# Patient Record
Sex: Female | Born: 1935
Health system: Southern US, Community
[De-identification: ages and names within clinical notes are randomized; demographics above are authoritative.]

## PROBLEM LIST (undated history)

## (undated) DIAGNOSIS — R269 Unspecified abnormalities of gait and mobility: Principal | ICD-10-CM

## (undated) DIAGNOSIS — G9389 Other specified disorders of brain: Secondary | ICD-10-CM

## (undated) DIAGNOSIS — K219 Gastro-esophageal reflux disease without esophagitis: Secondary | ICD-10-CM

## (undated) DIAGNOSIS — E079 Disorder of thyroid, unspecified: Secondary | ICD-10-CM

## (undated) DIAGNOSIS — R32 Unspecified urinary incontinence: Secondary | ICD-10-CM

## (undated) DIAGNOSIS — I639 Cerebral infarction, unspecified: Secondary | ICD-10-CM

## (undated) DIAGNOSIS — R350 Frequency of micturition: Secondary | ICD-10-CM

## (undated) DIAGNOSIS — Z8673 Personal history of transient ischemic attack (TIA), and cerebral infarction without residual deficits: Secondary | ICD-10-CM

## (undated) DIAGNOSIS — M8580 Other specified disorders of bone density and structure, unspecified site: Secondary | ICD-10-CM

## (undated) DIAGNOSIS — I10 Essential (primary) hypertension: Secondary | ICD-10-CM

## (undated) HISTORY — DX: Gastro-esophageal reflux disease without esophagitis: K21.9

## (undated) HISTORY — DX: Other specified disorders of bone density and structure, unspecified site: M85.80

## (undated) HISTORY — DX: Unspecified urinary incontinence: R32

## (undated) HISTORY — PX: CHOLECYSTECTOMY: SHX55

## (undated) HISTORY — DX: Personal history of transient ischemic attack (TIA), and cerebral infarction without residual deficits: Z86.73

## (undated) HISTORY — DX: Frequency of micturition: R35.0

## (undated) HISTORY — DX: Unspecified abnormalities of gait and mobility: R26.9

## (undated) HISTORY — PX: TONSILLECTOMY: SUR1361

## (undated) HISTORY — PX: ABDOMINAL HYSTERECTOMY: SHX81

## (undated) HISTORY — DX: Disorder of thyroid, unspecified: E07.9

## (undated) HISTORY — PX: URETHRAL SLING: SHX2621

## (undated) HISTORY — DX: Other specified disorders of brain: G93.89

## (undated) HISTORY — PX: ESOPHAGUS SURGERY: SHX626

## (undated) HISTORY — PX: BLADDER SURGERY: SHX569

## (undated) HISTORY — DX: Cerebral infarction, unspecified: I63.9

## (undated) HISTORY — PX: BACK SURGERY: SHX140

## (undated) HISTORY — DX: Essential (primary) hypertension: I10

## (undated) HISTORY — PX: APPENDECTOMY: SHX54

---

## 1999-07-01 ENCOUNTER — Encounter: Admission: RE | Admit: 1999-07-01 | Discharge: 1999-07-01 | Payer: Self-pay | Admitting: Orthopedic Surgery

## 1999-07-01 ENCOUNTER — Encounter: Payer: Self-pay | Admitting: Orthopedic Surgery

## 1999-10-26 ENCOUNTER — Encounter: Admission: RE | Admit: 1999-10-26 | Discharge: 1999-10-26 | Payer: Self-pay | Admitting: Internal Medicine

## 1999-10-26 ENCOUNTER — Encounter: Payer: Self-pay | Admitting: Internal Medicine

## 2000-05-11 ENCOUNTER — Encounter: Payer: Self-pay | Admitting: Internal Medicine

## 2000-05-11 ENCOUNTER — Encounter: Admission: RE | Admit: 2000-05-11 | Discharge: 2000-05-11 | Payer: Self-pay | Admitting: Internal Medicine

## 2000-10-29 ENCOUNTER — Encounter: Payer: Self-pay | Admitting: Internal Medicine

## 2000-10-29 ENCOUNTER — Encounter: Admission: RE | Admit: 2000-10-29 | Discharge: 2000-10-29 | Payer: Self-pay | Admitting: Internal Medicine

## 2001-07-02 ENCOUNTER — Encounter: Admission: RE | Admit: 2001-07-02 | Discharge: 2001-07-02 | Payer: Self-pay | Admitting: Internal Medicine

## 2001-07-02 ENCOUNTER — Encounter: Payer: Self-pay | Admitting: Internal Medicine

## 2001-07-04 ENCOUNTER — Encounter: Admission: RE | Admit: 2001-07-04 | Discharge: 2001-07-04 | Payer: Self-pay | Admitting: Internal Medicine

## 2001-07-04 ENCOUNTER — Encounter: Payer: Self-pay | Admitting: Internal Medicine

## 2001-08-09 ENCOUNTER — Encounter (INDEPENDENT_AMBULATORY_CARE_PROVIDER_SITE_OTHER): Payer: Self-pay

## 2001-08-09 ENCOUNTER — Ambulatory Visit (HOSPITAL_COMMUNITY): Admission: RE | Admit: 2001-08-09 | Discharge: 2001-08-09 | Payer: Self-pay | Admitting: Gastroenterology

## 2001-09-10 ENCOUNTER — Ambulatory Visit (HOSPITAL_COMMUNITY): Admission: RE | Admit: 2001-09-10 | Discharge: 2001-09-10 | Payer: Self-pay | Admitting: Interventional Cardiology

## 2001-09-11 ENCOUNTER — Encounter: Payer: Self-pay | Admitting: Thoracic Surgery

## 2001-09-13 ENCOUNTER — Encounter (INDEPENDENT_AMBULATORY_CARE_PROVIDER_SITE_OTHER): Payer: Self-pay | Admitting: Specialist

## 2001-09-13 ENCOUNTER — Encounter: Payer: Self-pay | Admitting: Thoracic Surgery

## 2001-09-13 ENCOUNTER — Inpatient Hospital Stay (HOSPITAL_COMMUNITY): Admission: RE | Admit: 2001-09-13 | Discharge: 2001-09-18 | Payer: Self-pay | Admitting: Thoracic Surgery

## 2001-09-14 ENCOUNTER — Encounter: Payer: Self-pay | Admitting: Thoracic Surgery

## 2001-09-15 ENCOUNTER — Encounter: Payer: Self-pay | Admitting: Thoracic Surgery

## 2001-09-16 ENCOUNTER — Encounter: Payer: Self-pay | Admitting: Thoracic Surgery

## 2001-09-17 ENCOUNTER — Encounter: Payer: Self-pay | Admitting: Thoracic Surgery

## 2001-09-18 ENCOUNTER — Encounter: Payer: Self-pay | Admitting: Thoracic Surgery

## 2001-09-25 ENCOUNTER — Encounter: Admission: RE | Admit: 2001-09-25 | Discharge: 2001-09-25 | Payer: Self-pay | Admitting: Thoracic Surgery

## 2001-09-25 ENCOUNTER — Encounter: Payer: Self-pay | Admitting: Thoracic Surgery

## 2001-10-23 ENCOUNTER — Encounter: Admission: RE | Admit: 2001-10-23 | Discharge: 2001-10-23 | Payer: Self-pay | Admitting: Thoracic Surgery

## 2001-10-23 ENCOUNTER — Encounter: Payer: Self-pay | Admitting: Thoracic Surgery

## 2001-11-13 ENCOUNTER — Encounter: Admission: RE | Admit: 2001-11-13 | Discharge: 2001-11-13 | Payer: Self-pay | Admitting: Thoracic Surgery

## 2001-11-13 ENCOUNTER — Encounter: Payer: Self-pay | Admitting: Thoracic Surgery

## 2001-11-27 ENCOUNTER — Encounter: Payer: Self-pay | Admitting: Internal Medicine

## 2001-11-27 ENCOUNTER — Encounter: Admission: RE | Admit: 2001-11-27 | Discharge: 2001-11-27 | Payer: Self-pay | Admitting: Internal Medicine

## 2002-04-17 ENCOUNTER — Encounter: Payer: Self-pay | Admitting: Internal Medicine

## 2002-04-17 ENCOUNTER — Encounter: Admission: RE | Admit: 2002-04-17 | Discharge: 2002-04-17 | Payer: Self-pay | Admitting: Internal Medicine

## 2002-12-04 ENCOUNTER — Encounter: Payer: Self-pay | Admitting: Internal Medicine

## 2002-12-04 ENCOUNTER — Encounter: Admission: RE | Admit: 2002-12-04 | Discharge: 2002-12-04 | Payer: Self-pay | Admitting: Internal Medicine

## 2003-10-20 ENCOUNTER — Encounter: Admission: RE | Admit: 2003-10-20 | Discharge: 2003-10-20 | Payer: Self-pay | Admitting: Internal Medicine

## 2003-12-14 ENCOUNTER — Encounter: Admission: RE | Admit: 2003-12-14 | Discharge: 2003-12-14 | Payer: Self-pay | Admitting: Internal Medicine

## 2004-11-09 ENCOUNTER — Encounter: Admission: RE | Admit: 2004-11-09 | Discharge: 2004-11-09 | Payer: Self-pay | Admitting: Internal Medicine

## 2004-11-22 ENCOUNTER — Ambulatory Visit: Payer: Self-pay | Admitting: Critical Care Medicine

## 2004-11-24 ENCOUNTER — Ambulatory Visit: Payer: Self-pay | Admitting: Critical Care Medicine

## 2004-11-24 ENCOUNTER — Ambulatory Visit: Admission: RE | Admit: 2004-11-24 | Discharge: 2004-11-24 | Payer: Self-pay | Admitting: Critical Care Medicine

## 2004-11-24 ENCOUNTER — Encounter (INDEPENDENT_AMBULATORY_CARE_PROVIDER_SITE_OTHER): Payer: Self-pay | Admitting: *Deleted

## 2004-12-07 ENCOUNTER — Ambulatory Visit: Payer: Self-pay | Admitting: Critical Care Medicine

## 2005-01-06 ENCOUNTER — Ambulatory Visit: Payer: Self-pay | Admitting: Critical Care Medicine

## 2005-02-07 ENCOUNTER — Ambulatory Visit (HOSPITAL_COMMUNITY): Admission: RE | Admit: 2005-02-07 | Discharge: 2005-02-07 | Payer: Self-pay | Admitting: Critical Care Medicine

## 2005-02-14 ENCOUNTER — Encounter: Admission: RE | Admit: 2005-02-14 | Discharge: 2005-02-14 | Payer: Self-pay | Admitting: Internal Medicine

## 2005-03-07 ENCOUNTER — Ambulatory Visit: Payer: Self-pay | Admitting: Critical Care Medicine

## 2006-02-15 ENCOUNTER — Encounter: Admission: RE | Admit: 2006-02-15 | Discharge: 2006-02-15 | Payer: Self-pay | Admitting: Internal Medicine

## 2007-03-26 ENCOUNTER — Encounter: Admission: RE | Admit: 2007-03-26 | Discharge: 2007-03-26 | Payer: Self-pay | Admitting: Internal Medicine

## 2007-04-08 ENCOUNTER — Encounter: Admission: RE | Admit: 2007-04-08 | Discharge: 2007-04-08 | Payer: Self-pay | Admitting: Internal Medicine

## 2007-10-28 ENCOUNTER — Ambulatory Visit (HOSPITAL_COMMUNITY): Admission: RE | Admit: 2007-10-28 | Discharge: 2007-10-28 | Payer: Self-pay | Admitting: Gastroenterology

## 2007-10-28 ENCOUNTER — Encounter (INDEPENDENT_AMBULATORY_CARE_PROVIDER_SITE_OTHER): Payer: Self-pay | Admitting: Diagnostic Radiology

## 2008-02-13 ENCOUNTER — Inpatient Hospital Stay (HOSPITAL_COMMUNITY): Admission: RE | Admit: 2008-02-13 | Discharge: 2008-02-14 | Payer: Self-pay | Admitting: Obstetrics and Gynecology

## 2008-04-02 ENCOUNTER — Encounter: Admission: RE | Admit: 2008-04-02 | Discharge: 2008-04-02 | Payer: Self-pay | Admitting: Internal Medicine

## 2008-08-21 HISTORY — PX: BLADDER SURGERY: SHX569

## 2008-08-21 HISTORY — PX: URETHRAL SLING: SHX2621

## 2009-04-05 ENCOUNTER — Encounter: Admission: RE | Admit: 2009-04-05 | Discharge: 2009-04-05 | Payer: Self-pay | Admitting: Internal Medicine

## 2009-07-21 ENCOUNTER — Ambulatory Visit (HOSPITAL_BASED_OUTPATIENT_CLINIC_OR_DEPARTMENT_OTHER): Admission: RE | Admit: 2009-07-21 | Discharge: 2009-07-21 | Payer: Self-pay | Admitting: Urology

## 2010-03-10 ENCOUNTER — Encounter: Admission: RE | Admit: 2010-03-10 | Discharge: 2010-03-10 | Payer: Self-pay | Admitting: Internal Medicine

## 2010-04-27 ENCOUNTER — Encounter: Admission: RE | Admit: 2010-04-27 | Discharge: 2010-04-27 | Payer: Self-pay | Admitting: Internal Medicine

## 2010-09-11 ENCOUNTER — Encounter: Payer: Self-pay | Admitting: Family Medicine

## 2010-11-22 LAB — POCT I-STAT 4, (NA,K, GLUC, HGB,HCT): Potassium: 4 mEq/L (ref 3.5–5.1)

## 2011-01-03 NOTE — Op Note (Signed)
Margaret Francis, Margaret Francis                ACCOUNT NO.:  [id number]   MEDICAL RECORD NO.:  [id number]          PATIENT TYPE:  INP   LOCATION:  9305                          FACILITY:  [facility]   PHYSICIAN:  Charles A. Delcambre, MDDATE OF BIRTH:  01/12/1930   DATE OF PROCEDURE:  02/13/2008  DATE OF DISCHARGE:                               OPERATIVE REPORT   PREOPERATIVE DIAGNOSES:  1. Cystocele.  2. Rectocele.   POSTOPERATIVE DIAGNOSES:  1. Cystocele.  2. Rectocele.   PROCEDURE:  Anterior and posterior repair.   SURGEON:  Charles A. Sydnee Cabal, MD   ASSISTANT:  Gerald Leitz, MD   COMPLICATIONS:  None.   ESTIMATED BLOOD LOSS:  150 mL.   ANESTHESIA:  General.   FINDINGS:  Large cystocele and large rectocele noted.   Instrument, sponge, and needle count correct x2.   DESCRIPTION OF PROCEDURE:  The patient was taken to the operating room  and anesthesia was given and then was adequate.  She was then placed in  dorsal lithotomy position in universal stirrups.  Sterile prep and drape  was undertaken.  Cuff was isolated with an Allis clamps and pulled down  retraction was used.  The cuff was scored with a knife.  Vaginal mucosa  was then dissected free of the cystocele with some sharp dissection with  a knife and Metzenbaum scissors and some blunt dissection with Ray-Tec  on the finger.  Adequate exposure was noted, 2-0 Vicryl sutures and a  pubourethral ligament were placed.  Successive sutures x5 were placed  inferior to the urethral meatus down towards the apex of the vagina.  These were plicated and yielded very good support of the cystocele as  the cystocele was reduced tying the sutures down, excess vaginal mucosa  was excised, running locking 2-0 Vicryl was then used to close the  vaginal mucosa.  Attention was then turned to the posterior repair.  A  wedge diamond-shaped tissue was excised from the perineal body.  This  allowed exposure of the vaginal mucosa overlying the rectum.   Large  rectocele was encountered with the septum being very thin.  Careful  dissection with a knife and Metzenbaum scissors and some blunt  dissection with the Ray-Tec over the finger yielded good exposure.  Four  2-0 Vicryl sutures were used to plicate the perirectal fascia to the  midline.  Hemostasis was good and structural support was adequately  obtained.  Excess vaginal mucosa was excised as the patient is not  sexually active.  The vaginal  mucosa was closed with running locking 2-0 Vicryl.  Hemostasis was  adequate.  After a figure-of-eight suture was placed at the introitus, a  pack of 1-inch gauze was done with Estrace cream.  The patient was taken  to recovery with physician in attendance having tolerated the procedure  well.      Charles A. Sydnee Cabal, MD  Electronically Signed     CAD/MEDQ  D:  02/13/2008  T:  02/14/2008  Job:  [id number]

## 2011-01-06 NOTE — H&P (Signed)
[place]. [facility]  Patient:    Margaret Francis, Margaret Francis Visit Number: [id number] MRN: [id number]          Service Type: SUR Location: 3300 3310 01 Attending Physician:  Cameron Proud Dictated by:   Sherrie George, P.A. Admit Date:  09/13/2001                           History and Physical  REFERRING PHYSICIAN:  Dr. Barbette Hair. Vaughan Basta.  CHIEF COMPLAINT:  Leiomyoma of the esophagus.  BRIEF HISTORY:  Patient is a 77 year old white female with a history of gastroesophageal reflux disease and chronic chest discomfort.  She underwent chest CT which showed a 2.4-cm density in the left posterior mediastinum which was thought to be an esophageal or bronchogenic cyst.  Endoscopy showed a 3-cm tumor in the midesophagus consistent with a leiomyoma or a lipoma.  She was referred to [facility] and Dr. Algis Downs. Karle Plumber and it was his opinion she should undergo resection of this leiomyoma.  The risks and benefits were discussed and she is admitted for the same at this time.  ALLERGIES:  She has taken MORPHINE and DEMEROL in relation to in-hospital procedures; both of them made her violently sick with vomiting.  PRESENT MEDICATIONS: 1. Verapamil 120 mg one q.d. h.s. 2. Synthroid 50 mcg q.d. 3. Nexium 40 mg q.d. 4. Premarin 1.25 mg q.d. 5. Aspirin 81 mg q.d.  PAST HISTORY:  She had a hysterectomy 20 years ago, cholecystectomy in 1990s, appendectomy in 1950s, tonsils in 1960s, hypertension for 10 to 15 years.  She has a history of being hypothyroid.  She had a cardiac catheterization, September 10, 2001, by Dr. Darci Needle; those results are not currently available.  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 Diet:  She is on a regular diet.  Cigarettes:  She smoked less than a pack a day for four to five years during college, none since.  ETOH:  None.  FAMILY HISTORY:  Mother died at 47 with diabetes and heart failure.   Father died at age 33 with rheumatoid arthritis and heart failure.  She has one sister with breast cancer.  There is also a history of diabetes and heart disease within her family.  REVIEW OF SYSTEMS:  Weight is stable.  Left hand gets numb secondary to a bulging disk/spurs in her neck.  She sleeps on three pillows secondary to her reflux.  She denies any cardiac symptoms, GI symptoms, GU symptoms.  Left hip is "bad" and is being followed by Dr. Ollen Gross.  PHYSICAL EXAMINATION:  GENERAL:  This is a well-nourished, well-developed white female in no acute distress.  VITAL SIGNS:  Pulse is 84 and regular.  Blood pressure is 170/90 in the right and 170/92 in the left.  HEENT:  Head:  Normocephalic.  Eyes:  PERLA, EOMs intact.  Fundi not visualized.  Ears, nose, throat and mouth:  Grossly within normal limits.  The left TM could not be seen secondary to cerumen.  NECK:  No bruits, no lymphadenopathy and no thyromegaly.  CHEST:  Clear to auscultation and percussion bilaterally.  HEART:  No murmurs, rubs, or gallops.  Pulses are +2 and equal bilaterally throughout their distribution.  ABDOMEN:  Soft, nontender.  Positive bowel sounds.  No palpable organomegaly.  GU/RECTAL:  Not performed, not medically indicated.  EXTREMITIES:  No clubbing, cyanosis, or edema.  NEUROLOGIC:  No focal deficits.  IMPRESSION: 1. Leiomyoma of the esophagus. 2. Hypertension. 3. Hypothyroidism. 4. Chronic gastroesophageal reflux disease.  PLAN:  Right thoracotomy, VATS resection of leiomyoma. Dictated by:   Sherrie George, P.A. Attending Physician:  Cameron Proud DD:  09/11/01 TD:  09/12/01 Job: [id number]

## 2011-01-06 NOTE — Cardiovascular Report (Signed)
[place]. [facility]  Patient:    Margaret Francis, Margaret Francis Visit Number: [id number] MRN: [id number]          Service Type: CAT Location: [facility] 2899 12 Attending Physician:  Lyn Records. Iii Dictated by:   Darci Needle, M.D. Proc. Date: 09/10/01 Admit Date:  09/10/2001   CC:         Barbette Hair. Vaughan Basta., M.D.  D. Karle Plumber, M.D.   Cardiac Catheterization  INDICATIONS:  The patient is 76 and has been having various types of chest discomfort.  She recently underwent a pre-esophageal surgery Cardiolite stress test that demonstrated inferolateral/apical ischemia.  Because of this abnormality, coronary angiography was felt to be indicated prior to esophageal surgery for an esophageal lipoma.  PROCEDURE PERFORMED: 1. Left heart catheterization. 2. Selective coronary angiography. 3. Left ventriculography.  DESCRIPTION OF PROCEDURE:  After informed consent, a #6 French sheath was inserted into the right femoral artery using the modified Seldinger technique. A #6 French A2 Multipurpose catheter was used for hemodynamic recordings, left ventriculography, and selective left and right coronary angiography.  No complications occurred during the procedure.  A sheathogram was performed in the right femoral artery revealing a sheath.  Perclose was applied successfully.  RESULTS:    I. Left ventriculography:  The left ventricle was normal in size and      demonstrates normal overall contractility.  The ejection fraction is      estimated to be 65%.  The patient had no mitral regurgitation.  II. Hemodynamic data      A. Aortic pressure 169/79.      B. Left ventricular pressure 170/12. III. Selective coronary angiography      A. Left main coronary:  Normal.      B. Left anterior descending coronary:  Normal.      C. Circumflex artery:  Normal.      D. Right coronary:  Normal.  The coronary arterial blood supply distribution is normal with the LAD  being dominant wrapping around the left ventricular apex.  It gives origin to several small diagonal branches.  There is a very large first obtuse marginal branch that arises from the circumflex that also supplies much of the anterolateral wall.  The distal circumflex bifurcates and is also normal.  CONCLUSIONS: 1. Essentially normal coronaries with only minimal luminal irregularities    noted in the LAD. 2. Normal left ventricular function. 3. Successful Perclose.  PLAN:  No specific cardiovascular therapy is indicated.  It is doubtful the patients chest discomfort is cardiac in origin.  She is cleared for esophageal surgery. Dictated by:   Darci Needle, M.D. Attending Physician:  Lyn Records. Iii DD:  09/10/01 TD:  09/10/01 Job: [id number]

## 2011-01-06 NOTE — Op Note (Signed)
Margaret Francis, Margaret Francis                ACCOUNT NO.:  [id number]   MEDICAL RECORD NO.:  [id number]          PATIENT TYPE:  AMB   LOCATION:  CARD                         FACILITY:  [facility]   PHYSICIAN:  Shan Levans, M.D. LHCDATE OF BIRTH:  03-27-1930   DATE OF PROCEDURE:  11/24/2004  DATE OF DISCHARGE:                                 OPERATIVE REPORT   PROCEDURE:  Bronchoscopy.   SURGEON:  Shan Levans, M.D.   INDICATIONS FOR PROCEDURE:  Chronic aspiration with right lower lobe  cavitary infiltrate.   ANESTHESIA:  1% Xylocaine local.   PREOP MEDICATION:  Fentanyl 50 mcg IV push, Versed 3 mg IV push.   DESCRIPTION OF PROCEDURE:  The Pentax video bronchoscope was introduced in  the right nares, the upper airways were visualized and were unremarkable.  The entire tracheobronchial tree was visualized and revealed diffuse  tracheobronchitis with significant airway inflammation and purulence but no  evidence of significant endobronchial lesion. Attention was then paid to the  right lower lobe superior segment, __________ protective brushings were  obtained, bronchioalveolar lavage was obtained, 150 mL infused, 75 mL  returned. Transbronchial biopsies x5 were obtained.   COMPLICATIONS:  None.   IMPRESSION:  Probable aspiration pneumonia with cavitary pneumonitis. Rule  out fungal infection. Rule out malignancy.   RECOMMENDATIONS:  Followup microbiologic data, pathology as well.      PW/MEDQ  D:  11/24/2004  T:  11/24/2004  Job:  [id number]   cc:   Ike Bene, M.D.  [address]  [place]  [place] [address]  Fax: [phone]

## 2011-01-06 NOTE — H&P (Signed)
[place]. [facility]  Patient:    Margaret Francis, Margaret Francis Visit Number: [id number] MRN: [id number]          Service Type: SUR Location: 3300 3310 01 Attending Physician:  Cameron Proud Dictated by:   Anselm Lis, N.P. Admit Date:  09/13/2001                           History and Physical  DATE OF BIRTH:  06-May-1930  PRIMARY CARE [place]:  Dr. Barbette Hair. Vaughan Basta.  DATE OF PROCEDURE:  September 10, 2001  IMPRESSION: 1. Atypical chest pain; followup Cardiolite was abnormal and suggestive of    ischemia.  She was counseled for coronary angiography and possible    percutaneous coronary intervention.  Cardiac risk factors include    hypertension, age and positive family history for coronary artery disease    (late in life). 2. Hypothyroidism; supplemented. 3. Hypertension for many years. 4. Benign esophageal tumor (lipoma), scheduled to be resected by    Dr. Norton Blizzard, Friday, January 24th, toward preventing eventual    esophageal obstruction from continued growth. 5. History of gastroesophageal reflux disease; esophagogastroduodenoscopy,    December 2002, revealed gastric atrophy and evidence of esophageal lipoma.    Helicobacter pylori negative.  She is on Nexium. 6. History of back surgery x2 (1970s, repair of ruptured lumbar disks).  She    currently has bulging cervical disk/cervical bone spurs, ? cause of left    arm radiculopathy (intermittent left arm/hand numbness).  PLAN:  Patient was counseled to undergo and accepted plans for coronary angiography, left ventriculogram and possible percutaneous intervention if indicated and able.  Risks, potential complications such as risk of MI, coronary artery perforation, emergent CABG, stroke, bleeding/bruising in groin site; benefits and alternatives reviewed.  Patient and her husband indicate their questions and concerns have been addressed and are agreeable to proceed.  HISTORY OF  PRESENT ILLNESS:  Margaret Francis is a pleasant 77 year old with history of hypertension, positive family history of late-in-life CAD, who has had a six-month history of almost constant (90% of the time) lower substernal/epigastric dull pain, hard achy discomfort radiating to her back. No exacerbating or relieving factors.  EGD, December 2002, was unrevealing, though did identify a lipoma that is due to be resected this Friday, September 13, 2001, by Dr. Edwyna Shell.  Patients discomfort waxes/wanes.  No relation to exertion; is non-pleuritic nor reproduced with applied pressure. Workup has included adenosine Cardiolite suspicious for ischemia.  She was counseled for coronary angiography and possible percutaneous intervention.  PREVIOUS SURGICAL HISTORY:  Repair or deviated nasal septum, lumbar back surgery in 1970s x2 (repair of ruptured disks), appendectomy, cholecystectomy, tonsillectomy, hysterectomy with BSO, left knee arthroscopic repair of ruptured ligaments.  ALLERGIES:  To MORPHINE and DEMEROL causing emesis.  MEDICATIONS: 1. Verapamil 120 mg q.h.s. 2. Baby aspirin (on hold pending surgery this Friday). 3. Nexium 40 mg p.o. q.a.m. 4. Drixoral (over-the-counter allergy medication) q.d.  SOCIAL HISTORY AND HABITS:  ETOH/tobacco:  Negative.  Caffeine:  Negative. Patient is retired Psychologist, forensic of business for 30 years.  She is married, for 48 years, without children.  FAMILY HISTORY:  Mother died at age 63, ? heart failure.  Dad died at age 67, had rheumatoid arthritis.  Patient has two sisters alive and well, older sister is status post mastectomy for breast cancer.  Maternal grandfather died at age 77 of MI, maternal aunt had  heart disease/defibrillator.  REVIEW OF SYSTEMS:  As in HPI/previous medical history, otherwise, denies problems with hard of hearing, wears trifocals.  Negative dysphagia to food or fluid.  Has a postnasal drip.  Negative constipation, diarrhea, bright  red blood PR, dysuria nor hematuria.  Does have a "bad left hip" which prevents her from running.  Negative pedal edema, DOE, orthopnea nor PND.  PHYSICAL EXAMINATION:  VITAL SIGNS:  Blood pressure 157/85, heart rate 72 and regular, respiratory rate 16, temperature 98.1.  Height 5 feet 7 inches.  Weight 165 pounds.  GENERAL:  She is a well-nourished, older female in no apparent distress.  Her husband is in attendance.  HEENT/NECK:  Brisk bilateral carotid upstrokes without bruit.  No significant JVD nor thyromegaly.  CHEST:  Lung sounds clear with equal bilateral excursion.  Negative CVA tenderness.  CARDIAC:  Regular rate and rhythm without murmur, rub nor gallop.  ABDOMEN:  Thin, soft, nondistended, normoactive bowel sounds.  Negative abdominal aortic, renal nor femoral bruit.  No masses nor organomegaly identified.  EXTREMITIES:  Distal pulses intact.  Negative pedal edema.  NEUROLOGIC:  Cranial nerves II-XII grossly intact.  Alert and oriented x3.  GENITAL:  Deferred.  RECTAL:  Deferred.  LABORATORY AND ACCESSORY DATA:  Lab work from September 09, 2001 revealed sodium of 137, potassium 5.2, chloride 101, CO2 27, BUN of 10, creatinine 0.8, glucose 96; LFTs within normal range; hemoglobin 12.6 (was 14.7 on August 29, 2001), hematocrit of 37.2, WBC 5.9, platelets are 229,000; pro time of 10.9, INR of 0.85, PTT of 35.  Chest x-ray from April 16, 2001 revealed no active disease.  EKG revealed 70 beats per minute, T-wave abnormalities in aVF, V4 through V6. Dictated by:   Anselm Lis, N.P. Attending Physician:  Cameron Proud DD:  09/10/01 TD:  09/10/01 Job: [id number]

## 2011-01-06 NOTE — Discharge Summary (Signed)
[place]. [facility]  Patient:    Margaret Francis, Margaret Francis Visit Number: [id number] MRN: [id number]          Service Type: SUR Location: 3300 3310 01 Attending Physician:  Cameron Proud Dictated by:   Areta Haber, P.A.-C Admit Date:  09/13/2001 Disc. Date: 09/18/01   CC:         Norton Blizzard, M.D.  Barbette Hair. Vaughan Basta., M.D.   Discharge Summary  HISTORY OF PRESENT ILLNESS:  This is a 77 year old white female with a history of gastroesophageal reflux disease and chronic chest discomfort.  She underwent a chest CT scan which showed a 2.4 cm density in the left posterior mediastinum which was thought to be possibly an esophageal or bronchiogenic cyst.  Endoscopic examination showed a 3 cm tumor in the mid esophagus consistent with a leiomyoma or a lipoma.  She was referred to Dr. Algis Downs. Karle Plumber for a surgical opinion, and he admitted her this hospitalization for elective resection of the lesion.  ALLERGIES:  She has taken MORPHINE and DEMEROL in relation to in-hospital procedures.  Both of them reportedly made her violently sick with vomiting.  CURRENT MEDICATIONS: 1. Verapamil 120 mg q.d. h.s. 2. Synthroid 50 mcg q.d. 3. Nexium 40 mg q.d. 4. Premarin 1.25 mg q.d. 5. Aspirin 81 mg q.d.  PAST MEDICAL HISTORY: 1. Hysterectomy. 2. Cholecystectomy. 3. Appendectomy. 4. Tonsillectomy. 5. History of hypertension. 6. History of hypothyroidism. 7. History of recent cardiac catheterization on September 10, 2001.  Results    currently unavailable to the chart.  SOCIAL HISTORY:  Please see the history and physical done at the time of admission.  FAMILY HISTORY:  Please see the history and physical done at the time of admission.  REVIEW OF SYSTEMS:  Please see the history and physical done at the time of admission.  PHYSICAL EXAMINATION:  Please see the history and physical done at the time of admission.  HOSPITAL COURSE:  The  patient was admitted electively on 09/13/01.  Taken to the operating room where she underwent a right video assisted thoracoscopy with resection of an esophageal duplication cyst.  The procedure was performed by Dr. Algis Downs. Karle Plumber.  The patient tolerated the procedure well, was taken to the postanesthesia care unit in a stable condition.  Additionally it was noted the patient had a removal of a skin nevus at that time, and a lymph node dissection.  POSTOPERATIVE HOSPITAL COURSE:  The patient has done well.  She has been restarted on her routine medications.  She has tolerated routine advancement and activity.  Commenced her for the level of postoperative convalescence using routine advancement protocols.  Laboratory values have been stable postoperatively.  Hemoglobin and hematocrit dated 09/16/01, were 12 and 35, respectively.  She had her chest tubes removed in a routine manner without problem.  Her chest x-rays were evaluated daily, and are felt to be stable and improving.  She has been weaned from oxygen and maintains good saturations on room air.  She has had a post-procedure Gastrografin swallow which has shown no extravasation of the dye.  She is showing good signs of healing in regards to her incisions without signs of infection.  She is overall felt to be tentatively stable for discharge in the morning of 09/18/01.  FINAL DIAGNOSES: 1. Esophageal cyst, benign cyst line by respiratory type epithelium and    associated histiocytic reaction. 2. Lymph node level 8 biopsy benign with sinus histiocytosis and anthracotic  pigment. 3. Lymph node level #8 number 2, biopsy benign lymph node with sinus    histiocytosis. 4. Skin biopsy of skin nevus showed melanocytic nevus.  DISCHARGE MEDICATIONS:  As preoperatively for pain, Ultram one or two q.6h. p.r.n.  DISCHARGE INSTRUCTIONS:  The patient received written instructions in regards to medications, activity, diet, wound care, and  followup.  FOLLOWUP:  Dr. Edwyna Shell in one week with a chest x-ray at that time.  FINAL DIAGNOSES:  Benign esophageal cyst.  OTHER DIAGNOSES:  As previously listed. 1. Previous hysterectomy. 2. Cholecystectomy. 3. Appendectomy. 4. Tonsillectomy. 5. History of hypertension. 6. History of hypothyroidism. 7. History of gastroesophageal reflux. Dictated by:   Areta Haber, P.A.-C Attending Physician:  Cameron Proud DD:  09/17/01 TD:  09/17/01 Job: [id number]

## 2011-01-06 NOTE — Op Note (Signed)
[place]. [facility]  Patient:    Margaret Francis, Margaret Francis Visit Number: [id number] MRN: [id number]          Service Type: SUR Location: 3300 3310 01 Attending Physician:  Cameron Proud Dictated by:   D. Karle Plumber, M.D. Proc. Date: 09/13/01 Admit Date:  09/13/2001   CC:         Genene Churn. Sherin Quarry, M.D.             Barbette Hair. Vaughan Basta., M.D.                           Operative Report  PREOPERATIVE DIAGNOSIS:  Mid esophageal mass.  POSTOPERATIVE DIAGNOSIS:  Esophageal duplication cyst mid esophagus.  PROCEDURE:  Right VATS ______ of esophageal duplication cyst.  SURGEON:  D. Karle Plumber, M.D.  ASSISTANT:  Tollie Pizza. Collins, P.A.-C.  ANESTHESIA:  General.  DESCRIPTION OF PROCEDURE:  After percutaneous insertion of all monitor lines the patient under general anesthesia was prepped and draped in the usual sterile manner.  She was turned to the right lateral thoracotomy position.  Two trocar sites were made in the anterior and posterior axial line at the 7th intercostal space.  Two trocars were insert.  Through the anterior trocar site a 2-S ______ ring forcep was inserted to reflect the lung medially and a 30 degree scope was inserted through the posterior trocar site which saw a cystic structure just right below the carina.  A 5 cm incision was made over the triangle of auscultation.  Dissection was carried down through the subcutaneous tissue and the latissimus was partially divided.  The 6th intercostal space was entered and small ______ was placed in the 6th intercostal space.  Dissection was started on the esophagus and subcarinal areas dissecting the pleura off the esophagus and then dissecting the esophagus up inferiorly to get around the esophagus inferiorly and then looping it with a Penrose drain.  Then the muscle was split and the cyst was identified.  There was a large, at least 5 x 3, 3-1/2 cm cyst and the muscle was reflected  laterally and medially to identify the cyst, some of the cyst contents were removed and sent for culture.  Then the cyst was resected off of the esophagus which was intimately involved with the mucosa with sharp dissection.  After the cyst had been removed the area was irrigated copiously.  No leak was seen in the mucosa.  The muscle was reapproximated with interrupted 3-0 Vicryl sutures and pleural was closed over the esophagus with interrupted 3-0 Vicryl sutures.  Two chest tubes were placed.  The chest was closed with 2 pericostals of 1 Vicryl in the muscle layer with 2-0 Vicryl in the subcutaneous tissue and a subcuticular stitch of 3-0 Vicryl.  The patient tolerated the procedure well and was transported to recovery room in stable condition. Dictated by:   D. Karle Plumber, M.D. Attending Physician:  Cameron Proud DD:  09/13/01 TD:  09/13/01 Job: [id number]

## 2011-04-19 ENCOUNTER — Encounter: Payer: Self-pay | Admitting: Obstetrics & Gynecology

## 2011-04-19 ENCOUNTER — Ambulatory Visit (INDEPENDENT_AMBULATORY_CARE_PROVIDER_SITE_OTHER): Payer: Medicare Other | Admitting: Obstetrics & Gynecology

## 2011-04-19 VITALS — BP 152/87 | HR 77 | Ht 65.0 in | Wt 201.0 lb

## 2011-04-19 DIAGNOSIS — R350 Frequency of micturition: Secondary | ICD-10-CM

## 2011-04-19 HISTORY — DX: Frequency of micturition: R35.0

## 2011-04-19 LAB — POCT URINALYSIS DIPSTICK
Bilirubin, UA: NEGATIVE
Blood, UA: NEGATIVE
Glucose, UA: NEGATIVE
Ketones, UA: NEGATIVE
Nitrite, UA: NEGATIVE
Protein, UA: NEGATIVE
Spec Grav, UA: 1.01
Urobilinogen, UA: NEGATIVE
pH, UA: 6.5

## 2011-04-19 MED ORDER — DARIFENACIN HYDROBROMIDE ER 7.5 MG PO TB24: 7.5000 mg | ORAL_TABLET | Freq: Every day | ORAL | Status: AC

## 2011-04-19 NOTE — Patient Instructions (Signed)
Urinary Frequency The number of times a normal person urinates depends upon how much liquid they take in and how much liquid they are losing. If the temperature is hot and there is high humidity then the person will sweat more and usually breathe a little more frequently. These factors decrease the amount of frequency of urination that would be considered normal. The amount you drink is easily determined, but the amount of fluid lost is sometimes more difficult to calculate.  Fluid is lost in two ways:  Sensible fluid loss is usually measured by the amount of urine that you get rid of. Losses of fluid can also occur with diarrhea.   Insensible fluid loss is more difficult to measure. It is caused by evaporation. Insensible loss of fluid occurs through breathing and sweating. It usually ranges from a little less than a quart to a little more than a quart of fluid a day.  In normal temperatures and activity levels the average person may urinate 4 to 7 times in a 24-hour period. Needing to urinate more often than that could indicate a problem. If one urinates 4 to 7 times in 24 hours and has large volumes each time, that could indicate a different problem from one who urinates 4 to 7 times a day and has small volumes. The time of urinating is also an important. Most urinating should be done during the waking hours. Getting up at night to urinate frequently can indicate some problems. CAUSES The bladder is the organ in your lower abdomen that holds urine. Like a balloon, it swells some as it fills up. Your nerves sense this and tell you it is time to head for the bathroom. There are a number of reasons that you might feel the need to urinate more often than usual. They include:  Urinary tract infection. This is usually associated with other signs such as burning when you urinate.   In men, problems with the prostate (a walnut-size gland that is located near the tube that carries urine out of your body).  There are two reasons why the prostate can cause an increased frequency of urination:   An enlarged prostate that does not let the bladder empty well. If the bladder only half empties when you urinate then it only has half the capacity to fill before you have to urinate again.   The nerves in the bladder become more hypersensitive with an increased size of the prostate even if the bladder empties completely.   Pregnancy.   Obesity. Excess weight is more likely to cause a problem for women more than for men.   Bladder stones or other bladder problems.   Caffeine.   Alcohol.   Medications. For example, drugs that help the body get rid of extra fluid (diuretics) increase urine production. Some other medicines must be taken with lots of fluids.   Muscle or nerve weakness. This might be the result of a spinal cord injury, a stroke, multiple sclerosis or Parkinson's disease.   Long-standing diabetes can decrease the sensation of the bladder. This loss of sensation makes it harder to sense the bladder needs to be emptied. Over a period of years the bladder is stretched out by constant overfilling. This weakens the bladder muscles so that the bladder does not empty well and has less capacity to fill with new urine.   Interstitial cystitis (also called painful bladder syndrome). This condition develops because the tissues that line the insider of the bladder are inflamed (inflammation  is the body's way of reacting to injury or infection). It causes pain and frequent urination. It occurs in women more often than in men.  DIAGNOSIS  To decide what might be causing your urinary frequency, your healthcare provider will probably:   Ask about symptoms you have noticed.   Ask about your overall health. This will include questions about any medications you are taking.   Do a physical examination.   Order some tests. These might include:   A blood test to check for diabetes or other health issues  that could be contributing to the problem.   Urine testing. This could measure the flow of urine and the pressure on the bladder.   A test of your neurological system (the brain, spinal cord and nerves). This is the system that senses the need to urinate.   A bladder test to check whether it is emptying completely when you urinate.   Cytoscopy. This test uses a thin tube with a tiny camera on it. It offers a look inside your urethra and bladder to see if there are problems.   Imaging tests. You might be given a contrast dye and then asked to urinate. X-rays are taken to see how your bladder is working.  TREATMENT It is important for you to be evaluated to determine if the amount or frequency that you have is unusual or abnormal. If it is found to be abnormal the cause should be determined and this can usually be found out easily. Depending upon the cause treatment could include medication, stimulation of the nerves, or surgery. There are not too many things that you can do as an individual to change your urinary frequency. It is important that you balance the amount of fluid intake needed to compensate for your activity and the temperature. Medical problems will be diagnosed and taken care of by your physician. There is no particular bladder training such as Kegel's exercises that you can do to help urinary frequency. This is an exercise this is usually done for people who have leaking of urine when they laugh cough or sneeze. HOME CARE INSTRUCTIONS  Take any medications your healthcare provider prescribed or suggested. Follow the directions carefully.   Practice any lifestyle changes that are recommended. These might include:   Drinking less fluid or drinking at different times of the day. If you need to urinate often during the night, for example, you may need to stop drinking fluids early in the evening.   Cutting down on caffeine or alcohol. They both can make you need to urinate more often  than normal. Caffeine is found in coffee, tea and sodas.   Losing weight, if that is recommended.   Keep a journal or a log. You might be asked to record how much you drink and when and when you feel the need to urinate. This will also help evaluate how well the treatment provided by your physician is working.  SEEK MEDICAL CARE IF:  Your need to urinate often gets worse.   You feel increased pain or irritation when you urinate.   You notice blood in your urine.   You have questions about any medications that your healthcare provider recommended.   You notice blood, pus or swelling at the site of any test or treatment procedure.   You develop a fever of more than 100.62F (38.1C).  SEEK IMMEDIATE MEDICAL CARE IF:  You develop a fever of more than 102.0 F (38.9C).  Document Released: 06/03/2009 Document  Re-Released: 08/29/2009 Exit[employer] Patient Information 2011 [place], [place].

## 2011-04-19 NOTE — Progress Notes (Signed)
  Subjective:    Patient ID: Margaret Francis, female    DOB: [birth year], 77 y.o.   MRN: [id number]  HPI Increase urinary frequency for 3 weeks. Recently treated for UTI. She is a patient of Dr. Retta Diones in [place]. He performed a suburethral sling in 2010 with dgood result   She tried ditropan for frequency but she had blurred vision so she discontinued Past Medical History  Diagnosis Date  . Hypertension   . Thyroid disease   . Urinary frequency 04/19/2011   Past Surgical History  Procedure Date  . Abdominal hysterectomy   . Bladder surgery 2010  . Urethral sling 2010  . Back surgery     x2  . Appendectomy   . Cholecystectomy   . Tonsillectomy     at age32  . Esophagus surgery     tumor removal.   Current outpatient prescriptions:aspirin 81 MG tablet, Take 81 mg by mouth daily.  , Disp: , Rfl: ;  levothyroxine (SYNTHROID, LEVOTHROID) 50 MCG tablet, Take 50 mcg by mouth daily.  , Disp: , Rfl: ;  Omeprazole (PRILOSEC PO), Take by mouth.  , Disp: , Rfl: ;  valsartan (DIOVAN) 320 MG tablet, Take 320 mg by mouth daily.  , Disp: , Rfl:   Review of Systems   Genito-Urinary ROS: positive for - urinary frequency/urgency, nocturia  Objective:   Physical Exam  NAD, pleasant, PE deferred  sm leuk on UA    Assessment & Plan:  Urinary frequency H/O SUI Urine culture Enablex 7.5 mg po daily F/U with Dr. Retta Diones Sept

## 2011-04-19 NOTE — Progress Notes (Signed)
Addended by: Barbara Cower on: 04/19/2011 12:26 PM   Modules accepted: Orders

## 2011-05-15 LAB — CBC
HCT: 40.6
MCV: 91.6
RBC: 4.43
WBC: 7.3

## 2011-05-15 LAB — PROTIME-INR: Prothrombin Time: 11.9

## 2011-05-18 LAB — COMPREHENSIVE METABOLIC PANEL
ALT: 65 — ABNORMAL HIGH
Albumin: 2.7 — ABNORMAL LOW
Albumin: 3.8
BUN: 16
BUN: 9
CO2: 30
Calcium: 9
Calcium: 9.8
Creatinine, Ser: 0.69
GFR calc non Af Amer: >60
Glucose, Bld: 100 — ABNORMAL HIGH
Glucose, Bld: 108 — ABNORMAL HIGH
Potassium: 3.6
Sodium: 134 — ABNORMAL LOW
Total Bilirubin: 0.8
Total Protein: 5.1 — ABNORMAL LOW
Total Protein: 6.8

## 2011-05-18 LAB — CBC
HCT: 39.9
Hemoglobin: 11.3 — ABNORMAL LOW
MCV: 93.8
Platelets: 165
RBC: 4.31

## 2011-05-23 ENCOUNTER — Other Ambulatory Visit: Payer: Self-pay | Admitting: Internal Medicine

## 2011-05-23 DIAGNOSIS — Z1231 Encounter for screening mammogram for malignant neoplasm of breast: Secondary | ICD-10-CM

## 2011-05-25 ENCOUNTER — Other Ambulatory Visit: Payer: Self-pay | Admitting: Internal Medicine

## 2011-05-25 DIAGNOSIS — R42 Dizziness and giddiness: Secondary | ICD-10-CM

## 2011-06-01 ENCOUNTER — Ambulatory Visit
Admission: RE | Admit: 2011-06-01 | Discharge: 2011-06-01 | Disposition: A | Discharge status: Home / Self Care | Payer: Medicare Other | Source: Ambulatory Visit | Attending: Internal Medicine | Admitting: Internal Medicine

## 2011-06-01 DIAGNOSIS — R42 Dizziness and giddiness: Secondary | ICD-10-CM

## 2011-06-29 ENCOUNTER — Ambulatory Visit
Admission: RE | Admit: 2011-06-29 | Discharge: 2011-06-29 | Disposition: A | Discharge status: Home / Self Care | Payer: Medicare Other | Source: Ambulatory Visit | Attending: Internal Medicine | Admitting: Internal Medicine

## 2011-06-29 DIAGNOSIS — Z1231 Encounter for screening mammogram for malignant neoplasm of breast: Secondary | ICD-10-CM

## 2012-07-29 ENCOUNTER — Other Ambulatory Visit: Payer: Self-pay | Admitting: Internal Medicine

## 2012-07-29 DIAGNOSIS — R1310 Dysphagia, unspecified: Secondary | ICD-10-CM

## 2012-08-09 ENCOUNTER — Other Ambulatory Visit: Payer: Medicare Other

## 2012-08-19 ENCOUNTER — Ambulatory Visit
Admission: RE | Admit: 2012-08-19 | Discharge: 2012-08-19 | Disposition: A | Discharge status: Home / Self Care | Payer: Medicare Other | Source: Ambulatory Visit | Attending: Internal Medicine | Admitting: Internal Medicine

## 2012-08-19 DIAGNOSIS — R1310 Dysphagia, unspecified: Secondary | ICD-10-CM

## 2012-11-20 ENCOUNTER — Other Ambulatory Visit: Payer: Self-pay

## 2012-11-20 DIAGNOSIS — Z1231 Encounter for screening mammogram for malignant neoplasm of breast: Secondary | ICD-10-CM

## 2012-12-23 ENCOUNTER — Ambulatory Visit
Admission: RE | Admit: 2012-12-23 | Discharge: 2012-12-23 | Disposition: A | Discharge status: Home / Self Care | Payer: Medicare Other | Source: Ambulatory Visit

## 2012-12-23 DIAGNOSIS — Z1231 Encounter for screening mammogram for malignant neoplasm of breast: Secondary | ICD-10-CM

## 2013-02-11 ENCOUNTER — Other Ambulatory Visit: Payer: Self-pay | Admitting: Internal Medicine

## 2013-02-11 DIAGNOSIS — R42 Dizziness and giddiness: Secondary | ICD-10-CM

## 2013-02-16 ENCOUNTER — Ambulatory Visit
Admission: RE | Admit: 2013-02-16 | Discharge: 2013-02-16 | Disposition: A | Discharge status: Home / Self Care | Payer: Medicare Other | Source: Ambulatory Visit | Attending: Internal Medicine | Admitting: Internal Medicine

## 2013-02-16 DIAGNOSIS — R42 Dizziness and giddiness: Secondary | ICD-10-CM

## 2013-03-20 ENCOUNTER — Encounter: Payer: Self-pay | Admitting: Neurology

## 2013-03-20 ENCOUNTER — Ambulatory Visit (INDEPENDENT_AMBULATORY_CARE_PROVIDER_SITE_OTHER): Payer: Medicare Other | Admitting: Neurology

## 2013-03-20 VITALS — BP 152/87 | HR 74 | Ht 66.25 in | Wt 200.0 lb

## 2013-03-20 DIAGNOSIS — R269 Unspecified abnormalities of gait and mobility: Secondary | ICD-10-CM

## 2013-03-20 HISTORY — DX: Unspecified abnormalities of gait and mobility: R26.9

## 2013-03-20 NOTE — Progress Notes (Signed)
Reason for visit:  Gait disorder  Margaret Francis is a 79 y.o. female  History of present illness:  Margaret Francis is an 79 year old left-handed white female with a history of a chronic gait disorder. The patient was seen through this office in 2012 for similar problems. At that point, the patient was noted to have ventriculomegaly, and the possibility of normal pressure hydrocephalus was entertained. The patient has had a several year history of urinary incontinence, and she has had lumbosacral spine surgery previously. The patient was noted to have symptoms of a peripheral neuropathy as well, with a stocking pattern pinprick sensory deficit, and lack of position sense in the feet. The patient did not wish to pursue any further workup at that time. The patient returns to the office at this point indicating that she had a sudden onset of gait instability around 02/11/2013. The patient had been walking relatively normally prior to this. The patient got up out of bed, and noted that she was quite staggery on her feet. The patient was unable to walk for 30-60 minutes. EMS was called, and the patient was noted to have an elevation in her blood pressure. The patient has been evaluated with MRI of the brain, and this did not show an acute stroke. 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 The ventriculomegaly continues to be present, stable from 2 years ago. The patient has had MRA of the head that shows a dominant left vertebral artery. The patient underwent a carotid Doppler study, and she claims that the study was unremarkable. The patient reported no other symptoms of numbness or weakness of the face, arms, or legs. The patient denied any slurred speech, problems swallowing, double vision, or loss of vision. The patient did not have headache or neck pain. The patient indicates that her walking has improved, but it has not returned to normal. The patient denied  any vertigo associated with a gait problem. The patient is sent to this office for further evaluation.  Past Medical History  Diagnosis Date  . Hypertension   . Thyroid disease   . Urinary frequency 04/19/2011  . Abnormality of gait 03/20/2013  . Gastroesophageal reflux disease   . Cerebral ventriculomegaly   . Osteopenia   . History of stroke     Right inferior cerebellum  . Stroke   . Urinary incontinence     Past Surgical History  Procedure Laterality Date  . Abdominal hysterectomy    . Bladder surgery  2010  . Urethral sling  2010  . Back surgery      x2  . Appendectomy    . Cholecystectomy    . Tonsillectomy      at age32  . Esophagus surgery      tumor removal.    Family History  Problem Relation Age of Onset  . Diabetes Mother   . Hypertension Mother   . Arthritis Father     died at age 32  . Cancer Sister 74    breast cancer / mastectomy    Social history:  reports that she has never smoked. She has never used smokeless tobacco. She reports that she does not drink alcohol or use illicit drugs.  Medications:  Current Outpatient Prescriptions on File Prior to Visit  Medication Sig Dispense Refill  . aspirin 81 MG tablet Take 81 mg by mouth daily.        Marland Kitchen levothyroxine (SYNTHROID, LEVOTHROID) 50 MCG  tablet Take 50 mcg by mouth daily.        . Omeprazole (PRILOSEC PO) Take by mouth.        . valsartan (DIOVAN) 320 MG tablet Take 320 mg by mouth daily.         No current facility-administered medications on file prior to visit.    Allergies:  Allergies  Allergen Reactions  . Demerol Nausea And Vomiting  . Morphine And Related Nausea And Vomiting    ROS:  Out of a complete 14 system review of symptoms, the patient complains only of the following symptoms, and all other reviewed systems are negative.  Incontinence of bladder Gait disturbance  Blood pressure 152/87, pulse 74, height 5' 6.25" (1.683 m), weight 200 lb (90.719 kg).  Physical  Exam  General: The patient is alert and cooperative at the time of the examination. The patient is minimally obese.  Head: Pupils are equal, round, and reactive to light. Discs are flat bilaterally.  Neck: The neck is supple, no carotid bruits are noted.  Respiratory: The respiratory examination is clear.  Cardiovascular: The cardiovascular examination reveals a regular rate and rhythm, no obvious murmurs or rubs are noted.  Skin: Extremities are with 1+ edema below the knees bilaterally.  Neurologic Exam  Mental status:  Cranial nerves: Facial symmetry is present. There is good sensation of the face to pinprick and soft touch bilaterally. The strength of the facial muscles and the muscles to head turning and shoulder shrug are normal bilaterally. Speech is well enunciated, no aphasia or dysarthria is noted. Extraocular movements are full. Visual fields are full.  Motor: The motor testing reveals 5 over 5 strength of all 4 extremities. Good symmetric motor tone is noted throughout.  Sensory: Sensory testing is intact to pinprick, soft touch, vibration sensation, and position sense on all 4 extremities, with the exception that position sense is impaired bilaterally in the feet. The patient does have a stocking pattern pinprick sensory deficit one half way up the legs bilaterally. No evidence of extinction is noted.  Coordination: Cerebellar testing reveals good finger-nose-finger and heel-to-shin bilaterally.  Gait and station: Gait is slightly wide-based, the patient is able to walk independently. Tandem gait is poor. Romberg is negative, but the patient is unsteady. No drift is seen.  Reflexes: Deep tendon reflexes are symmetric, but depressed bilaterally. Toes are downgoing bilaterally.   Assessment/Plan:  1. Ventriculomegaly, possible normal pressure hydrocephalus  2. Chronic gait disorder, with acute decompensation  3. Urinary incontinence  The patient very well could have  normal pressure hydrocephalus, but it is very unusual to have a sudden change in the ability to ambulate as this patient describes. It is possible that she sustained a small brain stem infarct, and this was inapparent by MRI. The patient also has a clinical examination consistent with a peripheral neuropathy with poor position sense in the feet that may also impair balance. Previously, in 2012, lumbar puncture and nerve conduction study and blood work was recommended. The patient did not wish to pursue workup at that time. The patient feels similarly at this time. The patient does not wish to have physical therapy for gait training. I have indicated to her that she is to contact our office if she has noted a change in her walking over time. Nerve conduction studies and blood work will be done at that time, and the patient will be considered for a lumbar puncture. Otherwise, the patient will followup through this office if needed.  Marlan Palau MD 03/20/2013 8:20 PM  [facility] [address] [place], [place] [address]  Phone [phone] Fax [phone]

## 2013-12-01 ENCOUNTER — Encounter: Payer: Self-pay | Admitting: *Deleted

## 2014-08-24 ENCOUNTER — Other Ambulatory Visit: Payer: Self-pay

## 2014-08-24 DIAGNOSIS — Z1231 Encounter for screening mammogram for malignant neoplasm of breast: Secondary | ICD-10-CM

## 2014-09-02 ENCOUNTER — Ambulatory Visit: Payer: Self-pay

## 2015-08-11 ENCOUNTER — Ambulatory Visit: Payer: Self-pay

## 2015-08-26 ENCOUNTER — Ambulatory Visit: Payer: Self-pay

## 2019-02-28 ENCOUNTER — Emergency Department
Admission: EM | Admit: 2019-02-28 | Discharge: 2019-02-28 | Disposition: A | Discharge status: Home / Self Care | Payer: Medicare Other | Attending: Emergency Medicine | Admitting: Emergency Medicine

## 2019-02-28 ENCOUNTER — Encounter: Payer: Self-pay | Admitting: Emergency Medicine

## 2019-02-28 ENCOUNTER — Other Ambulatory Visit: Payer: Self-pay

## 2019-02-28 DIAGNOSIS — I10 Essential (primary) hypertension: Secondary | ICD-10-CM | POA: Insufficient documentation

## 2019-02-28 DIAGNOSIS — R197 Diarrhea, unspecified: Secondary | ICD-10-CM | POA: Insufficient documentation

## 2019-02-28 DIAGNOSIS — Z79899 Other long term (current) drug therapy: Secondary | ICD-10-CM | POA: Diagnosis not present

## 2019-02-28 DIAGNOSIS — Z7982 Long term (current) use of aspirin: Secondary | ICD-10-CM | POA: Diagnosis not present

## 2019-02-28 LAB — COMPREHENSIVE METABOLIC PANEL
ALT: 41 U/L (ref 0–44)
AST: 36 U/L (ref 15–41)
Albumin: 3.4 g/dL — ABNORMAL LOW (ref 3.5–5.0)
Alkaline Phosphatase: 88 U/L (ref 38–126)
Anion gap: 10 (ref 5–15)
BUN: 19 mg/dL (ref 8–23)
CO2: 21 mmol/L — ABNORMAL LOW (ref 22–32)
Calcium: 10.3 mg/dL (ref 8.9–10.3)
Chloride: 104 mmol/L (ref 98–111)
Creatinine, Ser: 0.83 mg/dL (ref 0.44–1.00)
GFR calc Af Amer: >60 mL/min (ref >60)
GFR calc non Af Amer: >60 mL/min (ref >60)
Glucose, Bld: 117 mg/dL — ABNORMAL HIGH (ref 70–99)
Potassium: 2.8 mmol/L — ABNORMAL LOW (ref 3.5–5.1)
Sodium: 135 mmol/L (ref 135–145)
Total Bilirubin: 0.4 mg/dL (ref 0.3–1.2)
Total Protein: 6.7 g/dL (ref 6.5–8.1)

## 2019-02-28 LAB — URINALYSIS, COMPLETE (UACMP) WITH MICROSCOPIC
Bacteria, UA: NONE SEEN
Bilirubin Urine: NEGATIVE
Glucose, UA: NEGATIVE mg/dL
Hgb urine dipstick: NEGATIVE
Ketones, ur: NEGATIVE mg/dL
Nitrite: NEGATIVE
Protein, ur: 30 mg/dL — AB
Specific Gravity, Urine: 1.015 (ref 1.005–1.030)
pH: 6 (ref 5.0–8.0)

## 2019-02-28 LAB — GASTROINTESTINAL PANEL BY PCR, STOOL (REPLACES STOOL CULTURE)

## 2019-02-28 LAB — CBC
HCT: 41.1 % (ref 36.0–46.0)
Hemoglobin: 14.3 g/dL (ref 12.0–15.0)
MCH: 30.8 pg (ref 26.0–34.0)
MCHC: 34.8 g/dL (ref 30.0–36.0)
MCV: 88.4 fL (ref 80.0–100.0)
Platelets: 231 10*3/uL (ref 150–400)
RBC: 4.65 MIL/uL (ref 3.87–5.11)
RDW: 13.2 % (ref 11.5–15.5)
WBC: 6.5 10*3/uL (ref 4.0–10.5)
nRBC: 0 % (ref 0.0–0.2)

## 2019-02-28 LAB — C DIFFICILE QUICK SCREEN W PCR REFLEX
C Diff interpretation: NOT DETECTED
C Diff toxin: NEGATIVE

## 2019-02-28 LAB — LIPASE, BLOOD: Lipase: 54 U/L — ABNORMAL HIGH (ref 11–51)

## 2019-02-28 LAB — C DIFFICILE QUICK SCREEN W PCR REFLEX??: C Diff antigen: NEGATIVE

## 2019-02-28 MED ORDER — SODIUM CHLORIDE 0.9 % IV BOLUS
1000.0000 mL | Freq: Once | INTRAVENOUS | Status: AC
  Administered 2019-02-28: 12:00:00 1000 mL via INTRAVENOUS

## 2019-02-28 MED ORDER — SODIUM CHLORIDE 0.9 % IV BOLUS
500.0000 mL | Freq: Once | INTRAVENOUS | Status: AC
Start: 2019-02-28 — End: 2019-02-28
  Administered 2019-02-28: 13:00:00 500 mL via INTRAVENOUS

## 2019-02-28 MED ORDER — SODIUM CHLORIDE 0.9% FLUSH: 3.0000 mL | Freq: Once | INTRAVENOUS | Status: DC

## 2019-02-28 MED ORDER — CIPROFLOXACIN HCL 500 MG PO TABS: 500.0000 mg | ORAL_TABLET | Freq: Two times a day (BID) | ORAL | 0 refills | Status: DC

## 2019-02-28 NOTE — ED Triage Notes (Signed)
FIRST NURSE NOTE-here for diarrhea.  Alert and oriented. NAD

## 2019-02-28 NOTE — ED Notes (Signed)
Attempted to call patient. No Answer at this time

## 2019-02-28 NOTE — ED Notes (Signed)
This RN and Annie Main, RN to bedside due to patient calling out. Pt assisted to the bathroom, this RN collected UA and stool sample at this time.

## 2019-02-28 NOTE — ED Notes (Signed)
Rn called to update pt. Pt verbalized understanding and reports she still uses [facility] and is able to pick medication up this afternoon. No further questions at this time.

## 2019-02-28 NOTE — ED Notes (Signed)
Pt c/o diarrhea since Monday - pt reports 10+ stools in 24 hours - pt denies any illness/atb therapy recently Pt reports fever Monday and Tuesday with max 102 - denies exposure to anyone with covid sxs

## 2019-02-28 NOTE — ED Provider Notes (Signed)
[facility] Emergency Department Provider Note   ____________________________________________    I have reviewed the triage vital signs and the nursing notes.   HISTORY  Chief Complaint Diarrhea     HPI Margaret Francis is a 85 y.o. female who presents with complaints of diarrhea.  Patient reports over the last 2 to 3 days she has had multiple episodes of watery brown stool.  No sick contacts reported.  No recent antibiotic use.  No fevers or chills.  Is not take anything for this.  She reports her sister is a Marine scientist and recommend she come be evaluated for dehydration.  She did feel somewhat lightheaded today  Past Medical History:  Diagnosis Date  . Abnormality of gait 03/20/2013  . Cerebral ventriculomegaly   . Gastroesophageal reflux disease   . History of stroke    Right inferior cerebellum  . Hypertension   . Osteopenia   . Stroke ([place])   . Thyroid disease   . Urinary frequency 04/19/2011  . Urinary incontinence     Patient Active Problem List   Diagnosis Date Noted  . Abnormality of gait 03/20/2013  . Urinary frequency 04/19/2011    Past Surgical History:  Procedure Laterality Date  . ABDOMINAL HYSTERECTOMY    . APPENDECTOMY    . BACK SURGERY     x2  . BLADDER SURGERY  2010  . CHOLECYSTECTOMY    . ESOPHAGUS SURGERY     tumor removal.  . TONSILLECTOMY     at age32  . URETHRAL SLING  2010    Prior to Admission medications   Medication Sig Start Date End Date Taking? Authorizing Provider  aspirin 81 MG tablet Take 81 mg by mouth daily.      [provider]  levothyroxine (SYNTHROID, LEVOTHROID) 50 MCG tablet Take 50 mcg by mouth daily.      [provider]  Omeprazole (PRILOSEC PO) Take by mouth.      [provider]  solifenacin (VESICARE) 10 MG tablet Take 10 mg by mouth daily.    [provider]  valsartan (DIOVAN) 320 MG tablet Take 320 mg by mouth daily.      [provider]      Allergies Demerol and Morphine and related  Family History  Problem Relation Age of Onset  . Diabetes Mother   . Hypertension Mother   . Arthritis Father        died at age 46  . Breast cancer Sister 66       mastectomy    Social History Social History   Tobacco Use  . Smoking status: Never Smoker  . Smokeless tobacco: Never Used  . Tobacco comment: while in college  Substance Use Topics  . Alcohol use: No  . Drug use: No    Review of Systems  Constitutional: As above Eyes: No visual changes.  ENT: No sore throat. Cardiovascular: Denies chest pain. Respiratory: Denies shortness of breath. Gastrointestinal: No abdominal pain, diarrhea as above Genitourinary: No dysuria Musculoskeletal: Negative for back pain. Skin: Negative for rash. Neurological: Negative for headaches or weakness   ____________________________________________   PHYSICAL EXAM:  VITAL SIGNS: ED Triage Vitals  Enc Vitals Group     BP 02/28/19 1257 128/87     Pulse Rate 02/28/19 1143 93     Resp 02/28/19 1143 18     Temp 02/28/19 1143 98.1 F (36.7 C)     Temp Source 02/28/19 1143 Oral  SpO2 02/28/19 1143 95 %     Weight 02/28/19 1143 82.6 kg (182 lb)     Height 02/28/19 1143 1.676 m (5\' 6" )     Head Circumference --      Peak Flow --      Pain Score 02/28/19 1143 0     Pain Loc --      Pain Edu? --      Excl. in [place]? --     Constitutional: Alert and oriented. No acute distress. Pleasant and interactive  Nose: No congestion/rhinnorhea. Mouth/Throat: Mucous membranes are dry Neck:  Painless ROM Cardiovascular: Normal rate, regular rhythm. Grossly normal heart sounds.  Good peripheral circulation. Respiratory: Normal respiratory effort.  No retractions. Lungs CTAB. Gastrointestinal: Soft and nontender. No distention.  No CVA tenderness. Musculoskeletal:   Warm and well perfused Neurologic:  Normal speech and language. No gross focal neurologic deficits are appreciated.  Skin:   Skin is warm, dry and intact. No rash noted. Psychiatric: Mood and affect are normal. Speech and behavior are normal.  ____________________________________________   LABS (all labs ordered are listed, but only abnormal results are displayed)  Labs Reviewed  LIPASE, BLOOD - Abnormal; Notable for the following components:      Result Value   Lipase 54 (*)    All other components within normal limits  COMPREHENSIVE METABOLIC PANEL - Abnormal; Notable for the following components:   Potassium 2.8 (*)    CO2 21 (*)    Glucose, Bld 117 (*)    Albumin 3.4 (*)    All other components within normal limits  URINALYSIS, COMPLETE (UACMP) WITH MICROSCOPIC - Abnormal; Notable for the following components:   Color, Urine YELLOW (*)    APPearance HAZY (*)    Protein, ur 30 (*)    Leukocytes,Ua MODERATE (*)    All other components within normal limits  C DIFFICILE QUICK SCREEN W PCR REFLEX  GASTROINTESTINAL PANEL BY PCR, STOOL (REPLACES STOOL CULTURE)  CBC   ____________________________________________  EKG  ED ECG REPORT I, Lavonia Drafts, the attending physician, personally viewed and interpreted this ECG.  Date: 02/28/2019  Rhythm: normal sinus rhythm QRS Axis: normal Intervals: normal ST/T Wave abnormalities: normal Narrative Interpretation: no evidence of acute ischemia  ____________________________________________  RADIOLOGY  None ____________________________________________   PROCEDURES  Procedure(s) performed: No  Procedures   Critical Care performed: No ____________________________________________   INITIAL IMPRESSION / ASSESSMENT AND PLAN / ED COURSE  Pertinent labs & imaging results that were available during my care of the patient were reviewed by me and considered in my medical decision making (see chart for details).  Patient presents with diarrhea as described above, mildly dehydrated on exam, we will treat with IV fluids while we await labs.  Lab  work is overall reassuring, patient has no dysuria.  Treated with a total of 1.5 L and the patient reports he feels much better, no longer feels weak or lightheaded.  GI panel sent, we will call with results, C. difficile negative.  Outpatient follow-up as needed, return precautions discussed    ____________________________________________   FINAL CLINICAL IMPRESSION(S) / ED DIAGNOSES  Final diagnoses:  Diarrhea, unspecified type        Note:  This document was prepared using Dragon voice recognition software and may include unintentional dictation errors.   Lavonia Drafts, MD 02/28/19 1452

## 2019-02-28 NOTE — ED Triage Notes (Signed)
Pt reports diarrhea since Monday, fever Monday and Tuesday.

## 2019-02-28 NOTE — ED Notes (Signed)
Pt requesting to go home if the doctor is going to discharge her. Pt worried about sister in the car and her being too hot. Pt to bedside toilet at this time.

## 2019-02-28 NOTE — ED Notes (Signed)
Pt reports that she is feeling better and would like to go home.

## 2019-09-12 ENCOUNTER — Ambulatory Visit: Payer: Medicare Other | Attending: Internal Medicine

## 2019-09-12 DIAGNOSIS — Z23 Encounter for immunization: Secondary | ICD-10-CM

## 2019-09-12 NOTE — Progress Notes (Signed)
   Covid-19 Vaccination Clinic  Name:  Margaret Francis    MRN: [id number] DOB: 1930/06/14  09/12/2019  [name] was observed post Covid-19 immunization for 15 minutes without incidence. She was provided with Vaccine Information Sheet and instruction to access the V-Safe system.   [name] was instructed to call 911 with any severe reactions post vaccine: Marland Kitchen Difficulty breathing  . Swelling of your face and throat  . A fast heartbeat  . A bad rash all over your body  . Dizziness and weakness    Immunizations Administered    Name Date Dose VIS Date Route   Pfizer COVID-19 Vaccine 09/12/2019 12:24 PM 0.3 mL 08/01/2019 Intramuscular   Manufacturer: [place]   Lot: [id number]   [place]: [id number]

## 2019-10-03 ENCOUNTER — Ambulatory Visit: Payer: Medicare Other | Attending: Internal Medicine

## 2019-10-03 DIAGNOSIS — Z23 Encounter for immunization: Secondary | ICD-10-CM | POA: Insufficient documentation

## 2019-10-03 NOTE — Progress Notes (Signed)
   Covid-19 Vaccination Clinic  Name:  RANDILYN KRIGBAUM    MRN: [id number] DOB: September 03, 1929  10/03/2019  Ms. Nowak was observed post Covid-19 immunization for 15 minutes without incidence. She was provided with Vaccine Information Sheet and instruction to access the V-Safe system.   Ms. Talamantes was instructed to call 911 with any severe reactions post vaccine: Marland Kitchen Difficulty breathing  . Swelling of your face and throat  . A fast heartbeat  . A bad rash all over your body  . Dizziness and weakness    Immunizations Administered    Name Date Dose VIS Date Route   Pfizer COVID-19 Vaccine 10/03/2019  8:15 AM 0.3 mL 08/01/2019 Intramuscular   Manufacturer: [place]   Lot: [id number]   [place]: [id number]

## 2019-10-17 ENCOUNTER — Ambulatory Visit: Payer: Medicare Other

## 2020-05-05 ENCOUNTER — Other Ambulatory Visit: Payer: Self-pay | Admitting: Internal Medicine

## 2020-05-05 DIAGNOSIS — G912 (Idiopathic) normal pressure hydrocephalus: Secondary | ICD-10-CM

## 2020-06-26 ENCOUNTER — Other Ambulatory Visit: Payer: Self-pay

## 2020-06-26 ENCOUNTER — Ambulatory Visit
Admission: RE | Admit: 2020-06-26 | Discharge: 2020-06-26 | Disposition: A | Discharge status: Home / Self Care | Payer: Medicare Other | Source: Ambulatory Visit | Attending: Internal Medicine | Admitting: Internal Medicine

## 2020-06-26 DIAGNOSIS — G912 (Idiopathic) normal pressure hydrocephalus: Secondary | ICD-10-CM

## 2020-07-28 ENCOUNTER — Ambulatory Visit: Payer: Medicare Other | Admitting: Neurology

## 2020-07-28 ENCOUNTER — Other Ambulatory Visit: Payer: Self-pay

## 2020-07-28 ENCOUNTER — Encounter: Payer: Self-pay | Admitting: Neurology

## 2020-07-28 VITALS — BP 185/88 | HR 94 | Ht 66.0 in | Wt 187.0 lb

## 2020-07-28 DIAGNOSIS — M21371 Foot drop, right foot: Secondary | ICD-10-CM

## 2020-07-28 DIAGNOSIS — R269 Unspecified abnormalities of gait and mobility: Secondary | ICD-10-CM | POA: Diagnosis not present

## 2020-07-28 DIAGNOSIS — M21372 Foot drop, left foot: Secondary | ICD-10-CM | POA: Diagnosis not present

## 2020-07-28 DIAGNOSIS — E538 Deficiency of other specified B group vitamins: Secondary | ICD-10-CM

## 2020-07-28 HISTORY — DX: Foot drop, right foot: M21.371

## 2020-07-28 HISTORY — DX: Foot drop, left foot: M21.372

## 2020-07-28 NOTE — Progress Notes (Signed)
Reason for visit: Gait disorder  Referring physician: Dr. Owens Shark is a 86 y.o. female  History of present illness:  Ms. Lonardo is an 86 year old left-handed white female with a 10-year history of a gait instability problem.  The patient has been seen here in 2012, and in 2014.  On those evaluations, she was noted to have ventriculomegaly consistent with normal pressure hydrocephalus.  She also had a clinical examination consistent with a peripheral neuropathy with significant impairment of vibration and position sense in both feet.  The patient had refused any further work-up at that time.  The patient comes into the office today with gradual worsening of her balance problems once again.  Within the last 4 months, she has converted from using a cane to a walker.  She may fall on occasion.  She seems to walk better in the morning but as the day goes on she tends to fatigue and take shorter steps.  She denies any numbness or tingling in the feet or hands, she has had prior lumbosacral spine surgery in the past but denies any neck or low back pain or pain down the legs.  She does have some problems with urinary frequency and incontinence.  She has not reported any memory issues that have come on.  The patient has had another MRI of the brain done recently that shows some progression of the ventriculomegaly as compared to the prior study in 2014.  She is sent back to this office for further evaluation.  Past Medical History:  Diagnosis Date  . Abnormality of gait 03/20/2013  . Cerebral ventriculomegaly   . Gastroesophageal reflux disease   . History of stroke    Right inferior cerebellum  . Hypertension   . Osteopenia   . Stroke ([place])   . Thyroid disease   . Urinary frequency 04/19/2011  . Urinary incontinence     Past Surgical History:  Procedure Laterality Date  . ABDOMINAL HYSTERECTOMY    . APPENDECTOMY    . BACK SURGERY     x2  . BLADDER SURGERY  2010  .  CHOLECYSTECTOMY    . ESOPHAGUS SURGERY     tumor removal.  . TONSILLECTOMY     at age32  . URETHRAL SLING  2010    Family History  Problem Relation Age of Onset  . Diabetes Mother   . Hypertension Mother   . Arthritis Father        died at age 35  . Breast cancer Sister 60       mastectomy    Social history:  reports that she has never smoked. She has never used smokeless tobacco. She reports that she does not drink alcohol and does not use drugs.  Medications:  Prior to Admission medications   Medication Sig Start Date End Date Taking? Authorizing Provider  aspirin 81 MG tablet Take 81 mg by mouth daily.     Yes [provider]  levothyroxine (SYNTHROID, LEVOTHROID) 50 MCG tablet Take 50 mcg by mouth daily.     Yes [provider]  losartan (COZAAR) 50 MG tablet Take 50 mg by mouth daily.   Yes [provider]  Omeprazole (PRILOSEC PO) Take by mouth.     Yes [provider]  ciprofloxacin (CIPRO) 500 MG tablet Take 1 tablet (500 mg total) by mouth 2 (two) times daily. 02/28/19   Lavonia Drafts, MD  solifenacin (VESICARE) 10 MG tablet Take 10 mg by mouth daily.  [provider]  valsartan (DIOVAN) 320 MG tablet Take 320 mg by mouth daily.      [provider]      Allergies  Allergen Reactions  . Demerol Nausea And Vomiting  . Morphine And Related Nausea And Vomiting    ROS:  Out of a complete 14 system review of symptoms, the patient complains only of the following symptoms, and all other reviewed systems are negative.  Walking difficulty Urinary frequency and incontinence  Blood pressure (!) 185/88, pulse 94, height 5\' 6"  (1.676 m), weight 187 lb (84.8 kg).  Physical Exam  General: The patient is alert and cooperative at the time of the examination.  Eyes: Pupils are equal, round, and reactive to light. Discs are flat bilaterally.  Neck: The neck is supple, no carotid bruits are noted.  Respiratory: The  respiratory examination is clear.  Cardiovascular: The cardiovascular examination reveals a regular rate and rhythm, no obvious murmurs or rubs are noted.  Skin: Extremities are without significant edema.  Neurologic Exam  Mental status: The patient is alert and oriented x 3 at the time of the examination. The patient has apparent normal recent and remote memory, with an apparently normal attention span and concentration ability.  Cranial nerves: Facial symmetry is present. There is good sensation of the face to pinprick and soft touch bilaterally. The strength of the facial muscles and the muscles to head turning and shoulder shrug are normal bilaterally. Speech is well enunciated, no aphasia or dysarthria is noted. Extraocular movements are full. Visual fields are full. The tongue is midline, and the patient has symmetric elevation of the soft palate. No obvious hearing deficits are noted.  Motor: The motor testing reveals 5 over 5 strength of all 4 extremities, with exception of bilateral foot drops and some weakness with hip flexion bilaterally, left greater than right. Good symmetric motor tone is noted throughout.  Sensory: Sensory testing is intact to pinprick, soft touch, vibration sensation, and position sense on the upper extremities, with exception that position sense is decreased on the right hand.  With the lower extremities there is a stocking pattern pinprick sensory deficit two thirds the way up the lower extremities below the knees, severe impairment of vibration and position sense is seen in both feet. No evidence of extinction is noted.  Coordination: Cerebellar testing reveals good finger-nose-finger and heel-to-shin bilaterally.  Gait and station: Gait is wide-based and unsteady.  With the walker, the gait is still wide-based but less so, and more stable.  Tandem gait was not attempted.  Romberg is negative but is unsteady.  Reflexes: Deep tendon reflexes are symmetric, but  are depressed bilaterally. Toes are downgoing bilaterally.   MRI brain 06/26/20:  IMPRESSION: 1. Progressive dilation of the lateral and third ventricles concerning for hydrocephalus. 2. Progressive periventricular T2 signal may be related to ischemia or the hydrocephalus. 3. No acute intracranial abnormality. 4. Remote lacunar infarcts of the cerebellum are stable.  * MRI scan images were reviewed online. I agree with the written report.    Assessment/Plan:  1.  Probable peripheral neuropathy  2.  Ventriculomegaly, possible normal pressure hydrocephalus  3.  Bilateral foot drop  4.  Gait disorder  The patient has a significant gait disorder that is likely mainly related to a peripheral nerve issue.  The MRI of the brain is fully consistent with normal pressure hydrocephalus, this could be an additive factor but I do not believe that this is the primary issue.  The  patient has developed bilateral foot drop since last evaluation.  The patient will be set up for further blood work, she will have nerve conductions on both legs and EMG on 1 leg.  She will otherwise follow-up in 4 months.  The patient once again states that she does not wish to consider a VP shunt placement.  Jill Alexanders MD 07/28/2020 9:23 AM  [facility] [address] [place], [place] [address]  Phone [phone] Fax [phone]

## 2020-07-31 LAB — MULTIPLE MYELOMA PANEL, SERUM
Albumin SerPl Elph-Mcnc: 3.7 g/dL (ref 2.9–4.4)
Albumin/Glob SerPl: 1.2 (ref 0.7–1.7)
Alpha 1: 0.2 g/dL (ref 0.0–0.4)
Alpha2 Glob SerPl Elph-Mcnc: 1 g/dL (ref 0.4–1.0)
B-Globulin SerPl Elph-Mcnc: 1.1 g/dL (ref 0.7–1.3)
Gamma Glob SerPl Elph-Mcnc: 0.8 g/dL (ref 0.4–1.8)
Globulin, Total: 3.1 g/dL (ref 2.2–3.9)
IgA/Immunoglobulin A, Serum: 172 mg/dL (ref 64–422)
IgG (Immunoglobin G), Serum: 787 mg/dL (ref 586–1602)
IgM (Immunoglobulin M), Srm: 100 mg/dL (ref 26–217)
Total Protein: 6.8 g/dL (ref 6.0–8.5)

## 2020-07-31 LAB — RPR: RPR Ser Ql: NONREACTIVE

## 2020-07-31 LAB — COPPER, SERUM: Copper: 102 ug/dL (ref 80–158)

## 2020-07-31 LAB — ANGIOTENSIN CONVERTING ENZYME: Angio Convert Enzyme: 34 U/L (ref 14–82)

## 2020-07-31 LAB — VITAMIN B12: Vitamin B-12: 437 pg/mL (ref 232–1245)

## 2020-07-31 LAB — SEDIMENTATION RATE: Sed Rate: 2 mm/hr (ref 0–40)

## 2020-07-31 LAB — ANA W/REFLEX: Anti Nuclear Antibody (ANA): NEGATIVE

## 2020-07-31 LAB — B. BURGDORFI ANTIBODIES: Lyme IgG/IgM Ab: <0.91 {ISR} (ref 0.00–0.90)

## 2020-08-23 ENCOUNTER — Encounter: Payer: Medicare Other | Admitting: Neurology

## 2020-09-08 ENCOUNTER — Ambulatory Visit: Payer: Medicare Other | Admitting: Neurology

## 2020-09-08 ENCOUNTER — Encounter: Payer: Self-pay | Admitting: Neurology

## 2020-09-08 ENCOUNTER — Ambulatory Visit (INDEPENDENT_AMBULATORY_CARE_PROVIDER_SITE_OTHER): Payer: Medicare Other | Admitting: Neurology

## 2020-09-08 DIAGNOSIS — M21372 Foot drop, left foot: Secondary | ICD-10-CM

## 2020-09-08 DIAGNOSIS — R269 Unspecified abnormalities of gait and mobility: Secondary | ICD-10-CM

## 2020-09-08 DIAGNOSIS — M21371 Foot drop, right foot: Secondary | ICD-10-CM

## 2020-09-08 NOTE — Progress Notes (Signed)
Please refer to EMG and nerve conduction procedure note.  

## 2020-09-08 NOTE — Procedures (Signed)
     HISTORY:  Margaret Francis is an 87 year old patient with a history of ventriculomegaly and possible normal pressure hydrocephalus.  The patient has had a chronic gait disorder with multiple falls.  She has bilateral foot drops and numbness in the feet and is being evaluated for a possible peripheral neuropathy.  NERVE CONDUCTION STUDIES:  Nerve conduction studies were performed on both lower extremities.  The distal motor latencies for the peroneal and posterior tibial nerves were within normal limits bilaterally with low motor amplitudes seen for these nerves bilaterally.  The right peroneal and right posterior tibial nerves reveals borderline normal nerve conduction velocities with slowing seen for the left peroneal and left posterior tibial nerves.  The sensory latencies for the sural nerves were borderline normal on the left and normal on the right and absent for the right peroneal sensory latency, borderline normal for the left.  The F-wave latencies for the posterior tibial nerves were prolonged bilaterally.  EMG STUDIES:  EMG study was performed on the right lower extremity:  The tibialis anterior muscle reveals 2 to 5K motor units with decreased recruitment. No fibrillations or positive waves were seen. The peroneus tertius muscle reveals 2 to 4K motor units with decreased recruitment. 1+ positive waves were seen. The medial gastrocnemius muscle reveals 1 to 4K motor units with decreased recruitment. No fibrillations or positive waves were seen. The vastus lateralis muscle reveals 2 to 4K motor units with full recruitment. No fibrillations or positive waves were seen. The iliopsoas muscle reveals 2 to 4K motor units with full recruitment. No fibrillations or positive waves were seen. The biceps femoris muscle (long head) reveals 2 to 4K motor units with full recruitment. No fibrillations or positive waves were seen. The lumbosacral paraspinal muscles were tested at 3 levels, and  revealed no abnormalities of insertional activity at all 3 levels tested. There was good relaxation.   IMPRESSION:  Nerve conduction studies done on both lower extremities shows evidence of a primarily motor neuropathy of moderate severity.  EMG evaluation of the right lower extremity shows distal chronic and acute neuropathic denervation consistent with a diagnosis of peripheral neuropathy.  No clear evidence of an overlying lumbosacral radiculopathy was seen.  Jill Alexanders MD 09/08/2020 1:37 PM  [facility] [address] [place], [place] [address]  Phone [phone] Fax [phone]

## 2020-09-08 NOTE — Progress Notes (Addendum)
Patient comes in for EMG nerve conduction study evaluation.  This does show a primarily motor neuropathy of moderate severity, may be the source of the bilateral foot drops, no clear evidence of an overlying lumbosacral radiculopathy is seen.  I will get the patient set up for home health physical therapy to help improve gait safety.    [place]    Nerve / Sites Muscle Latency Ref. Amplitude Ref. Rel Amp Segments Distance Velocity Ref. Area    ms ms mV mV %  cm m/s m/s mVms  R Peroneal - EDB     Ankle EDB 5.0 ?6.5 1.7 ?2.0 100 Ankle - EDB 9   6.7     Fib head EDB 11.1  1.5  86.6 Fib head - Ankle 27 44 ?44 5.6     Pop fossa EDB 13.4  1.4  93.3 Pop fossa - Fib head 10 44 ?44 6.3         Pop fossa - Ankle      L Peroneal - EDB     Ankle EDB 6.4 ?6.5 0.3 ?2.0 100 Ankle - EDB 9   1.0     Fib head EDB 13.4  0.3  108 Fib head - Ankle 27 39 ?44 1.0     Pop fossa EDB 16.0  0.3  101 Pop fossa - Fib head 10 39 ?44 1.0         Pop fossa - Ankle      R Tibial - AH     Ankle AH 3.2 ?5.8 3.9 ?4.0 100 Ankle - AH 9   12.6     Pop fossa AH 12.4  3.2  83.1 Pop fossa - Ankle 38 41 ?41 11.6  L Tibial - AH     Ankle AH 4.6 ?5.8 3.6 ?4.0 100 Ankle - AH 9   16.7     Pop fossa AH 14.2  3.2  88 Pop fossa - Ankle 36 37 ?41 16.8             SNC    Nerve / Sites Rec. Site Peak Lat Ref.  Amp Ref. Segments Distance    ms ms V V  cm  R Sural - Ankle (Calf)     Calf Ankle 4.3 ?4.4 3 ?6 Calf - Ankle 14  L Sural - Ankle (Calf)     Calf Ankle 4.4 ?4.4 4 ?6 Calf - Ankle 14  R Superficial peroneal - Ankle     Lat leg Ankle NR ?4.4 NR ?6 Lat leg - Ankle 14  L Superficial peroneal - Ankle     Lat leg Ankle 4.4 ?4.4 2 ?6 Lat leg - Ankle 14             F  Wave    Nerve F Lat Ref.   ms ms  R Tibial - AH 60.7 ?56.0  L Tibial - AH 60.3 ?56.0

## 2020-09-09 ENCOUNTER — Telehealth: Payer: Self-pay | Admitting: Neurology

## 2020-09-09 DIAGNOSIS — R269 Unspecified abnormalities of gait and mobility: Secondary | ICD-10-CM

## 2020-09-09 NOTE — Telephone Encounter (Signed)
Called her back and advised the referral is for PT for gait instability/abnormality.  She was appreciative for the call to clarify the order

## 2020-09-09 NOTE — Telephone Encounter (Signed)
[place] from [facility] called needing to speak to the RN to confirm some information on the referral that they received. [place] would like to know if wound care was supposed to be on the referral, If so they are needing notes and more information on the pt's wound. Please advise.

## 2020-09-13 DIAGNOSIS — M21372 Foot drop, left foot: Secondary | ICD-10-CM | POA: Diagnosis not present

## 2020-09-13 DIAGNOSIS — G6182 Multifocal motor neuropathy: Secondary | ICD-10-CM | POA: Diagnosis not present

## 2020-09-13 DIAGNOSIS — Z9181 History of falling: Secondary | ICD-10-CM | POA: Diagnosis not present

## 2020-09-13 DIAGNOSIS — M21371 Foot drop, right foot: Secondary | ICD-10-CM | POA: Diagnosis not present

## 2020-09-13 DIAGNOSIS — R2689 Other abnormalities of gait and mobility: Secondary | ICD-10-CM | POA: Diagnosis not present

## 2020-09-13 DIAGNOSIS — E039 Hypothyroidism, unspecified: Secondary | ICD-10-CM | POA: Diagnosis not present

## 2020-09-13 DIAGNOSIS — I10 Essential (primary) hypertension: Secondary | ICD-10-CM | POA: Diagnosis not present

## 2020-09-13 DIAGNOSIS — Z8673 Personal history of transient ischemic attack (TIA), and cerebral infarction without residual deficits: Secondary | ICD-10-CM | POA: Diagnosis not present

## 2020-09-13 DIAGNOSIS — G912 (Idiopathic) normal pressure hydrocephalus: Secondary | ICD-10-CM | POA: Diagnosis not present

## 2020-09-13 NOTE — Telephone Encounter (Signed)
Called the number provided and left a VM providing the verbal order requested. Advised I would forward the fax with the order she is needing as well.

## 2020-09-13 NOTE — Addendum Note (Signed)
Addended by: Darleen Crocker on: 09/13/2020 04:36 PM   Modules accepted: Orders

## 2020-09-13 NOTE — Telephone Encounter (Signed)
[facility] Mickel Baas) called, Home Health evaluation request; PT frequency: twice a wk for 4 wks, once a wk for 4 wks.  Would like to get a evaluation for orthotics. Evaluation for orthotics need to be a written script. Can fax orthotic evaluation to [phone]. You can leave a verbal order message on my voicemail for PT. Contact info: [phone]

## 2020-09-14 DIAGNOSIS — E785 Hyperlipidemia, unspecified: Secondary | ICD-10-CM | POA: Diagnosis not present

## 2020-09-14 DIAGNOSIS — I10 Essential (primary) hypertension: Secondary | ICD-10-CM | POA: Diagnosis not present

## 2020-09-14 DIAGNOSIS — E119 Type 2 diabetes mellitus without complications: Secondary | ICD-10-CM | POA: Diagnosis not present

## 2020-09-14 DIAGNOSIS — E7800 Pure hypercholesterolemia, unspecified: Secondary | ICD-10-CM | POA: Diagnosis not present

## 2020-09-14 DIAGNOSIS — K219 Gastro-esophageal reflux disease without esophagitis: Secondary | ICD-10-CM | POA: Diagnosis not present

## 2020-09-14 DIAGNOSIS — E039 Hypothyroidism, unspecified: Secondary | ICD-10-CM | POA: Diagnosis not present

## 2020-09-17 DIAGNOSIS — G912 (Idiopathic) normal pressure hydrocephalus: Secondary | ICD-10-CM | POA: Diagnosis not present

## 2020-09-17 DIAGNOSIS — E039 Hypothyroidism, unspecified: Secondary | ICD-10-CM | POA: Diagnosis not present

## 2020-09-17 DIAGNOSIS — R2689 Other abnormalities of gait and mobility: Secondary | ICD-10-CM | POA: Diagnosis not present

## 2020-09-17 DIAGNOSIS — G6182 Multifocal motor neuropathy: Secondary | ICD-10-CM | POA: Diagnosis not present

## 2020-09-17 DIAGNOSIS — Z8673 Personal history of transient ischemic attack (TIA), and cerebral infarction without residual deficits: Secondary | ICD-10-CM | POA: Diagnosis not present

## 2020-09-17 DIAGNOSIS — I10 Essential (primary) hypertension: Secondary | ICD-10-CM | POA: Diagnosis not present

## 2020-09-17 DIAGNOSIS — Z9181 History of falling: Secondary | ICD-10-CM | POA: Diagnosis not present

## 2020-09-17 DIAGNOSIS — M21371 Foot drop, right foot: Secondary | ICD-10-CM | POA: Diagnosis not present

## 2020-09-17 DIAGNOSIS — M21372 Foot drop, left foot: Secondary | ICD-10-CM | POA: Diagnosis not present

## 2020-09-20 DIAGNOSIS — I10 Essential (primary) hypertension: Secondary | ICD-10-CM | POA: Diagnosis not present

## 2020-09-20 DIAGNOSIS — M21372 Foot drop, left foot: Secondary | ICD-10-CM | POA: Diagnosis not present

## 2020-09-20 DIAGNOSIS — Z8673 Personal history of transient ischemic attack (TIA), and cerebral infarction without residual deficits: Secondary | ICD-10-CM | POA: Diagnosis not present

## 2020-09-20 DIAGNOSIS — E039 Hypothyroidism, unspecified: Secondary | ICD-10-CM | POA: Diagnosis not present

## 2020-09-20 DIAGNOSIS — Z9181 History of falling: Secondary | ICD-10-CM | POA: Diagnosis not present

## 2020-09-20 DIAGNOSIS — G6182 Multifocal motor neuropathy: Secondary | ICD-10-CM | POA: Diagnosis not present

## 2020-09-20 DIAGNOSIS — R2689 Other abnormalities of gait and mobility: Secondary | ICD-10-CM | POA: Diagnosis not present

## 2020-09-20 DIAGNOSIS — G912 (Idiopathic) normal pressure hydrocephalus: Secondary | ICD-10-CM | POA: Diagnosis not present

## 2020-09-20 DIAGNOSIS — M21371 Foot drop, right foot: Secondary | ICD-10-CM | POA: Diagnosis not present

## 2020-09-22 DIAGNOSIS — M21371 Foot drop, right foot: Secondary | ICD-10-CM | POA: Diagnosis not present

## 2020-09-22 DIAGNOSIS — Z8673 Personal history of transient ischemic attack (TIA), and cerebral infarction without residual deficits: Secondary | ICD-10-CM | POA: Diagnosis not present

## 2020-09-22 DIAGNOSIS — E039 Hypothyroidism, unspecified: Secondary | ICD-10-CM | POA: Diagnosis not present

## 2020-09-22 DIAGNOSIS — I10 Essential (primary) hypertension: Secondary | ICD-10-CM | POA: Diagnosis not present

## 2020-09-22 DIAGNOSIS — G912 (Idiopathic) normal pressure hydrocephalus: Secondary | ICD-10-CM | POA: Diagnosis not present

## 2020-09-22 DIAGNOSIS — Z9181 History of falling: Secondary | ICD-10-CM | POA: Diagnosis not present

## 2020-09-22 DIAGNOSIS — G6182 Multifocal motor neuropathy: Secondary | ICD-10-CM | POA: Diagnosis not present

## 2020-09-22 DIAGNOSIS — R2689 Other abnormalities of gait and mobility: Secondary | ICD-10-CM | POA: Diagnosis not present

## 2020-09-22 DIAGNOSIS — M21372 Foot drop, left foot: Secondary | ICD-10-CM | POA: Diagnosis not present

## 2020-09-27 DIAGNOSIS — M21372 Foot drop, left foot: Secondary | ICD-10-CM | POA: Diagnosis not present

## 2020-09-27 DIAGNOSIS — G912 (Idiopathic) normal pressure hydrocephalus: Secondary | ICD-10-CM | POA: Diagnosis not present

## 2020-09-27 DIAGNOSIS — Z9181 History of falling: Secondary | ICD-10-CM | POA: Diagnosis not present

## 2020-09-27 DIAGNOSIS — G6182 Multifocal motor neuropathy: Secondary | ICD-10-CM | POA: Diagnosis not present

## 2020-09-27 DIAGNOSIS — M21371 Foot drop, right foot: Secondary | ICD-10-CM | POA: Diagnosis not present

## 2020-09-27 DIAGNOSIS — I10 Essential (primary) hypertension: Secondary | ICD-10-CM | POA: Diagnosis not present

## 2020-09-27 DIAGNOSIS — E039 Hypothyroidism, unspecified: Secondary | ICD-10-CM | POA: Diagnosis not present

## 2020-09-27 DIAGNOSIS — Z8673 Personal history of transient ischemic attack (TIA), and cerebral infarction without residual deficits: Secondary | ICD-10-CM | POA: Diagnosis not present

## 2020-09-27 DIAGNOSIS — R2689 Other abnormalities of gait and mobility: Secondary | ICD-10-CM | POA: Diagnosis not present

## 2020-09-28 DIAGNOSIS — Z8673 Personal history of transient ischemic attack (TIA), and cerebral infarction without residual deficits: Secondary | ICD-10-CM | POA: Diagnosis not present

## 2020-09-28 DIAGNOSIS — E039 Hypothyroidism, unspecified: Secondary | ICD-10-CM | POA: Diagnosis not present

## 2020-09-28 DIAGNOSIS — G6182 Multifocal motor neuropathy: Secondary | ICD-10-CM | POA: Diagnosis not present

## 2020-09-28 DIAGNOSIS — Z9181 History of falling: Secondary | ICD-10-CM | POA: Diagnosis not present

## 2020-09-28 DIAGNOSIS — M21371 Foot drop, right foot: Secondary | ICD-10-CM | POA: Diagnosis not present

## 2020-09-28 DIAGNOSIS — M21372 Foot drop, left foot: Secondary | ICD-10-CM | POA: Diagnosis not present

## 2020-09-28 DIAGNOSIS — I10 Essential (primary) hypertension: Secondary | ICD-10-CM | POA: Diagnosis not present

## 2020-09-28 DIAGNOSIS — G912 (Idiopathic) normal pressure hydrocephalus: Secondary | ICD-10-CM | POA: Diagnosis not present

## 2020-09-28 DIAGNOSIS — R2689 Other abnormalities of gait and mobility: Secondary | ICD-10-CM | POA: Diagnosis not present

## 2020-09-29 DIAGNOSIS — E039 Hypothyroidism, unspecified: Secondary | ICD-10-CM | POA: Diagnosis not present

## 2020-09-29 DIAGNOSIS — M21371 Foot drop, right foot: Secondary | ICD-10-CM | POA: Diagnosis not present

## 2020-09-29 DIAGNOSIS — I10 Essential (primary) hypertension: Secondary | ICD-10-CM | POA: Diagnosis not present

## 2020-09-29 DIAGNOSIS — Z8673 Personal history of transient ischemic attack (TIA), and cerebral infarction without residual deficits: Secondary | ICD-10-CM | POA: Diagnosis not present

## 2020-09-29 DIAGNOSIS — G6182 Multifocal motor neuropathy: Secondary | ICD-10-CM | POA: Diagnosis not present

## 2020-09-29 DIAGNOSIS — M21372 Foot drop, left foot: Secondary | ICD-10-CM | POA: Diagnosis not present

## 2020-09-29 DIAGNOSIS — G912 (Idiopathic) normal pressure hydrocephalus: Secondary | ICD-10-CM | POA: Diagnosis not present

## 2020-09-29 DIAGNOSIS — Z9181 History of falling: Secondary | ICD-10-CM | POA: Diagnosis not present

## 2020-09-29 DIAGNOSIS — R2689 Other abnormalities of gait and mobility: Secondary | ICD-10-CM | POA: Diagnosis not present

## 2020-10-04 DIAGNOSIS — G629 Polyneuropathy, unspecified: Secondary | ICD-10-CM | POA: Diagnosis not present

## 2020-10-04 DIAGNOSIS — E039 Hypothyroidism, unspecified: Secondary | ICD-10-CM | POA: Diagnosis not present

## 2020-10-04 DIAGNOSIS — I10 Essential (primary) hypertension: Secondary | ICD-10-CM | POA: Diagnosis not present

## 2020-10-04 DIAGNOSIS — Z8673 Personal history of transient ischemic attack (TIA), and cerebral infarction without residual deficits: Secondary | ICD-10-CM | POA: Diagnosis not present

## 2020-10-04 DIAGNOSIS — E1169 Type 2 diabetes mellitus with other specified complication: Secondary | ICD-10-CM | POA: Diagnosis not present

## 2020-10-04 DIAGNOSIS — R2689 Other abnormalities of gait and mobility: Secondary | ICD-10-CM | POA: Diagnosis not present

## 2020-10-04 DIAGNOSIS — Z9181 History of falling: Secondary | ICD-10-CM | POA: Diagnosis not present

## 2020-10-04 DIAGNOSIS — M21372 Foot drop, left foot: Secondary | ICD-10-CM | POA: Diagnosis not present

## 2020-10-04 DIAGNOSIS — E7800 Pure hypercholesterolemia, unspecified: Secondary | ICD-10-CM | POA: Diagnosis not present

## 2020-10-04 DIAGNOSIS — Z79899 Other long term (current) drug therapy: Secondary | ICD-10-CM | POA: Diagnosis not present

## 2020-10-04 DIAGNOSIS — M21371 Foot drop, right foot: Secondary | ICD-10-CM | POA: Diagnosis not present

## 2020-10-04 DIAGNOSIS — G6182 Multifocal motor neuropathy: Secondary | ICD-10-CM | POA: Diagnosis not present

## 2020-10-04 DIAGNOSIS — G912 (Idiopathic) normal pressure hydrocephalus: Secondary | ICD-10-CM | POA: Diagnosis not present

## 2020-10-06 DIAGNOSIS — G912 (Idiopathic) normal pressure hydrocephalus: Secondary | ICD-10-CM | POA: Diagnosis not present

## 2020-10-06 DIAGNOSIS — Z9181 History of falling: Secondary | ICD-10-CM | POA: Diagnosis not present

## 2020-10-06 DIAGNOSIS — G6182 Multifocal motor neuropathy: Secondary | ICD-10-CM | POA: Diagnosis not present

## 2020-10-06 DIAGNOSIS — Z8673 Personal history of transient ischemic attack (TIA), and cerebral infarction without residual deficits: Secondary | ICD-10-CM | POA: Diagnosis not present

## 2020-10-06 DIAGNOSIS — M21371 Foot drop, right foot: Secondary | ICD-10-CM | POA: Diagnosis not present

## 2020-10-06 DIAGNOSIS — R2689 Other abnormalities of gait and mobility: Secondary | ICD-10-CM | POA: Diagnosis not present

## 2020-10-06 DIAGNOSIS — E039 Hypothyroidism, unspecified: Secondary | ICD-10-CM | POA: Diagnosis not present

## 2020-10-06 DIAGNOSIS — M21372 Foot drop, left foot: Secondary | ICD-10-CM | POA: Diagnosis not present

## 2020-10-06 DIAGNOSIS — I10 Essential (primary) hypertension: Secondary | ICD-10-CM | POA: Diagnosis not present

## 2020-10-07 DIAGNOSIS — G6182 Multifocal motor neuropathy: Secondary | ICD-10-CM | POA: Diagnosis not present

## 2020-10-07 DIAGNOSIS — M21372 Foot drop, left foot: Secondary | ICD-10-CM | POA: Diagnosis not present

## 2020-10-07 DIAGNOSIS — Z9181 History of falling: Secondary | ICD-10-CM | POA: Diagnosis not present

## 2020-10-07 DIAGNOSIS — G912 (Idiopathic) normal pressure hydrocephalus: Secondary | ICD-10-CM | POA: Diagnosis not present

## 2020-10-07 DIAGNOSIS — Z8673 Personal history of transient ischemic attack (TIA), and cerebral infarction without residual deficits: Secondary | ICD-10-CM | POA: Diagnosis not present

## 2020-10-07 DIAGNOSIS — E039 Hypothyroidism, unspecified: Secondary | ICD-10-CM | POA: Diagnosis not present

## 2020-10-07 DIAGNOSIS — M21371 Foot drop, right foot: Secondary | ICD-10-CM | POA: Diagnosis not present

## 2020-10-07 DIAGNOSIS — R2689 Other abnormalities of gait and mobility: Secondary | ICD-10-CM | POA: Diagnosis not present

## 2020-10-07 DIAGNOSIS — I10 Essential (primary) hypertension: Secondary | ICD-10-CM | POA: Diagnosis not present

## 2020-10-11 DIAGNOSIS — I10 Essential (primary) hypertension: Secondary | ICD-10-CM | POA: Diagnosis not present

## 2020-10-11 DIAGNOSIS — Z8673 Personal history of transient ischemic attack (TIA), and cerebral infarction without residual deficits: Secondary | ICD-10-CM | POA: Diagnosis not present

## 2020-10-11 DIAGNOSIS — M21371 Foot drop, right foot: Secondary | ICD-10-CM | POA: Diagnosis not present

## 2020-10-11 DIAGNOSIS — Z9181 History of falling: Secondary | ICD-10-CM | POA: Diagnosis not present

## 2020-10-11 DIAGNOSIS — E039 Hypothyroidism, unspecified: Secondary | ICD-10-CM | POA: Diagnosis not present

## 2020-10-11 DIAGNOSIS — G6182 Multifocal motor neuropathy: Secondary | ICD-10-CM | POA: Diagnosis not present

## 2020-10-11 DIAGNOSIS — G912 (Idiopathic) normal pressure hydrocephalus: Secondary | ICD-10-CM | POA: Diagnosis not present

## 2020-10-11 DIAGNOSIS — R2689 Other abnormalities of gait and mobility: Secondary | ICD-10-CM | POA: Diagnosis not present

## 2020-10-11 DIAGNOSIS — M21372 Foot drop, left foot: Secondary | ICD-10-CM | POA: Diagnosis not present

## 2020-10-18 DIAGNOSIS — Z9181 History of falling: Secondary | ICD-10-CM | POA: Diagnosis not present

## 2020-10-18 DIAGNOSIS — M21371 Foot drop, right foot: Secondary | ICD-10-CM | POA: Diagnosis not present

## 2020-10-18 DIAGNOSIS — I10 Essential (primary) hypertension: Secondary | ICD-10-CM | POA: Diagnosis not present

## 2020-10-18 DIAGNOSIS — R2689 Other abnormalities of gait and mobility: Secondary | ICD-10-CM | POA: Diagnosis not present

## 2020-10-18 DIAGNOSIS — E039 Hypothyroidism, unspecified: Secondary | ICD-10-CM | POA: Diagnosis not present

## 2020-10-18 DIAGNOSIS — G912 (Idiopathic) normal pressure hydrocephalus: Secondary | ICD-10-CM | POA: Diagnosis not present

## 2020-10-18 DIAGNOSIS — G6182 Multifocal motor neuropathy: Secondary | ICD-10-CM | POA: Diagnosis not present

## 2020-10-18 DIAGNOSIS — M21372 Foot drop, left foot: Secondary | ICD-10-CM | POA: Diagnosis not present

## 2020-10-18 DIAGNOSIS — Z8673 Personal history of transient ischemic attack (TIA), and cerebral infarction without residual deficits: Secondary | ICD-10-CM | POA: Diagnosis not present

## 2020-10-25 ENCOUNTER — Telehealth: Payer: Self-pay | Admitting: Neurology

## 2020-10-25 DIAGNOSIS — Z8673 Personal history of transient ischemic attack (TIA), and cerebral infarction without residual deficits: Secondary | ICD-10-CM | POA: Diagnosis not present

## 2020-10-25 DIAGNOSIS — E039 Hypothyroidism, unspecified: Secondary | ICD-10-CM | POA: Diagnosis not present

## 2020-10-25 DIAGNOSIS — M21371 Foot drop, right foot: Secondary | ICD-10-CM | POA: Diagnosis not present

## 2020-10-25 DIAGNOSIS — R2689 Other abnormalities of gait and mobility: Secondary | ICD-10-CM | POA: Diagnosis not present

## 2020-10-25 DIAGNOSIS — G6182 Multifocal motor neuropathy: Secondary | ICD-10-CM | POA: Diagnosis not present

## 2020-10-25 DIAGNOSIS — Z9181 History of falling: Secondary | ICD-10-CM | POA: Diagnosis not present

## 2020-10-25 DIAGNOSIS — G912 (Idiopathic) normal pressure hydrocephalus: Secondary | ICD-10-CM | POA: Diagnosis not present

## 2020-10-25 DIAGNOSIS — I10 Essential (primary) hypertension: Secondary | ICD-10-CM | POA: Diagnosis not present

## 2020-10-25 DIAGNOSIS — M21372 Foot drop, left foot: Secondary | ICD-10-CM | POA: Diagnosis not present

## 2020-10-25 NOTE — Telephone Encounter (Signed)
Margaret Francis from [facility] wanted to inform us that pt. has had 2 falls within the last week.  Best contact: [phone]

## 2020-10-26 NOTE — Telephone Encounter (Signed)
Events noted, the patient is still in physical therapy.  She has a peripheral neuropathy, she may need some assistive device to ensure safety when walking.

## 2020-10-26 NOTE — Telephone Encounter (Signed)
Returned call to [place] from [employer] [facility].  Patient has not been evaluated by PCP.  Stated first fall, patient was startled by a door opening at a restaurant and she went down, bruised both her elbows, family put ice on them and the 2nd fall, patient just slid out of chair in kitchen.  No injuries

## 2020-10-28 DIAGNOSIS — M21372 Foot drop, left foot: Secondary | ICD-10-CM | POA: Diagnosis not present

## 2020-10-28 DIAGNOSIS — E039 Hypothyroidism, unspecified: Secondary | ICD-10-CM | POA: Diagnosis not present

## 2020-10-28 DIAGNOSIS — I10 Essential (primary) hypertension: Secondary | ICD-10-CM | POA: Diagnosis not present

## 2020-10-28 DIAGNOSIS — G912 (Idiopathic) normal pressure hydrocephalus: Secondary | ICD-10-CM | POA: Diagnosis not present

## 2020-10-28 DIAGNOSIS — G6182 Multifocal motor neuropathy: Secondary | ICD-10-CM | POA: Diagnosis not present

## 2020-10-28 DIAGNOSIS — Z9181 History of falling: Secondary | ICD-10-CM | POA: Diagnosis not present

## 2020-10-28 DIAGNOSIS — Z8673 Personal history of transient ischemic attack (TIA), and cerebral infarction without residual deficits: Secondary | ICD-10-CM | POA: Diagnosis not present

## 2020-10-28 DIAGNOSIS — R2689 Other abnormalities of gait and mobility: Secondary | ICD-10-CM | POA: Diagnosis not present

## 2020-10-28 DIAGNOSIS — M21371 Foot drop, right foot: Secondary | ICD-10-CM | POA: Diagnosis not present

## 2020-11-02 DIAGNOSIS — Z9181 History of falling: Secondary | ICD-10-CM | POA: Diagnosis not present

## 2020-11-02 DIAGNOSIS — G6182 Multifocal motor neuropathy: Secondary | ICD-10-CM | POA: Diagnosis not present

## 2020-11-02 DIAGNOSIS — G912 (Idiopathic) normal pressure hydrocephalus: Secondary | ICD-10-CM | POA: Diagnosis not present

## 2020-11-02 DIAGNOSIS — I10 Essential (primary) hypertension: Secondary | ICD-10-CM | POA: Diagnosis not present

## 2020-11-02 DIAGNOSIS — M21372 Foot drop, left foot: Secondary | ICD-10-CM | POA: Diagnosis not present

## 2020-11-02 DIAGNOSIS — E039 Hypothyroidism, unspecified: Secondary | ICD-10-CM | POA: Diagnosis not present

## 2020-11-02 DIAGNOSIS — R2689 Other abnormalities of gait and mobility: Secondary | ICD-10-CM | POA: Diagnosis not present

## 2020-11-02 DIAGNOSIS — Z8673 Personal history of transient ischemic attack (TIA), and cerebral infarction without residual deficits: Secondary | ICD-10-CM | POA: Diagnosis not present

## 2020-11-02 DIAGNOSIS — M21371 Foot drop, right foot: Secondary | ICD-10-CM | POA: Diagnosis not present

## 2020-11-02 NOTE — Telephone Encounter (Signed)
Called and was told patient was doing well, and she has had 2 falls in last week.  Has lost all confidence and shuffling feet again.  Wanting to recert patient to get her back to where she was before falls.  Ok'd PT.  Faxing paperwork for signature.

## 2020-11-02 NOTE — Telephone Encounter (Signed)
[facility] Dwyane Luo) called, request home physical therapy. Will recertify nest week. Frequency:  Twice a wk for 3 wks Once a wk for 5 wks.  Contact info: [phone]

## 2020-11-08 DIAGNOSIS — Z8673 Personal history of transient ischemic attack (TIA), and cerebral infarction without residual deficits: Secondary | ICD-10-CM | POA: Diagnosis not present

## 2020-11-08 DIAGNOSIS — M21371 Foot drop, right foot: Secondary | ICD-10-CM | POA: Diagnosis not present

## 2020-11-08 DIAGNOSIS — M21372 Foot drop, left foot: Secondary | ICD-10-CM | POA: Diagnosis not present

## 2020-11-08 DIAGNOSIS — R2689 Other abnormalities of gait and mobility: Secondary | ICD-10-CM | POA: Diagnosis not present

## 2020-11-08 DIAGNOSIS — E039 Hypothyroidism, unspecified: Secondary | ICD-10-CM | POA: Diagnosis not present

## 2020-11-08 DIAGNOSIS — Z9181 History of falling: Secondary | ICD-10-CM | POA: Diagnosis not present

## 2020-11-08 DIAGNOSIS — G912 (Idiopathic) normal pressure hydrocephalus: Secondary | ICD-10-CM | POA: Diagnosis not present

## 2020-11-08 DIAGNOSIS — I10 Essential (primary) hypertension: Secondary | ICD-10-CM | POA: Diagnosis not present

## 2020-11-08 DIAGNOSIS — G6182 Multifocal motor neuropathy: Secondary | ICD-10-CM | POA: Diagnosis not present

## 2020-11-15 DIAGNOSIS — E039 Hypothyroidism, unspecified: Secondary | ICD-10-CM | POA: Diagnosis not present

## 2020-11-15 DIAGNOSIS — I10 Essential (primary) hypertension: Secondary | ICD-10-CM | POA: Diagnosis not present

## 2020-11-15 DIAGNOSIS — E785 Hyperlipidemia, unspecified: Secondary | ICD-10-CM | POA: Diagnosis not present

## 2020-11-15 DIAGNOSIS — E7800 Pure hypercholesterolemia, unspecified: Secondary | ICD-10-CM | POA: Diagnosis not present

## 2020-11-15 DIAGNOSIS — E119 Type 2 diabetes mellitus without complications: Secondary | ICD-10-CM | POA: Diagnosis not present

## 2020-11-15 DIAGNOSIS — K219 Gastro-esophageal reflux disease without esophagitis: Secondary | ICD-10-CM | POA: Diagnosis not present

## 2020-11-15 DIAGNOSIS — E1169 Type 2 diabetes mellitus with other specified complication: Secondary | ICD-10-CM | POA: Diagnosis not present

## 2020-11-16 DIAGNOSIS — I10 Essential (primary) hypertension: Secondary | ICD-10-CM | POA: Diagnosis not present

## 2020-11-16 DIAGNOSIS — G912 (Idiopathic) normal pressure hydrocephalus: Secondary | ICD-10-CM | POA: Diagnosis not present

## 2020-11-16 DIAGNOSIS — M21372 Foot drop, left foot: Secondary | ICD-10-CM | POA: Diagnosis not present

## 2020-11-16 DIAGNOSIS — R2689 Other abnormalities of gait and mobility: Secondary | ICD-10-CM | POA: Diagnosis not present

## 2020-11-16 DIAGNOSIS — Z8673 Personal history of transient ischemic attack (TIA), and cerebral infarction without residual deficits: Secondary | ICD-10-CM | POA: Diagnosis not present

## 2020-11-16 DIAGNOSIS — M21371 Foot drop, right foot: Secondary | ICD-10-CM | POA: Diagnosis not present

## 2020-11-16 DIAGNOSIS — Z9181 History of falling: Secondary | ICD-10-CM | POA: Diagnosis not present

## 2020-11-16 DIAGNOSIS — E039 Hypothyroidism, unspecified: Secondary | ICD-10-CM | POA: Diagnosis not present

## 2020-11-16 DIAGNOSIS — G6182 Multifocal motor neuropathy: Secondary | ICD-10-CM | POA: Diagnosis not present

## 2020-11-18 DIAGNOSIS — M21372 Foot drop, left foot: Secondary | ICD-10-CM | POA: Diagnosis not present

## 2020-11-18 DIAGNOSIS — M21371 Foot drop, right foot: Secondary | ICD-10-CM | POA: Diagnosis not present

## 2020-11-18 DIAGNOSIS — Z9181 History of falling: Secondary | ICD-10-CM | POA: Diagnosis not present

## 2020-11-18 DIAGNOSIS — E039 Hypothyroidism, unspecified: Secondary | ICD-10-CM | POA: Diagnosis not present

## 2020-11-18 DIAGNOSIS — I10 Essential (primary) hypertension: Secondary | ICD-10-CM | POA: Diagnosis not present

## 2020-11-18 DIAGNOSIS — G6182 Multifocal motor neuropathy: Secondary | ICD-10-CM | POA: Diagnosis not present

## 2020-11-18 DIAGNOSIS — G912 (Idiopathic) normal pressure hydrocephalus: Secondary | ICD-10-CM | POA: Diagnosis not present

## 2020-11-18 DIAGNOSIS — R2689 Other abnormalities of gait and mobility: Secondary | ICD-10-CM | POA: Diagnosis not present

## 2020-11-18 DIAGNOSIS — Z8673 Personal history of transient ischemic attack (TIA), and cerebral infarction without residual deficits: Secondary | ICD-10-CM | POA: Diagnosis not present

## 2020-11-22 DIAGNOSIS — Z8673 Personal history of transient ischemic attack (TIA), and cerebral infarction without residual deficits: Secondary | ICD-10-CM | POA: Diagnosis not present

## 2020-11-22 DIAGNOSIS — E039 Hypothyroidism, unspecified: Secondary | ICD-10-CM | POA: Diagnosis not present

## 2020-11-22 DIAGNOSIS — G6182 Multifocal motor neuropathy: Secondary | ICD-10-CM | POA: Diagnosis not present

## 2020-11-22 DIAGNOSIS — I10 Essential (primary) hypertension: Secondary | ICD-10-CM | POA: Diagnosis not present

## 2020-11-22 DIAGNOSIS — G912 (Idiopathic) normal pressure hydrocephalus: Secondary | ICD-10-CM | POA: Diagnosis not present

## 2020-11-22 DIAGNOSIS — Z9181 History of falling: Secondary | ICD-10-CM | POA: Diagnosis not present

## 2020-11-22 DIAGNOSIS — M21371 Foot drop, right foot: Secondary | ICD-10-CM | POA: Diagnosis not present

## 2020-11-22 DIAGNOSIS — R2689 Other abnormalities of gait and mobility: Secondary | ICD-10-CM | POA: Diagnosis not present

## 2020-11-22 DIAGNOSIS — M21372 Foot drop, left foot: Secondary | ICD-10-CM | POA: Diagnosis not present

## 2020-11-24 DIAGNOSIS — G912 (Idiopathic) normal pressure hydrocephalus: Secondary | ICD-10-CM | POA: Diagnosis not present

## 2020-11-24 DIAGNOSIS — I10 Essential (primary) hypertension: Secondary | ICD-10-CM | POA: Diagnosis not present

## 2020-11-24 DIAGNOSIS — G6182 Multifocal motor neuropathy: Secondary | ICD-10-CM | POA: Diagnosis not present

## 2020-11-24 DIAGNOSIS — Z8673 Personal history of transient ischemic attack (TIA), and cerebral infarction without residual deficits: Secondary | ICD-10-CM | POA: Diagnosis not present

## 2020-11-24 DIAGNOSIS — Z9181 History of falling: Secondary | ICD-10-CM | POA: Diagnosis not present

## 2020-11-24 DIAGNOSIS — R2689 Other abnormalities of gait and mobility: Secondary | ICD-10-CM | POA: Diagnosis not present

## 2020-11-24 DIAGNOSIS — M21371 Foot drop, right foot: Secondary | ICD-10-CM | POA: Diagnosis not present

## 2020-11-24 DIAGNOSIS — M21372 Foot drop, left foot: Secondary | ICD-10-CM | POA: Diagnosis not present

## 2020-11-24 DIAGNOSIS — E039 Hypothyroidism, unspecified: Secondary | ICD-10-CM | POA: Diagnosis not present

## 2020-11-29 ENCOUNTER — Ambulatory Visit
Admission: RE | Admit: 2020-11-29 | Discharge: 2020-11-29 | Disposition: A | Discharge status: Home / Self Care | Payer: Medicare Other | Source: Ambulatory Visit | Attending: Neurology | Admitting: Neurology

## 2020-11-29 ENCOUNTER — Other Ambulatory Visit: Payer: Self-pay

## 2020-11-29 ENCOUNTER — Encounter: Payer: Self-pay | Admitting: Neurology

## 2020-11-29 ENCOUNTER — Other Ambulatory Visit: Payer: Self-pay | Admitting: Neurology

## 2020-11-29 ENCOUNTER — Ambulatory Visit: Payer: Medicare Other | Admitting: Neurology

## 2020-11-29 VITALS — BP 141/83 | HR 77 | Ht 66.5 in | Wt 183.0 lb

## 2020-11-29 DIAGNOSIS — G629 Polyneuropathy, unspecified: Secondary | ICD-10-CM | POA: Insufficient documentation

## 2020-11-29 DIAGNOSIS — G609 Hereditary and idiopathic neuropathy, unspecified: Secondary | ICD-10-CM

## 2020-11-29 DIAGNOSIS — M25521 Pain in right elbow: Secondary | ICD-10-CM

## 2020-11-29 DIAGNOSIS — S52131A Displaced fracture of neck of right radius, initial encounter for closed fracture: Secondary | ICD-10-CM | POA: Diagnosis not present

## 2020-11-29 DIAGNOSIS — M21372 Foot drop, left foot: Secondary | ICD-10-CM | POA: Diagnosis not present

## 2020-11-29 DIAGNOSIS — M21371 Foot drop, right foot: Secondary | ICD-10-CM | POA: Diagnosis not present

## 2020-11-29 DIAGNOSIS — G9389 Other specified disorders of brain: Secondary | ICD-10-CM | POA: Diagnosis not present

## 2020-11-29 DIAGNOSIS — R269 Unspecified abnormalities of gait and mobility: Secondary | ICD-10-CM

## 2020-11-29 HISTORY — DX: Polyneuropathy, unspecified: G62.9

## 2020-11-29 MED ORDER — MIRABEGRON ER 25 MG PO TB24: 25.0000 mg | ORAL_TABLET | Freq: Every day | ORAL | 3 refills | Status: DC

## 2020-11-29 NOTE — Progress Notes (Signed)
Reason for visit: Ventriculomegaly, gait disorder, peripheral neuropathy  Margaret Francis is an 87 y.o. female  History of present illness:  Margaret Francis is an 87 year old left-handed white female with a history of ventriculomegaly and probable normal pressure hydrocephalus.  The patient has a significant gait disorder but she also has sensory abnormalities in the feet with bilateral mild foot drop.  The patient was found to have a primarily motor neuropathy, she has had lumbosacral spine surgery in the past as well.  She has been getting some physical therapy with benefit, she is using a walker for ambulation.  On 20 October 2020, she fell at [employer] when someone opened the door and startled her.  The patient has had some right arm pain since that time, she has lost the ability to fully extend her arm at the elbow.  The patient has had ongoing issues with urinary frequency and incontinence.  She is not on any medications for her bladder.  In the past, she has been seen by Dr. Diona Fanti, she was on Vesicare previously but the patient does not remember whether this helped or not.  The patient has had some mild memory issues.  The patient recently turned 18.  Past Medical History:  Diagnosis Date  . Abnormality of gait 03/20/2013  . Bilateral foot-drop 07/28/2020  . Cerebral ventriculomegaly   . Gastroesophageal reflux disease   . History of stroke    Right inferior cerebellum  . Hypertension   . Osteopenia   . Stroke ([place])   . Thyroid disease   . Urinary frequency 04/19/2011  . Urinary incontinence     Past Surgical History:  Procedure Laterality Date  . ABDOMINAL HYSTERECTOMY    . APPENDECTOMY    . BACK SURGERY     x2  . BLADDER SURGERY  2010  . CHOLECYSTECTOMY    . ESOPHAGUS SURGERY     tumor removal.  . TONSILLECTOMY     at age32  . URETHRAL SLING  2010    Family History  Problem Relation Age of Onset  . Diabetes Mother   . Hypertension Mother   . Arthritis Father         died at age 32  . Breast cancer Sister 37       mastectomy    Social history:  reports that she has never smoked. She has never used smokeless tobacco. She reports that she does not drink alcohol and does not use drugs.    Allergies  Allergen Reactions  . Demerol Nausea And Vomiting  . Morphine And Related Nausea And Vomiting    Medications:  Prior to Admission medications   Medication Sig Start Date End Date Taking? Authorizing Provider  aspirin 81 MG tablet Take 81 mg by mouth daily.   Yes [provider]  levothyroxine (SYNTHROID, LEVOTHROID) 50 MCG tablet Take 50 mcg by mouth daily.   Yes [provider]  losartan (COZAAR) 50 MG tablet Take 50 mg by mouth daily.   Yes [provider]  Omeprazole (PRILOSEC PO) Take by mouth.   Yes [provider]  valsartan (DIOVAN) 320 MG tablet Take 320 mg by mouth daily.   Yes [provider]  ciprofloxacin (CIPRO) 500 MG tablet Take 1 tablet (500 mg total) by mouth 2 (two) times daily. 02/28/19   Lavonia Drafts, MD  solifenacin (VESICARE) 10 MG tablet Take 10 mg by mouth daily.    [provider]    ROS:  Out  of a complete 14 system review of symptoms, the patient complains only of the following symptoms, and all other reviewed systems are negative.  Memory troubles Walking difficulty Right arm discomfort  Blood pressure (!) 141/83, pulse 77, height 5' 6.5" (1.689 m), weight 183 lb (83 kg).  Physical Exam  General: The patient is alert and cooperative at the time of the examination.  Skin: No significant peripheral edema is noted.   Neurologic Exam  Mental status: The patient is alert and oriented x 3 at the time of the examination. The Mini-Mental status examination done today shows a total score 23/30.   Cranial nerves: Facial symmetry is present. Speech is normal, no aphasia or dysarthria is noted. Extraocular movements are full. Visual fields are full.  Motor: The  patient has good strength in all 4 extremities, with exception of mild bilateral foot drop.  Sensory examination: Soft touch sensation is symmetric on the face, arms, and legs.  Coordination: The patient has good finger-nose-finger and heel-to-shin bilaterally.  Gait and station: The patient has a wide-based gait, she can walk with assistance.  Tandem gait was not attempted.  The patient is unable to stand with her feet close together with her eyes open, she has a tendency to go backwards.  Reflexes: Deep tendon reflexes are symmetric.   Assessment/Plan:  1.  Ventriculomegaly, probable normal pressure hydrocephalus  2.  Gait disorder  3.  Urinary incontinence  4.  Mild memory disorder  The patient will be followed for the memory.  She will be given a trial on Myrbetriq for the bladder, she will call for any dose adjustments.  She will be sent for an x-ray of her right arm, she has had some arm pain since her fall on 20 October 2020, she is not able to fully extend the arm at the elbow.  She will follow up here in 6 months.  Jill Alexanders MD 11/29/2020 10:08 AM  [facility] [address] [place], [place] [address]  Phone [phone] Fax [phone]

## 2020-11-29 NOTE — Telephone Encounter (Signed)
Faxed signed POC for certification period 11/12/20-01/10/21  Received ok transmission

## 2020-11-30 ENCOUNTER — Telehealth: Payer: Self-pay | Admitting: Neurology

## 2020-11-30 DIAGNOSIS — R2689 Other abnormalities of gait and mobility: Secondary | ICD-10-CM | POA: Diagnosis not present

## 2020-11-30 DIAGNOSIS — Z8673 Personal history of transient ischemic attack (TIA), and cerebral infarction without residual deficits: Secondary | ICD-10-CM | POA: Diagnosis not present

## 2020-11-30 DIAGNOSIS — M21371 Foot drop, right foot: Secondary | ICD-10-CM | POA: Diagnosis not present

## 2020-11-30 DIAGNOSIS — I10 Essential (primary) hypertension: Secondary | ICD-10-CM | POA: Diagnosis not present

## 2020-11-30 DIAGNOSIS — Z9181 History of falling: Secondary | ICD-10-CM | POA: Diagnosis not present

## 2020-11-30 DIAGNOSIS — G6182 Multifocal motor neuropathy: Secondary | ICD-10-CM | POA: Diagnosis not present

## 2020-11-30 DIAGNOSIS — G912 (Idiopathic) normal pressure hydrocephalus: Secondary | ICD-10-CM | POA: Diagnosis not present

## 2020-11-30 DIAGNOSIS — M21372 Foot drop, left foot: Secondary | ICD-10-CM | POA: Diagnosis not present

## 2020-11-30 DIAGNOSIS — E039 Hypothyroidism, unspecified: Secondary | ICD-10-CM | POA: Diagnosis not present

## 2020-11-30 NOTE — Telephone Encounter (Signed)
I called and talk with the daughter.  X-ray of the elbow does show a fracture, if the patient does want to see a orthopedic surgeon, she is to let me know.  The patient does have some limitation of range of movement at the elbow related to the fracture.   XR right elbow 11/29/20:  IMPRESSION: Acute minimally displaced, slightly impacted fracture of the neck of the radius without definitive intra-articular extension.

## 2020-12-07 DIAGNOSIS — I10 Essential (primary) hypertension: Secondary | ICD-10-CM | POA: Diagnosis not present

## 2020-12-07 DIAGNOSIS — G912 (Idiopathic) normal pressure hydrocephalus: Secondary | ICD-10-CM | POA: Diagnosis not present

## 2020-12-07 DIAGNOSIS — M21372 Foot drop, left foot: Secondary | ICD-10-CM | POA: Diagnosis not present

## 2020-12-07 DIAGNOSIS — E039 Hypothyroidism, unspecified: Secondary | ICD-10-CM | POA: Diagnosis not present

## 2020-12-07 DIAGNOSIS — G6182 Multifocal motor neuropathy: Secondary | ICD-10-CM | POA: Diagnosis not present

## 2020-12-07 DIAGNOSIS — M21371 Foot drop, right foot: Secondary | ICD-10-CM | POA: Diagnosis not present

## 2020-12-07 DIAGNOSIS — Z8673 Personal history of transient ischemic attack (TIA), and cerebral infarction without residual deficits: Secondary | ICD-10-CM | POA: Diagnosis not present

## 2020-12-07 DIAGNOSIS — R2689 Other abnormalities of gait and mobility: Secondary | ICD-10-CM | POA: Diagnosis not present

## 2020-12-07 DIAGNOSIS — Z9181 History of falling: Secondary | ICD-10-CM | POA: Diagnosis not present

## 2020-12-13 DIAGNOSIS — I10 Essential (primary) hypertension: Secondary | ICD-10-CM | POA: Diagnosis not present

## 2020-12-13 DIAGNOSIS — E039 Hypothyroidism, unspecified: Secondary | ICD-10-CM | POA: Diagnosis not present

## 2020-12-13 DIAGNOSIS — R2689 Other abnormalities of gait and mobility: Secondary | ICD-10-CM | POA: Diagnosis not present

## 2020-12-13 DIAGNOSIS — Z9181 History of falling: Secondary | ICD-10-CM | POA: Diagnosis not present

## 2020-12-13 DIAGNOSIS — M21372 Foot drop, left foot: Secondary | ICD-10-CM | POA: Diagnosis not present

## 2020-12-13 DIAGNOSIS — G6182 Multifocal motor neuropathy: Secondary | ICD-10-CM | POA: Diagnosis not present

## 2020-12-13 DIAGNOSIS — M21371 Foot drop, right foot: Secondary | ICD-10-CM | POA: Diagnosis not present

## 2020-12-13 DIAGNOSIS — Z8673 Personal history of transient ischemic attack (TIA), and cerebral infarction without residual deficits: Secondary | ICD-10-CM | POA: Diagnosis not present

## 2020-12-13 DIAGNOSIS — G912 (Idiopathic) normal pressure hydrocephalus: Secondary | ICD-10-CM | POA: Diagnosis not present

## 2020-12-21 DIAGNOSIS — Z8673 Personal history of transient ischemic attack (TIA), and cerebral infarction without residual deficits: Secondary | ICD-10-CM | POA: Diagnosis not present

## 2020-12-21 DIAGNOSIS — G912 (Idiopathic) normal pressure hydrocephalus: Secondary | ICD-10-CM | POA: Diagnosis not present

## 2020-12-21 DIAGNOSIS — M21371 Foot drop, right foot: Secondary | ICD-10-CM | POA: Diagnosis not present

## 2020-12-21 DIAGNOSIS — Z9181 History of falling: Secondary | ICD-10-CM | POA: Diagnosis not present

## 2020-12-21 DIAGNOSIS — I10 Essential (primary) hypertension: Secondary | ICD-10-CM | POA: Diagnosis not present

## 2020-12-21 DIAGNOSIS — G6182 Multifocal motor neuropathy: Secondary | ICD-10-CM | POA: Diagnosis not present

## 2020-12-21 DIAGNOSIS — M21372 Foot drop, left foot: Secondary | ICD-10-CM | POA: Diagnosis not present

## 2020-12-21 DIAGNOSIS — R2689 Other abnormalities of gait and mobility: Secondary | ICD-10-CM | POA: Diagnosis not present

## 2020-12-21 DIAGNOSIS — E039 Hypothyroidism, unspecified: Secondary | ICD-10-CM | POA: Diagnosis not present

## 2020-12-27 NOTE — Telephone Encounter (Signed)
PT Certification signed and faxed back for certification period ending 01/10/21  OK transmission received

## 2020-12-28 DIAGNOSIS — G912 (Idiopathic) normal pressure hydrocephalus: Secondary | ICD-10-CM | POA: Diagnosis not present

## 2020-12-28 DIAGNOSIS — I10 Essential (primary) hypertension: Secondary | ICD-10-CM | POA: Diagnosis not present

## 2020-12-28 DIAGNOSIS — Z8673 Personal history of transient ischemic attack (TIA), and cerebral infarction without residual deficits: Secondary | ICD-10-CM | POA: Diagnosis not present

## 2020-12-28 DIAGNOSIS — M21372 Foot drop, left foot: Secondary | ICD-10-CM | POA: Diagnosis not present

## 2020-12-28 DIAGNOSIS — G6182 Multifocal motor neuropathy: Secondary | ICD-10-CM | POA: Diagnosis not present

## 2020-12-28 DIAGNOSIS — M21371 Foot drop, right foot: Secondary | ICD-10-CM | POA: Diagnosis not present

## 2020-12-28 DIAGNOSIS — E039 Hypothyroidism, unspecified: Secondary | ICD-10-CM | POA: Diagnosis not present

## 2020-12-28 DIAGNOSIS — R2689 Other abnormalities of gait and mobility: Secondary | ICD-10-CM | POA: Diagnosis not present

## 2020-12-28 DIAGNOSIS — Z9181 History of falling: Secondary | ICD-10-CM | POA: Diagnosis not present

## 2021-01-10 ENCOUNTER — Telehealth: Payer: Self-pay | Admitting: Neurology

## 2021-01-10 DIAGNOSIS — E039 Hypothyroidism, unspecified: Secondary | ICD-10-CM | POA: Diagnosis not present

## 2021-01-10 DIAGNOSIS — G912 (Idiopathic) normal pressure hydrocephalus: Secondary | ICD-10-CM | POA: Diagnosis not present

## 2021-01-10 DIAGNOSIS — E785 Hyperlipidemia, unspecified: Secondary | ICD-10-CM | POA: Diagnosis not present

## 2021-01-10 DIAGNOSIS — M21371 Foot drop, right foot: Secondary | ICD-10-CM | POA: Diagnosis not present

## 2021-01-10 DIAGNOSIS — E119 Type 2 diabetes mellitus without complications: Secondary | ICD-10-CM | POA: Diagnosis not present

## 2021-01-10 DIAGNOSIS — E1169 Type 2 diabetes mellitus with other specified complication: Secondary | ICD-10-CM | POA: Diagnosis not present

## 2021-01-10 DIAGNOSIS — I10 Essential (primary) hypertension: Secondary | ICD-10-CM | POA: Diagnosis not present

## 2021-01-10 DIAGNOSIS — E7800 Pure hypercholesterolemia, unspecified: Secondary | ICD-10-CM | POA: Diagnosis not present

## 2021-01-10 DIAGNOSIS — M21372 Foot drop, left foot: Secondary | ICD-10-CM | POA: Diagnosis not present

## 2021-01-10 DIAGNOSIS — Z8673 Personal history of transient ischemic attack (TIA), and cerebral infarction without residual deficits: Secondary | ICD-10-CM | POA: Diagnosis not present

## 2021-01-10 DIAGNOSIS — K219 Gastro-esophageal reflux disease without esophagitis: Secondary | ICD-10-CM | POA: Diagnosis not present

## 2021-01-10 DIAGNOSIS — R2689 Other abnormalities of gait and mobility: Secondary | ICD-10-CM | POA: Diagnosis not present

## 2021-01-10 DIAGNOSIS — G6182 Multifocal motor neuropathy: Secondary | ICD-10-CM | POA: Diagnosis not present

## 2021-01-10 DIAGNOSIS — Z9181 History of falling: Secondary | ICD-10-CM | POA: Diagnosis not present

## 2021-01-10 DIAGNOSIS — E139 Other specified diabetes mellitus without complications: Secondary | ICD-10-CM | POA: Diagnosis not present

## 2021-01-10 MED ORDER — MIRABEGRON ER 50 MG PO TB24: 50.0000 mg | ORAL_TABLET | Freq: Every day | ORAL | 3 refills | Status: DC

## 2021-01-10 NOTE — Telephone Encounter (Signed)
cockman, heather(on DPR) has called to report that mirabegron ER (MYRBETRIQ) 25 MG TB24 tablet is working for pt, she's like to see about an increase from the 25mg , please call.

## 2021-01-10 NOTE — Telephone Encounter (Signed)
I called and talk with the daughter.  The lower dose of Myrbetriq 25 mg does seem to help the bladder but seems to wear off in the evening, we will go up to the 50 mg extended release tablet.

## 2021-01-10 NOTE — Addendum Note (Signed)
Addended by: Kathrynn Ducking on: 01/10/2021 03:55 PM   Modules accepted: Orders

## 2021-01-12 NOTE — Telephone Encounter (Signed)
Faxed recertification visit for PT.    OK transmission received

## 2021-01-20 DIAGNOSIS — G6182 Multifocal motor neuropathy: Secondary | ICD-10-CM | POA: Diagnosis not present

## 2021-01-20 DIAGNOSIS — M21371 Foot drop, right foot: Secondary | ICD-10-CM | POA: Diagnosis not present

## 2021-01-20 DIAGNOSIS — G912 (Idiopathic) normal pressure hydrocephalus: Secondary | ICD-10-CM | POA: Diagnosis not present

## 2021-01-20 DIAGNOSIS — M21372 Foot drop, left foot: Secondary | ICD-10-CM | POA: Diagnosis not present

## 2021-01-20 DIAGNOSIS — I10 Essential (primary) hypertension: Secondary | ICD-10-CM | POA: Diagnosis not present

## 2021-01-20 DIAGNOSIS — E039 Hypothyroidism, unspecified: Secondary | ICD-10-CM | POA: Diagnosis not present

## 2021-01-20 DIAGNOSIS — Z8673 Personal history of transient ischemic attack (TIA), and cerebral infarction without residual deficits: Secondary | ICD-10-CM | POA: Diagnosis not present

## 2021-01-20 DIAGNOSIS — Z9181 History of falling: Secondary | ICD-10-CM | POA: Diagnosis not present

## 2021-01-20 DIAGNOSIS — R2689 Other abnormalities of gait and mobility: Secondary | ICD-10-CM | POA: Diagnosis not present

## 2021-01-26 DIAGNOSIS — G6182 Multifocal motor neuropathy: Secondary | ICD-10-CM | POA: Diagnosis not present

## 2021-01-26 DIAGNOSIS — E039 Hypothyroidism, unspecified: Secondary | ICD-10-CM | POA: Diagnosis not present

## 2021-01-26 DIAGNOSIS — Z8673 Personal history of transient ischemic attack (TIA), and cerebral infarction without residual deficits: Secondary | ICD-10-CM | POA: Diagnosis not present

## 2021-01-26 DIAGNOSIS — M21371 Foot drop, right foot: Secondary | ICD-10-CM | POA: Diagnosis not present

## 2021-01-26 DIAGNOSIS — G912 (Idiopathic) normal pressure hydrocephalus: Secondary | ICD-10-CM | POA: Diagnosis not present

## 2021-01-26 DIAGNOSIS — M21372 Foot drop, left foot: Secondary | ICD-10-CM | POA: Diagnosis not present

## 2021-01-26 DIAGNOSIS — Z9181 History of falling: Secondary | ICD-10-CM | POA: Diagnosis not present

## 2021-01-26 DIAGNOSIS — R2689 Other abnormalities of gait and mobility: Secondary | ICD-10-CM | POA: Diagnosis not present

## 2021-01-26 DIAGNOSIS — I10 Essential (primary) hypertension: Secondary | ICD-10-CM | POA: Diagnosis not present

## 2021-01-26 NOTE — Telephone Encounter (Signed)
Signed PT orders faxed for cert period ending 2/54/86  OK transmission received

## 2021-02-02 DIAGNOSIS — R2689 Other abnormalities of gait and mobility: Secondary | ICD-10-CM | POA: Diagnosis not present

## 2021-02-02 DIAGNOSIS — G6182 Multifocal motor neuropathy: Secondary | ICD-10-CM | POA: Diagnosis not present

## 2021-02-02 DIAGNOSIS — Z9181 History of falling: Secondary | ICD-10-CM | POA: Diagnosis not present

## 2021-02-02 DIAGNOSIS — M21372 Foot drop, left foot: Secondary | ICD-10-CM | POA: Diagnosis not present

## 2021-02-02 DIAGNOSIS — M21371 Foot drop, right foot: Secondary | ICD-10-CM | POA: Diagnosis not present

## 2021-02-02 DIAGNOSIS — G912 (Idiopathic) normal pressure hydrocephalus: Secondary | ICD-10-CM | POA: Diagnosis not present

## 2021-02-02 DIAGNOSIS — Z8673 Personal history of transient ischemic attack (TIA), and cerebral infarction without residual deficits: Secondary | ICD-10-CM | POA: Diagnosis not present

## 2021-02-02 DIAGNOSIS — E039 Hypothyroidism, unspecified: Secondary | ICD-10-CM | POA: Diagnosis not present

## 2021-02-02 DIAGNOSIS — I10 Essential (primary) hypertension: Secondary | ICD-10-CM | POA: Diagnosis not present

## 2021-02-07 DIAGNOSIS — E210 Primary hyperparathyroidism: Secondary | ICD-10-CM | POA: Diagnosis not present

## 2021-02-07 DIAGNOSIS — E7800 Pure hypercholesterolemia, unspecified: Secondary | ICD-10-CM | POA: Diagnosis not present

## 2021-02-07 DIAGNOSIS — I10 Essential (primary) hypertension: Secondary | ICD-10-CM | POA: Diagnosis not present

## 2021-02-07 DIAGNOSIS — G629 Polyneuropathy, unspecified: Secondary | ICD-10-CM | POA: Diagnosis not present

## 2021-02-07 DIAGNOSIS — E1169 Type 2 diabetes mellitus with other specified complication: Secondary | ICD-10-CM | POA: Diagnosis not present

## 2021-02-07 DIAGNOSIS — G3184 Mild cognitive impairment, so stated: Secondary | ICD-10-CM | POA: Diagnosis not present

## 2021-02-07 DIAGNOSIS — E039 Hypothyroidism, unspecified: Secondary | ICD-10-CM | POA: Diagnosis not present

## 2021-02-08 DIAGNOSIS — I10 Essential (primary) hypertension: Secondary | ICD-10-CM | POA: Diagnosis not present

## 2021-02-08 DIAGNOSIS — Z9181 History of falling: Secondary | ICD-10-CM | POA: Diagnosis not present

## 2021-02-08 DIAGNOSIS — E039 Hypothyroidism, unspecified: Secondary | ICD-10-CM | POA: Diagnosis not present

## 2021-02-08 DIAGNOSIS — R2689 Other abnormalities of gait and mobility: Secondary | ICD-10-CM | POA: Diagnosis not present

## 2021-02-08 DIAGNOSIS — M21371 Foot drop, right foot: Secondary | ICD-10-CM | POA: Diagnosis not present

## 2021-02-08 DIAGNOSIS — G912 (Idiopathic) normal pressure hydrocephalus: Secondary | ICD-10-CM | POA: Diagnosis not present

## 2021-02-08 DIAGNOSIS — Z8673 Personal history of transient ischemic attack (TIA), and cerebral infarction without residual deficits: Secondary | ICD-10-CM | POA: Diagnosis not present

## 2021-02-08 DIAGNOSIS — G6182 Multifocal motor neuropathy: Secondary | ICD-10-CM | POA: Diagnosis not present

## 2021-02-08 DIAGNOSIS — M21372 Foot drop, left foot: Secondary | ICD-10-CM | POA: Diagnosis not present

## 2021-02-16 DIAGNOSIS — G912 (Idiopathic) normal pressure hydrocephalus: Secondary | ICD-10-CM | POA: Diagnosis not present

## 2021-02-16 DIAGNOSIS — M21372 Foot drop, left foot: Secondary | ICD-10-CM | POA: Diagnosis not present

## 2021-02-16 DIAGNOSIS — I10 Essential (primary) hypertension: Secondary | ICD-10-CM | POA: Diagnosis not present

## 2021-02-16 DIAGNOSIS — G6182 Multifocal motor neuropathy: Secondary | ICD-10-CM | POA: Diagnosis not present

## 2021-02-16 DIAGNOSIS — R2689 Other abnormalities of gait and mobility: Secondary | ICD-10-CM | POA: Diagnosis not present

## 2021-02-16 DIAGNOSIS — Z9181 History of falling: Secondary | ICD-10-CM | POA: Diagnosis not present

## 2021-02-16 DIAGNOSIS — Z8673 Personal history of transient ischemic attack (TIA), and cerebral infarction without residual deficits: Secondary | ICD-10-CM | POA: Diagnosis not present

## 2021-02-16 DIAGNOSIS — M21371 Foot drop, right foot: Secondary | ICD-10-CM | POA: Diagnosis not present

## 2021-02-16 DIAGNOSIS — E039 Hypothyroidism, unspecified: Secondary | ICD-10-CM | POA: Diagnosis not present

## 2021-02-22 DIAGNOSIS — R7989 Other specified abnormal findings of blood chemistry: Secondary | ICD-10-CM | POA: Diagnosis not present

## 2021-02-23 DIAGNOSIS — Z8673 Personal history of transient ischemic attack (TIA), and cerebral infarction without residual deficits: Secondary | ICD-10-CM | POA: Diagnosis not present

## 2021-02-23 DIAGNOSIS — G912 (Idiopathic) normal pressure hydrocephalus: Secondary | ICD-10-CM | POA: Diagnosis not present

## 2021-02-23 DIAGNOSIS — I10 Essential (primary) hypertension: Secondary | ICD-10-CM | POA: Diagnosis not present

## 2021-02-23 DIAGNOSIS — Z9181 History of falling: Secondary | ICD-10-CM | POA: Diagnosis not present

## 2021-02-23 DIAGNOSIS — E039 Hypothyroidism, unspecified: Secondary | ICD-10-CM | POA: Diagnosis not present

## 2021-02-23 DIAGNOSIS — G6182 Multifocal motor neuropathy: Secondary | ICD-10-CM | POA: Diagnosis not present

## 2021-02-23 DIAGNOSIS — M21372 Foot drop, left foot: Secondary | ICD-10-CM | POA: Diagnosis not present

## 2021-02-23 DIAGNOSIS — R2689 Other abnormalities of gait and mobility: Secondary | ICD-10-CM | POA: Diagnosis not present

## 2021-02-23 DIAGNOSIS — M21371 Foot drop, right foot: Secondary | ICD-10-CM | POA: Diagnosis not present

## 2021-03-02 DIAGNOSIS — Z9181 History of falling: Secondary | ICD-10-CM | POA: Diagnosis not present

## 2021-03-02 DIAGNOSIS — G912 (Idiopathic) normal pressure hydrocephalus: Secondary | ICD-10-CM | POA: Diagnosis not present

## 2021-03-02 DIAGNOSIS — G6182 Multifocal motor neuropathy: Secondary | ICD-10-CM | POA: Diagnosis not present

## 2021-03-02 DIAGNOSIS — M21372 Foot drop, left foot: Secondary | ICD-10-CM | POA: Diagnosis not present

## 2021-03-02 DIAGNOSIS — I10 Essential (primary) hypertension: Secondary | ICD-10-CM | POA: Diagnosis not present

## 2021-03-02 DIAGNOSIS — R2689 Other abnormalities of gait and mobility: Secondary | ICD-10-CM | POA: Diagnosis not present

## 2021-03-02 DIAGNOSIS — M21371 Foot drop, right foot: Secondary | ICD-10-CM | POA: Diagnosis not present

## 2021-03-02 DIAGNOSIS — Z8673 Personal history of transient ischemic attack (TIA), and cerebral infarction without residual deficits: Secondary | ICD-10-CM | POA: Diagnosis not present

## 2021-03-02 DIAGNOSIS — E039 Hypothyroidism, unspecified: Secondary | ICD-10-CM | POA: Diagnosis not present

## 2021-03-08 DIAGNOSIS — I10 Essential (primary) hypertension: Secondary | ICD-10-CM | POA: Diagnosis not present

## 2021-03-08 DIAGNOSIS — E039 Hypothyroidism, unspecified: Secondary | ICD-10-CM | POA: Diagnosis not present

## 2021-03-08 DIAGNOSIS — R2689 Other abnormalities of gait and mobility: Secondary | ICD-10-CM | POA: Diagnosis not present

## 2021-03-08 DIAGNOSIS — Z8673 Personal history of transient ischemic attack (TIA), and cerebral infarction without residual deficits: Secondary | ICD-10-CM | POA: Diagnosis not present

## 2021-03-08 DIAGNOSIS — G912 (Idiopathic) normal pressure hydrocephalus: Secondary | ICD-10-CM | POA: Diagnosis not present

## 2021-03-08 DIAGNOSIS — M21371 Foot drop, right foot: Secondary | ICD-10-CM | POA: Diagnosis not present

## 2021-03-08 DIAGNOSIS — G6182 Multifocal motor neuropathy: Secondary | ICD-10-CM | POA: Diagnosis not present

## 2021-03-08 DIAGNOSIS — Z9181 History of falling: Secondary | ICD-10-CM | POA: Diagnosis not present

## 2021-03-08 DIAGNOSIS — M21372 Foot drop, left foot: Secondary | ICD-10-CM | POA: Diagnosis not present

## 2021-04-28 DIAGNOSIS — I10 Essential (primary) hypertension: Secondary | ICD-10-CM | POA: Diagnosis not present

## 2021-04-28 DIAGNOSIS — E119 Type 2 diabetes mellitus without complications: Secondary | ICD-10-CM | POA: Diagnosis not present

## 2021-04-28 DIAGNOSIS — E039 Hypothyroidism, unspecified: Secondary | ICD-10-CM | POA: Diagnosis not present

## 2021-04-28 DIAGNOSIS — K219 Gastro-esophageal reflux disease without esophagitis: Secondary | ICD-10-CM | POA: Diagnosis not present

## 2021-04-28 DIAGNOSIS — E1169 Type 2 diabetes mellitus with other specified complication: Secondary | ICD-10-CM | POA: Diagnosis not present

## 2021-04-28 DIAGNOSIS — E785 Hyperlipidemia, unspecified: Secondary | ICD-10-CM | POA: Diagnosis not present

## 2021-04-28 DIAGNOSIS — E7800 Pure hypercholesterolemia, unspecified: Secondary | ICD-10-CM | POA: Diagnosis not present

## 2021-05-23 ENCOUNTER — Other Ambulatory Visit: Payer: Self-pay | Admitting: Neurology

## 2021-05-30 ENCOUNTER — Encounter: Payer: Self-pay | Admitting: Neurology

## 2021-05-30 ENCOUNTER — Ambulatory Visit: Payer: Medicare Other | Admitting: Neurology

## 2021-05-30 VITALS — BP 137/82 | HR 79 | Ht 67.0 in | Wt 191.0 lb

## 2021-05-30 DIAGNOSIS — G609 Hereditary and idiopathic neuropathy, unspecified: Secondary | ICD-10-CM | POA: Diagnosis not present

## 2021-05-30 DIAGNOSIS — M21371 Foot drop, right foot: Secondary | ICD-10-CM

## 2021-05-30 DIAGNOSIS — R269 Unspecified abnormalities of gait and mobility: Secondary | ICD-10-CM | POA: Diagnosis not present

## 2021-05-30 DIAGNOSIS — M21372 Foot drop, left foot: Secondary | ICD-10-CM

## 2021-05-30 MED ORDER — MIRABEGRON ER 50 MG PO TB24: 50.0000 mg | ORAL_TABLET | Freq: Every day | ORAL | 3 refills | Status: DC

## 2021-05-30 NOTE — Progress Notes (Signed)
Reason for visit: Gait disorder, ventriculomegaly, peripheral neuropathy  Margaret Francis is an 87 y.o. female  History of present illness:  Margaret Francis is an 87 year old left-handed white female with a history of a prominent gait disorder.  The patient has been at risk for falls, she has not fallen since last seen.  She underwent physical therapy which ended in July 2022.  She uses a walker for ambulation.  She stays alone at night, she has a bedside commode and does not have to walk to go to the bathroom.  She is on Myrbetriq 50 mg which seems to help her urinary incontinence some.  She did fall prior to her last visit, she fractured her right elbow but this healed up on its own, she is not having any pain or discomfort with this.  She comes to this office for further evaluation.  Past Medical History:  Diagnosis Date   Abnormality of gait 03/20/2013   Bilateral foot-drop 07/28/2020   Cerebral ventriculomegaly    Gastroesophageal reflux disease    History of stroke    Right inferior cerebellum   Hypertension    Osteopenia    Peripheral neuropathy 11/29/2020   Stroke [facility])    Thyroid disease    Urinary frequency 04/19/2011   Urinary incontinence     Past Surgical History:  Procedure Laterality Date   ABDOMINAL HYSTERECTOMY     APPENDECTOMY     BACK SURGERY     x2   BLADDER SURGERY  2010   CHOLECYSTECTOMY     ESOPHAGUS SURGERY     tumor removal.   TONSILLECTOMY     at age32   URETHRAL SLING  2010    Family History  Problem Relation Age of Onset   Diabetes Mother    Hypertension Mother    Arthritis Father        died at age 45   Breast cancer Sister 35       mastectomy    Social history:  reports that she has never smoked. She has never used smokeless tobacco. She reports that she does not drink alcohol and does not use drugs.    Allergies  Allergen Reactions   Demerol Nausea And Vomiting   Morphine And Related Nausea And Vomiting    Medications:  Prior to  Admission medications   Medication Sig Start Date End Date Taking? Authorizing Provider  aspirin 81 MG tablet Take 81 mg by mouth daily.   Yes [provider]  levothyroxine (SYNTHROID, LEVOTHROID) 50 MCG tablet Take 50 mcg by mouth daily.   Yes [provider]  losartan (COZAAR) 50 MG tablet Take 50 mg by mouth daily.   Yes [provider]  MYRBETRIQ 50 MG TB24 tablet TAKE 1 TABLET (50 MG TOTAL) BY MOUTH DAILY. 05/23/21  Yes Kathrynn Ducking, MD  Omeprazole (PRILOSEC PO) Take by mouth.   Yes [provider]  valsartan (DIOVAN) 320 MG tablet Take 320 mg by mouth daily.   Yes [provider]    ROS:  Out of a complete 14 system review of symptoms, the patient complains only of the following symptoms, and all other reviewed systems are negative.  Walking difficulty Urinary incontinence  Blood pressure 137/82, pulse 79, height 5\' 7"  (1.702 m), weight 191 lb (86.6 kg).  Physical Exam  General: The patient is alert and cooperative at the time of the examination.  Skin: No significant peripheral edema is noted.   Neurologic Exam  Mental  status: The patient is alert and oriented x 3 at the time of the examination. The patient has apparent normal recent and remote memory, with an apparently normal attention span and concentration ability.   Cranial nerves: Facial symmetry is present. Speech is normal, no aphasia or dysarthria is noted. Extraocular movements are full. Visual fields are full.  Motor: The patient has good strength in all 4 extremities, with exception of bilateral foot drop.  Sensory examination: Soft touch sensation is symmetric on the face, arms, and legs.  Coordination: The patient has good finger-nose-finger and heel-to-shin bilaterally.  Gait and station: The patient has a wide-based gait, unsteady ambulation, she can ambulate with examiner.  Normally, she would use a walker.  Reflexes: Deep tendon reflexes are  symmetric.   Assessment/Plan:  1.  Gait disturbance  2.  Peripheral neuropathy  3.  Ventriculomegaly  The patient is stable with her ability to ambulate.  She we will continue the Myrbetriq for the bladder.  She will follow-up here in 6 months.  She may be able to restart home health physical therapy in January 2023.  We will follow her gait issues over time.  Jill Alexanders MD 05/30/2021 11:20 AM  [facility] [address] [place], [place] [address]  Phone [phone] Fax [phone]

## 2021-07-08 DIAGNOSIS — E1169 Type 2 diabetes mellitus with other specified complication: Secondary | ICD-10-CM | POA: Diagnosis not present

## 2021-07-08 DIAGNOSIS — G629 Polyneuropathy, unspecified: Secondary | ICD-10-CM | POA: Diagnosis not present

## 2021-07-08 DIAGNOSIS — E7800 Pure hypercholesterolemia, unspecified: Secondary | ICD-10-CM | POA: Diagnosis not present

## 2021-07-08 DIAGNOSIS — R269 Unspecified abnormalities of gait and mobility: Secondary | ICD-10-CM | POA: Diagnosis not present

## 2021-07-08 DIAGNOSIS — E210 Primary hyperparathyroidism: Secondary | ICD-10-CM | POA: Diagnosis not present

## 2021-07-08 DIAGNOSIS — I10 Essential (primary) hypertension: Secondary | ICD-10-CM | POA: Diagnosis not present

## 2021-07-26 DIAGNOSIS — E1169 Type 2 diabetes mellitus with other specified complication: Secondary | ICD-10-CM | POA: Diagnosis not present

## 2021-07-26 DIAGNOSIS — E210 Primary hyperparathyroidism: Secondary | ICD-10-CM | POA: Diagnosis not present

## 2021-07-26 DIAGNOSIS — E1136 Type 2 diabetes mellitus with diabetic cataract: Secondary | ICD-10-CM | POA: Diagnosis not present

## 2021-07-26 DIAGNOSIS — I10 Essential (primary) hypertension: Secondary | ICD-10-CM | POA: Diagnosis not present

## 2021-07-26 DIAGNOSIS — E1142 Type 2 diabetes mellitus with diabetic polyneuropathy: Secondary | ICD-10-CM | POA: Diagnosis not present

## 2021-07-26 DIAGNOSIS — E7800 Pure hypercholesterolemia, unspecified: Secondary | ICD-10-CM | POA: Diagnosis not present

## 2021-07-28 DIAGNOSIS — I10 Essential (primary) hypertension: Secondary | ICD-10-CM | POA: Diagnosis not present

## 2021-07-28 DIAGNOSIS — E210 Primary hyperparathyroidism: Secondary | ICD-10-CM | POA: Diagnosis not present

## 2021-07-28 DIAGNOSIS — E1142 Type 2 diabetes mellitus with diabetic polyneuropathy: Secondary | ICD-10-CM | POA: Diagnosis not present

## 2021-07-28 DIAGNOSIS — E1136 Type 2 diabetes mellitus with diabetic cataract: Secondary | ICD-10-CM | POA: Diagnosis not present

## 2021-07-28 DIAGNOSIS — E7800 Pure hypercholesterolemia, unspecified: Secondary | ICD-10-CM | POA: Diagnosis not present

## 2021-07-28 DIAGNOSIS — E1169 Type 2 diabetes mellitus with other specified complication: Secondary | ICD-10-CM | POA: Diagnosis not present

## 2021-08-01 DIAGNOSIS — E785 Hyperlipidemia, unspecified: Secondary | ICD-10-CM | POA: Diagnosis not present

## 2021-08-01 DIAGNOSIS — I10 Essential (primary) hypertension: Secondary | ICD-10-CM | POA: Diagnosis not present

## 2021-08-01 DIAGNOSIS — E1136 Type 2 diabetes mellitus with diabetic cataract: Secondary | ICD-10-CM | POA: Diagnosis not present

## 2021-08-01 DIAGNOSIS — E1169 Type 2 diabetes mellitus with other specified complication: Secondary | ICD-10-CM | POA: Diagnosis not present

## 2021-08-01 DIAGNOSIS — E7800 Pure hypercholesterolemia, unspecified: Secondary | ICD-10-CM | POA: Diagnosis not present

## 2021-08-01 DIAGNOSIS — E210 Primary hyperparathyroidism: Secondary | ICD-10-CM | POA: Diagnosis not present

## 2021-08-01 DIAGNOSIS — E139 Other specified diabetes mellitus without complications: Secondary | ICD-10-CM | POA: Diagnosis not present

## 2021-08-01 DIAGNOSIS — K219 Gastro-esophageal reflux disease without esophagitis: Secondary | ICD-10-CM | POA: Diagnosis not present

## 2021-08-01 DIAGNOSIS — E039 Hypothyroidism, unspecified: Secondary | ICD-10-CM | POA: Diagnosis not present

## 2021-08-01 DIAGNOSIS — E1142 Type 2 diabetes mellitus with diabetic polyneuropathy: Secondary | ICD-10-CM | POA: Diagnosis not present

## 2021-08-01 DIAGNOSIS — E119 Type 2 diabetes mellitus without complications: Secondary | ICD-10-CM | POA: Diagnosis not present

## 2021-08-04 DIAGNOSIS — E7800 Pure hypercholesterolemia, unspecified: Secondary | ICD-10-CM | POA: Diagnosis not present

## 2021-08-04 DIAGNOSIS — E1142 Type 2 diabetes mellitus with diabetic polyneuropathy: Secondary | ICD-10-CM | POA: Diagnosis not present

## 2021-08-04 DIAGNOSIS — E1136 Type 2 diabetes mellitus with diabetic cataract: Secondary | ICD-10-CM | POA: Diagnosis not present

## 2021-08-04 DIAGNOSIS — I10 Essential (primary) hypertension: Secondary | ICD-10-CM | POA: Diagnosis not present

## 2021-08-04 DIAGNOSIS — E1169 Type 2 diabetes mellitus with other specified complication: Secondary | ICD-10-CM | POA: Diagnosis not present

## 2021-08-04 DIAGNOSIS — E210 Primary hyperparathyroidism: Secondary | ICD-10-CM | POA: Diagnosis not present

## 2021-08-11 DIAGNOSIS — E210 Primary hyperparathyroidism: Secondary | ICD-10-CM | POA: Diagnosis not present

## 2021-08-11 DIAGNOSIS — E7800 Pure hypercholesterolemia, unspecified: Secondary | ICD-10-CM | POA: Diagnosis not present

## 2021-08-11 DIAGNOSIS — E1142 Type 2 diabetes mellitus with diabetic polyneuropathy: Secondary | ICD-10-CM | POA: Diagnosis not present

## 2021-08-11 DIAGNOSIS — E1169 Type 2 diabetes mellitus with other specified complication: Secondary | ICD-10-CM | POA: Diagnosis not present

## 2021-08-11 DIAGNOSIS — E1136 Type 2 diabetes mellitus with diabetic cataract: Secondary | ICD-10-CM | POA: Diagnosis not present

## 2021-08-11 DIAGNOSIS — I10 Essential (primary) hypertension: Secondary | ICD-10-CM | POA: Diagnosis not present

## 2021-08-17 DIAGNOSIS — I10 Essential (primary) hypertension: Secondary | ICD-10-CM | POA: Diagnosis not present

## 2021-08-17 DIAGNOSIS — E1136 Type 2 diabetes mellitus with diabetic cataract: Secondary | ICD-10-CM | POA: Diagnosis not present

## 2021-08-17 DIAGNOSIS — E7800 Pure hypercholesterolemia, unspecified: Secondary | ICD-10-CM | POA: Diagnosis not present

## 2021-08-17 DIAGNOSIS — E210 Primary hyperparathyroidism: Secondary | ICD-10-CM | POA: Diagnosis not present

## 2021-08-17 DIAGNOSIS — E1142 Type 2 diabetes mellitus with diabetic polyneuropathy: Secondary | ICD-10-CM | POA: Diagnosis not present

## 2021-08-17 DIAGNOSIS — E1169 Type 2 diabetes mellitus with other specified complication: Secondary | ICD-10-CM | POA: Diagnosis not present

## 2021-08-25 DIAGNOSIS — E1142 Type 2 diabetes mellitus with diabetic polyneuropathy: Secondary | ICD-10-CM | POA: Diagnosis not present

## 2021-08-25 DIAGNOSIS — E1169 Type 2 diabetes mellitus with other specified complication: Secondary | ICD-10-CM | POA: Diagnosis not present

## 2021-08-25 DIAGNOSIS — E210 Primary hyperparathyroidism: Secondary | ICD-10-CM | POA: Diagnosis not present

## 2021-08-25 DIAGNOSIS — I10 Essential (primary) hypertension: Secondary | ICD-10-CM | POA: Diagnosis not present

## 2021-08-25 DIAGNOSIS — E1136 Type 2 diabetes mellitus with diabetic cataract: Secondary | ICD-10-CM | POA: Diagnosis not present

## 2021-08-25 DIAGNOSIS — E7800 Pure hypercholesterolemia, unspecified: Secondary | ICD-10-CM | POA: Diagnosis not present

## 2021-08-30 DIAGNOSIS — Z08 Encounter for follow-up examination after completed treatment for malignant neoplasm: Secondary | ICD-10-CM | POA: Diagnosis not present

## 2021-08-30 DIAGNOSIS — Z1283 Encounter for screening for malignant neoplasm of skin: Secondary | ICD-10-CM | POA: Diagnosis not present

## 2021-08-30 DIAGNOSIS — Z85820 Personal history of malignant melanoma of skin: Secondary | ICD-10-CM | POA: Diagnosis not present

## 2021-08-30 DIAGNOSIS — D225 Melanocytic nevi of trunk: Secondary | ICD-10-CM | POA: Diagnosis not present

## 2021-08-30 DIAGNOSIS — L570 Actinic keratosis: Secondary | ICD-10-CM | POA: Diagnosis not present

## 2021-09-01 DIAGNOSIS — E7800 Pure hypercholesterolemia, unspecified: Secondary | ICD-10-CM | POA: Diagnosis not present

## 2021-09-01 DIAGNOSIS — E1169 Type 2 diabetes mellitus with other specified complication: Secondary | ICD-10-CM | POA: Diagnosis not present

## 2021-09-01 DIAGNOSIS — E210 Primary hyperparathyroidism: Secondary | ICD-10-CM | POA: Diagnosis not present

## 2021-09-01 DIAGNOSIS — I10 Essential (primary) hypertension: Secondary | ICD-10-CM | POA: Diagnosis not present

## 2021-09-01 DIAGNOSIS — E1136 Type 2 diabetes mellitus with diabetic cataract: Secondary | ICD-10-CM | POA: Diagnosis not present

## 2021-09-01 DIAGNOSIS — E1142 Type 2 diabetes mellitus with diabetic polyneuropathy: Secondary | ICD-10-CM | POA: Diagnosis not present

## 2021-09-14 DIAGNOSIS — E1142 Type 2 diabetes mellitus with diabetic polyneuropathy: Secondary | ICD-10-CM | POA: Diagnosis not present

## 2021-09-14 DIAGNOSIS — E1169 Type 2 diabetes mellitus with other specified complication: Secondary | ICD-10-CM | POA: Diagnosis not present

## 2021-09-14 DIAGNOSIS — E7800 Pure hypercholesterolemia, unspecified: Secondary | ICD-10-CM | POA: Diagnosis not present

## 2021-09-14 DIAGNOSIS — E210 Primary hyperparathyroidism: Secondary | ICD-10-CM | POA: Diagnosis not present

## 2021-09-14 DIAGNOSIS — I10 Essential (primary) hypertension: Secondary | ICD-10-CM | POA: Diagnosis not present

## 2021-09-14 DIAGNOSIS — E1136 Type 2 diabetes mellitus with diabetic cataract: Secondary | ICD-10-CM | POA: Diagnosis not present

## 2021-11-29 DIAGNOSIS — K5900 Constipation, unspecified: Secondary | ICD-10-CM | POA: Diagnosis not present

## 2021-11-29 DIAGNOSIS — I10 Essential (primary) hypertension: Secondary | ICD-10-CM | POA: Diagnosis not present

## 2021-11-29 DIAGNOSIS — G629 Polyneuropathy, unspecified: Secondary | ICD-10-CM | POA: Diagnosis not present

## 2021-11-29 DIAGNOSIS — E1169 Type 2 diabetes mellitus with other specified complication: Secondary | ICD-10-CM | POA: Diagnosis not present

## 2021-11-29 DIAGNOSIS — G912 (Idiopathic) normal pressure hydrocephalus: Secondary | ICD-10-CM | POA: Diagnosis not present

## 2021-11-29 DIAGNOSIS — E039 Hypothyroidism, unspecified: Secondary | ICD-10-CM | POA: Diagnosis not present

## 2021-11-29 DIAGNOSIS — K649 Unspecified hemorrhoids: Secondary | ICD-10-CM | POA: Diagnosis not present

## 2021-11-29 DIAGNOSIS — E7800 Pure hypercholesterolemia, unspecified: Secondary | ICD-10-CM | POA: Diagnosis not present

## 2021-11-29 DIAGNOSIS — R269 Unspecified abnormalities of gait and mobility: Secondary | ICD-10-CM | POA: Diagnosis not present

## 2021-12-13 ENCOUNTER — Encounter: Payer: Self-pay | Admitting: Neurology

## 2021-12-13 ENCOUNTER — Ambulatory Visit: Payer: Medicare Other | Admitting: Neurology

## 2021-12-13 VITALS — BP 143/74 | HR 88

## 2021-12-13 DIAGNOSIS — G609 Hereditary and idiopathic neuropathy, unspecified: Secondary | ICD-10-CM

## 2021-12-13 DIAGNOSIS — G9389 Other specified disorders of brain: Secondary | ICD-10-CM | POA: Diagnosis not present

## 2021-12-13 DIAGNOSIS — R269 Unspecified abnormalities of gait and mobility: Secondary | ICD-10-CM

## 2021-12-13 NOTE — Progress Notes (Signed)
? ? ?Patient: Margaret Francis ?Date of Birth: Jun 15, 1930 ? ?Reason for Visit: Follow up ?History from: Patient, Margaret Francis: caregiver  ?Primary Neurologist: Dr. Julius Bowels. Margaret Francis  ? ?ASSESSMENT AND PLAN ?88 y.o. year old female  ?1.  Gait disturbance ?2.  Peripheral neuropathy ?3.  Ventriculomegaly  ? ?-order [facility] PT/OT for gait, balance, strengthening exercises, Dr. Jannifer Franklin following gait overtime ?-Follow-up in 1 year or sooner if needed, we discussed returning her PRN in future ? ?HISTORY OF PRESENT ILLNESS: ?Today 12/13/21 ?Margaret Francis is here today for follow-up. Has caregivers with her in the morning, afternoon. Lives alone. Last Wednesday had a fall, got a skin tear to left shin. Uses walker, walls to hold on. Generally has a fall once monthly. Has wheelchair when she goes out. Would like to do home health PT again. Had great benefit in past. Takes Myrbetriq for bladder, helps but still gets up during the night to urinate. There have been no major changes in her condition.  ? ?HISTORY  ?05/30/2021 Dr. Jannifer Franklin: Margaret Francis is an 88 year old left-handed white female with a history of a prominent gait disorder.  The patient has been at risk for falls, she has not fallen since last seen.  She underwent physical therapy which ended in July 2022.  She uses a walker for ambulation.  She stays alone at night, she has a bedside commode and does not have to walk to go to the bathroom.  She is on Myrbetriq 50 mg which seems to help her urinary incontinence some. She did fall prior to her last visit, she fractured her right elbow but this healed up on its own, she is not having any pain or discomfort with this.  She comes to this office for further evaluation. ? ?REVIEW OF SYSTEMS: Out of a complete 14 system review of symptoms, the patient complains only of the following symptoms, and all other reviewed systems are negative. ? ?See HPI ? ?ALLERGIES: ?Allergies  ?Allergen Reactions  ? Demerol Nausea And Vomiting  ? Morphine And  Related Nausea And Vomiting  ? ? ?HOME MEDICATIONS: ?Outpatient Medications Prior to Visit  ?Medication Sig Dispense Refill  ? aspirin 81 MG tablet Take 81 mg by mouth daily.    ? levothyroxine (SYNTHROID, LEVOTHROID) 50 MCG tablet Take 50 mcg by mouth daily.    ? losartan (COZAAR) 50 MG tablet Take 50 mg by mouth daily.    ? mirabegron ER (MYRBETRIQ) 50 MG TB24 tablet Take 1 tablet (50 mg total) by mouth daily. 90 tablet 3  ? Omeprazole (PRILOSEC PO) Take by mouth.    ? sertraline (ZOLOFT) 50 MG tablet 1 tablet    ? valsartan (DIOVAN) 320 MG tablet Take 320 mg by mouth daily.    ? ?No facility-administered medications prior to visit.  ? ? ?PAST MEDICAL HISTORY: ?Past Medical History:  ?Diagnosis Date  ? Abnormality of gait 03/20/2013  ? Bilateral foot-drop 07/28/2020  ? Cerebral ventriculomegaly   ? Gastroesophageal reflux disease   ? History of stroke   ? Right inferior cerebellum  ? Hypertension   ? Osteopenia   ? Peripheral neuropathy 11/29/2020  ? Stroke [facility])   ? Thyroid disease   ? Urinary frequency 04/19/2011  ? Urinary incontinence   ? ? ?PAST SURGICAL HISTORY: ?Past Surgical History:  ?Procedure Laterality Date  ? ABDOMINAL HYSTERECTOMY    ? APPENDECTOMY    ? BACK SURGERY    ? x2  ? BLADDER SURGERY  2010  ? CHOLECYSTECTOMY    ?  ESOPHAGUS SURGERY    ? tumor removal.  ? TONSILLECTOMY    ? at age32  ? URETHRAL SLING  2010  ? ? ?FAMILY HISTORY: ?Family History  ?Problem Relation Age of Onset  ? Diabetes Mother   ? Hypertension Mother   ? Arthritis Father   ?     died at age 50  ? Breast cancer Sister 28  ?     mastectomy  ? ? ?SOCIAL HISTORY: ?Social History  ? ?Socioeconomic History  ? Marital status: Widowed  ?  Spouse name: Not on file  ? Number of children: Not on file  ? Years of education: Not on file  ? Highest education level: Not on file  ?Occupational History  ? Not on file  ?Tobacco Use  ? Smoking status: Never  ? Smokeless tobacco: Never  ? Tobacco comments:  ?  while in college  ?Substance and Sexual  Activity  ? Alcohol use: No  ? Drug use: No  ? Sexual activity: Not on file  ?Other Topics Concern  ? Not on file  ?Social History Narrative  ? Lives alone  ? Left Handed  ? Drinks no caffeine  ? ?Social Determinants of Health  ? ?Financial Resource Strain: Not on file  ?Food Insecurity: Not on file  ?Transportation Needs: Not on file  ?Physical Activity: Not on file  ?Stress: Not on file  ?Social Connections: Not on file  ?Intimate Partner Violence: Not on file  ? ?PHYSICAL EXAM ? ?Vitals:  ? 12/13/21 1054  ?BP: (!) 143/74  ?Pulse: 88  ? ?There is no height or weight on file to calculate BMI. ? ?Generalized: Well developed, in no acute distress  ?Neurological examination  ?Mentation: Alert oriented to time, place, history taking. Follows all commands speech and language fluent ?Cranial nerve II-XII: Pupils were equal round reactive to light. Extraocular movements were full, visual field were full on confrontational test. Facial sensation and strength were normal. Head turning and shoulder shrug were normal and symmetric. ?Motor: The motor testing reveals 5 over 5 strength of all 4 extremities. Good symmetric motor tone is noted throughout.  ?Sensory: decreased sensation to soft touch to knee level bilaterally   ?Coordination: Cerebellar testing reveals good finger-nose-finger and heel-to-shin bilaterally.  ?Gait and station: In wheelchair, Has to push off from seated to stand, needs assistance, gait is wide based, bilateral foot drop  ?Reflexes: Deep tendon reflexes are symmetric  ? ?DIAGNOSTIC DATA (LABS, IMAGING, TESTING) ?- I reviewed patient records, labs, notes, testing and imaging myself where available. ? ?Lab Results  ?Component Value Date  ? WBC 6.5 02/28/2019  ? HGB 14.3 02/28/2019  ? HCT 41.1 02/28/2019  ? MCV 88.4 02/28/2019  ? PLT 231 02/28/2019  ? ?   ?Component Value Date/Time  ? NA 135 02/28/2019 1151  ? K 2.8 (L) 02/28/2019 1151  ? CL 104 02/28/2019 1151  ? CO2 21 (L) 02/28/2019 1151  ? GLUCOSE 117  (H) 02/28/2019 1151  ? BUN 19 02/28/2019 1151  ? CREATININE 0.83 02/28/2019 1151  ? CALCIUM 10.3 02/28/2019 1151  ? PROT 6.8 07/28/2020 1002  ? ALBUMIN 3.4 (L) 02/28/2019 1151  ? AST 36 02/28/2019 1151  ? ALT 41 02/28/2019 1151  ? ALKPHOS 88 02/28/2019 1151  ? BILITOT 0.4 02/28/2019 1151  ? GFRNONAA >60 02/28/2019 1151  ? GFRAA >60 02/28/2019 1151  ? ?No results found for: CHOL, HDL, LDLCALC, LDLDIRECT, TRIG, CHOLHDL ?No results found for: HGBA1C ?Lab Results  ?Component Value  Date  ? VITAMINB12 437 07/28/2020  ? ?No results found for: TSH ? ?Butler Denmark, AGNP-C, DNP 12/13/2021, 12:29 PM ?[facility] ?[address], [address] ?[place], [place] [address] ?[phone] ? ? ?

## 2021-12-13 NOTE — Patient Instructions (Signed)
I will order home health PT and OT ? ?

## 2021-12-14 ENCOUNTER — Telehealth: Payer: Self-pay | Admitting: Neurology

## 2021-12-14 NOTE — Telephone Encounter (Signed)
Sent a message to [place] with [facility]formerly [place] to see if she is able to take the patient.  ?

## 2021-12-14 NOTE — Telephone Encounter (Signed)
Margaret Francis sent me a message stating: "We should be able to start her most likely weekend " ?

## 2021-12-14 NOTE — Telephone Encounter (Signed)
[place] is not able to take the patient. ? ?I sent a message  to [facility] with [facility]  ?

## 2021-12-19 NOTE — Telephone Encounter (Signed)
Marjory Lies sent me a message stating:  ? ?"Wanted to reach out to you and give you an update: Patient Margaret Francis May 08, 2030 - Caregiver reported to [place] as we were trying to get her scheduled she would be receiving services from another agency. Other agency name not given." ? ?

## 2021-12-23 ENCOUNTER — Telehealth: Payer: Self-pay | Admitting: Neurology

## 2021-12-23 NOTE — Telephone Encounter (Signed)
Pt care giver Kingsley Spittle (on [place])  called stating she has not heard from PT and OT that was referred to by [facility] office.  ?States PCP has also referred PT but pt would like to use PT and OT referred by Butler Denmark.  ?Needing clarification on what company is being used and who they need to speak to about scheduling PT and OT.  ?

## 2021-12-26 NOTE — Telephone Encounter (Signed)
I spoke with Nira Conn the caregiver and she informed me that she had previously had used [facility]. I sent a message to [facility] who is the rep for [facility]  to see if they will be able to start the patient back up again.  ?

## 2021-12-26 NOTE — Telephone Encounter (Signed)
I spoke with Nira Conn the caregiver and she informed me that she had previously had used [facility]. I sent a message to [facility] who is the rep for [facility]  to see if they will be able to start the patient back up again.  ?

## 2021-12-28 NOTE — Telephone Encounter (Signed)
[name] the caregiver called me informing me that they have not heard anything from [facility] yet and that they can't keep waiting. I informed her that I will send the order back to [facility] with [place]. I sent a message to Marjory Lies to contact the patient.  ?

## 2021-12-28 NOTE — Telephone Encounter (Signed)
[name] the caregiver called me informing me that they have not heard anything from [facility] yet and that they can't keep waiting. I informed her that I will send the order back to [facility] with [place]. I sent a message to Marjory Lies to contact the patient.  ?

## 2022-01-02 ENCOUNTER — Emergency Department: Payer: Medicare Other

## 2022-01-02 ENCOUNTER — Other Ambulatory Visit: Payer: Self-pay

## 2022-01-02 ENCOUNTER — Inpatient Hospital Stay
Admission: EM | Admit: 2022-01-02 | Discharge: 2022-01-05 | DRG: 872 | Disposition: A | Discharge status: Skilled Nursing Facility | Payer: Medicare Other | Attending: Internal Medicine | Admitting: Internal Medicine

## 2022-01-02 DIAGNOSIS — Z888 Allergy status to other drugs, medicaments and biological substances status: Secondary | ICD-10-CM | POA: Diagnosis not present

## 2022-01-02 DIAGNOSIS — E871 Hypo-osmolality and hyponatremia: Secondary | ICD-10-CM | POA: Diagnosis present

## 2022-01-02 DIAGNOSIS — I959 Hypotension, unspecified: Secondary | ICD-10-CM | POA: Diagnosis present

## 2022-01-02 DIAGNOSIS — R7401 Elevation of levels of liver transaminase levels: Secondary | ICD-10-CM | POA: Diagnosis not present

## 2022-01-02 DIAGNOSIS — I77810 Thoracic aortic ectasia: Secondary | ICD-10-CM | POA: Diagnosis not present

## 2022-01-02 DIAGNOSIS — R000 Tachycardia, unspecified: Secondary | ICD-10-CM | POA: Diagnosis not present

## 2022-01-02 DIAGNOSIS — E861 Hypovolemia: Secondary | ICD-10-CM | POA: Diagnosis present

## 2022-01-02 DIAGNOSIS — E039 Hypothyroidism, unspecified: Secondary | ICD-10-CM | POA: Diagnosis present

## 2022-01-02 DIAGNOSIS — G629 Polyneuropathy, unspecified: Secondary | ICD-10-CM | POA: Diagnosis present

## 2022-01-02 DIAGNOSIS — Z8249 Family history of ischemic heart disease and other diseases of the circulatory system: Secondary | ICD-10-CM

## 2022-01-02 DIAGNOSIS — I10 Essential (primary) hypertension: Secondary | ICD-10-CM | POA: Diagnosis present

## 2022-01-02 DIAGNOSIS — Z6829 Body mass index (BMI) 29.0-29.9, adult: Secondary | ICD-10-CM | POA: Diagnosis not present

## 2022-01-02 DIAGNOSIS — S299XXA Unspecified injury of thorax, initial encounter: Secondary | ICD-10-CM | POA: Diagnosis not present

## 2022-01-02 DIAGNOSIS — K529 Noninfective gastroenteritis and colitis, unspecified: Secondary | ICD-10-CM | POA: Diagnosis not present

## 2022-01-02 DIAGNOSIS — Z803 Family history of malignant neoplasm of breast: Secondary | ICD-10-CM

## 2022-01-02 DIAGNOSIS — A080 Rotaviral enteritis: Secondary | ICD-10-CM | POA: Diagnosis present

## 2022-01-02 DIAGNOSIS — M6281 Muscle weakness (generalized): Secondary | ICD-10-CM | POA: Diagnosis not present

## 2022-01-02 DIAGNOSIS — R0689 Other abnormalities of breathing: Secondary | ICD-10-CM | POA: Diagnosis not present

## 2022-01-02 DIAGNOSIS — Z8673 Personal history of transient ischemic attack (TIA), and cerebral infarction without residual deficits: Secondary | ICD-10-CM | POA: Diagnosis not present

## 2022-01-02 DIAGNOSIS — Z79899 Other long term (current) drug therapy: Secondary | ICD-10-CM

## 2022-01-02 DIAGNOSIS — Z743 Need for continuous supervision: Secondary | ICD-10-CM | POA: Diagnosis not present

## 2022-01-02 DIAGNOSIS — Z7982 Long term (current) use of aspirin: Secondary | ICD-10-CM

## 2022-01-02 DIAGNOSIS — Z7401 Bed confinement status: Secondary | ICD-10-CM | POA: Diagnosis not present

## 2022-01-02 DIAGNOSIS — M25552 Pain in left hip: Secondary | ICD-10-CM | POA: Diagnosis not present

## 2022-01-02 DIAGNOSIS — G9389 Other specified disorders of brain: Secondary | ICD-10-CM | POA: Diagnosis present

## 2022-01-02 DIAGNOSIS — A419 Sepsis, unspecified organism: Secondary | ICD-10-CM | POA: Diagnosis present

## 2022-01-02 DIAGNOSIS — R160 Hepatomegaly, not elsewhere classified: Secondary | ICD-10-CM | POA: Diagnosis not present

## 2022-01-02 DIAGNOSIS — K5730 Diverticulosis of large intestine without perforation or abscess without bleeding: Secondary | ICD-10-CM | POA: Diagnosis present

## 2022-01-02 DIAGNOSIS — Z66 Do not resuscitate: Secondary | ICD-10-CM | POA: Diagnosis not present

## 2022-01-02 DIAGNOSIS — Z833 Family history of diabetes mellitus: Secondary | ICD-10-CM

## 2022-01-02 DIAGNOSIS — E663 Overweight: Secondary | ICD-10-CM | POA: Diagnosis present

## 2022-01-02 DIAGNOSIS — Z79890 Hormone replacement therapy (postmenopausal): Secondary | ICD-10-CM | POA: Diagnosis not present

## 2022-01-02 DIAGNOSIS — Z8261 Family history of arthritis: Secondary | ICD-10-CM

## 2022-01-02 DIAGNOSIS — R197 Diarrhea, unspecified: Secondary | ICD-10-CM | POA: Diagnosis not present

## 2022-01-02 DIAGNOSIS — Z885 Allergy status to narcotic agent status: Secondary | ICD-10-CM

## 2022-01-02 DIAGNOSIS — M8580 Other specified disorders of bone density and structure, unspecified site: Secondary | ICD-10-CM | POA: Diagnosis not present

## 2022-01-02 DIAGNOSIS — R5381 Other malaise: Secondary | ICD-10-CM | POA: Diagnosis not present

## 2022-01-02 DIAGNOSIS — R278 Other lack of coordination: Secondary | ICD-10-CM | POA: Diagnosis not present

## 2022-01-02 DIAGNOSIS — Z20822 Contact with and (suspected) exposure to covid-19: Secondary | ICD-10-CM | POA: Diagnosis not present

## 2022-01-02 DIAGNOSIS — A4189 Other specified sepsis: Principal | ICD-10-CM | POA: Diagnosis present

## 2022-01-02 DIAGNOSIS — R0902 Hypoxemia: Secondary | ICD-10-CM | POA: Diagnosis present

## 2022-01-02 DIAGNOSIS — R6889 Other general symptoms and signs: Secondary | ICD-10-CM | POA: Diagnosis not present

## 2022-01-02 DIAGNOSIS — J984 Other disorders of lung: Secondary | ICD-10-CM | POA: Diagnosis not present

## 2022-01-02 DIAGNOSIS — R2689 Other abnormalities of gait and mobility: Secondary | ICD-10-CM | POA: Diagnosis not present

## 2022-01-02 DIAGNOSIS — R296 Repeated falls: Secondary | ICD-10-CM | POA: Diagnosis present

## 2022-01-02 DIAGNOSIS — R1111 Vomiting without nausea: Secondary | ICD-10-CM | POA: Diagnosis not present

## 2022-01-02 DIAGNOSIS — R29898 Other symptoms and signs involving the musculoskeletal system: Secondary | ICD-10-CM | POA: Diagnosis not present

## 2022-01-02 DIAGNOSIS — E079 Disorder of thyroid, unspecified: Secondary | ICD-10-CM | POA: Diagnosis present

## 2022-01-02 DIAGNOSIS — R6520 Severe sepsis without septic shock: Secondary | ICD-10-CM | POA: Diagnosis not present

## 2022-01-02 LAB — CBC WITH DIFFERENTIAL/PLATELET
Abs Immature Granulocytes: 0.04 10*3/uL (ref 0.00–0.07)
Basophils Absolute: 0 10*3/uL (ref 0.0–0.1)
Basophils Relative: 0 %
Eosinophils Absolute: 0 10*3/uL (ref 0.0–0.5)
Eosinophils Relative: 0 %
HCT: 38.2 % (ref 36.0–46.0)
Hemoglobin: 12.6 g/dL (ref 12.0–15.0)
Immature Granulocytes: 0 %
Lymphocytes Relative: 3 %
Lymphs Abs: 0.3 10*3/uL — ABNORMAL LOW (ref 0.7–4.0)
MCH: 31.1 pg (ref 26.0–34.0)
MCHC: 33 g/dL (ref 30.0–36.0)
MCV: 94.3 fL (ref 80.0–100.0)
Monocytes Absolute: 0.2 10*3/uL (ref 0.1–1.0)
Monocytes Relative: 2 %
Neutro Abs: 9.2 10*3/uL — ABNORMAL HIGH (ref 1.7–7.7)
Neutrophils Relative %: 95 %
Platelets: 249 10*3/uL (ref 150–400)
RBC: 4.05 MIL/uL (ref 3.87–5.11)
RDW: 12.8 % (ref 11.5–15.5)
WBC: 9.8 10*3/uL (ref 4.0–10.5)
nRBC: 0 % (ref 0.0–0.2)

## 2022-01-02 LAB — URINALYSIS, COMPLETE (UACMP) WITH MICROSCOPIC
Bilirubin Urine: NEGATIVE
Glucose, UA: NEGATIVE mg/dL
Hgb urine dipstick: NEGATIVE
Ketones, ur: NEGATIVE mg/dL
Leukocytes,Ua: NEGATIVE
Nitrite: NEGATIVE
Protein, ur: NEGATIVE mg/dL
Specific Gravity, Urine: 1.025 (ref 1.005–1.030)
pH: 5 (ref 5.0–8.0)

## 2022-01-02 LAB — C DIFFICILE QUICK SCREEN W PCR REFLEX
C Diff antigen: NEGATIVE
C Diff interpretation: NOT DETECTED
C Diff toxin: NEGATIVE

## 2022-01-02 LAB — COMPREHENSIVE METABOLIC PANEL
ALT: 48 U/L — ABNORMAL HIGH (ref 0–44)
AST: 60 U/L — ABNORMAL HIGH (ref 15–41)
Albumin: 3.6 g/dL (ref 3.5–5.0)
Alkaline Phosphatase: 139 U/L — ABNORMAL HIGH (ref 38–126)
Anion gap: 9 (ref 5–15)
BUN: 17 mg/dL (ref 8–23)
CO2: 23 mmol/L (ref 22–32)
Calcium: 9.7 mg/dL (ref 8.9–10.3)
Chloride: 97 mmol/L — ABNORMAL LOW (ref 98–111)
Creatinine, Ser: 0.75 mg/dL (ref 0.44–1.00)
GFR, Estimated: >60 mL/min (ref >60)
Glucose, Bld: 157 mg/dL — ABNORMAL HIGH (ref 70–99)
Potassium: 3.2 mmol/L — ABNORMAL LOW (ref 3.5–5.1)
Sodium: 129 mmol/L — ABNORMAL LOW (ref 135–145)
Total Bilirubin: 0.9 mg/dL (ref 0.3–1.2)
Total Protein: 6.7 g/dL (ref 6.5–8.1)

## 2022-01-02 LAB — GASTROINTESTINAL PANEL BY PCR, STOOL (REPLACES STOOL CULTURE)

## 2022-01-02 LAB — PROCALCITONIN: Procalcitonin: 0.32 ng/mL

## 2022-01-02 LAB — LIPASE, BLOOD: Lipase: 25 U/L (ref 11–51)

## 2022-01-02 LAB — RESP PANEL BY RT-PCR (FLU A&B, COVID) ARPGX2
Influenza A by PCR: NEGATIVE
Influenza B by PCR: NEGATIVE
SARS Coronavirus 2 by RT PCR: NEGATIVE

## 2022-01-02 LAB — LACTIC ACID, PLASMA: Lactic Acid, Venous: 1.5 mmol/L (ref 0.5–1.9)

## 2022-01-02 LAB — PROTIME-INR
INR: 1 (ref 0.8–1.2)
Prothrombin Time: 13.2 seconds (ref 11.4–15.2)

## 2022-01-02 LAB — APTT: aPTT: 36 seconds (ref 24–36)

## 2022-01-02 MED ORDER — ENOXAPARIN SODIUM 40 MG/0.4ML IJ SOSY
40.0000 mg | PREFILLED_SYRINGE | INTRAMUSCULAR | Status: DC
  Administered 2022-01-03 – 2022-01-05 (×3): 40 mg via SUBCUTANEOUS
  Filled 2022-01-02 (×3): qty 0.4

## 2022-01-02 MED ORDER — LACTATED RINGERS IV SOLN: INTRAVENOUS | Status: DC

## 2022-01-02 MED ORDER — ACETAMINOPHEN 325 MG PO TABS
650.0000 mg | ORAL_TABLET | Freq: Four times a day (QID) | ORAL | Status: DC | PRN
  Administered 2022-01-03: 650 mg via ORAL
  Filled 2022-01-02: qty 2

## 2022-01-02 MED ORDER — ACETAMINOPHEN 500 MG PO TABS
1000.0000 mg | ORAL_TABLET | Freq: Once | ORAL | Status: AC
  Administered 2022-01-02: 1000 mg via ORAL
  Filled 2022-01-02: qty 2

## 2022-01-02 MED ORDER — SODIUM CHLORIDE 0.9 % IV BOLUS (SEPSIS)
1000.0000 mL | Freq: Once | INTRAVENOUS | Status: AC
  Administered 2022-01-02: 1000 mL via INTRAVENOUS

## 2022-01-02 MED ORDER — ACETAMINOPHEN 650 MG RE SUPP: 650.0000 mg | Freq: Four times a day (QID) | RECTAL | Status: DC | PRN

## 2022-01-02 MED ORDER — SODIUM CHLORIDE 0.9 % IV SOLN
2.0000 g | Freq: Once | INTRAVENOUS | Status: AC
  Administered 2022-01-02: 2 g via INTRAVENOUS
  Filled 2022-01-02: qty 12.5

## 2022-01-02 MED ORDER — METRONIDAZOLE 500 MG/100ML IV SOLN
500.0000 mg | Freq: Once | INTRAVENOUS | Status: AC
  Administered 2022-01-02: 500 mg via INTRAVENOUS
  Filled 2022-01-02: qty 100

## 2022-01-02 MED ORDER — VANCOMYCIN HCL 1500 MG/300ML IV SOLN
1500.0000 mg | Freq: Once | INTRAVENOUS | Status: AC
  Administered 2022-01-02: 1500 mg via INTRAVENOUS
  Filled 2022-01-02: qty 300

## 2022-01-02 NOTE — ED Triage Notes (Signed)
Pt BIB EMS from home after several days of n/v/d. Per EMS, pts caregiver called out and stated pt is not getting any better. Pt has had 5-6 days of n/v/d. Per pt, she has fallen couple of times in the past week. Pt does have bruising on left hip area. Pt endorses fevers.  ?

## 2022-01-02 NOTE — Assessment & Plan Note (Addendum)
Secondary to rotavirus enteritis ?Sepsis criteria includes Tmax 102.8, tachycardia to 103 tachypnea of 24,   sepsis has since resolved. ? ?

## 2022-01-02 NOTE — Assessment & Plan Note (Signed)
Continue aspirin and statin. 

## 2022-01-02 NOTE — ED Provider Notes (Signed)
? ?[facility] ?Provider Note ? ? ? Event Date/Time  ? First MD Initiated Contact with Patient 01/02/22 1723   ?  (approximate) ? ? ?History  ? ?Diarrhea ? ? ?HPI ? ?Margaret Francis is a 88 y.o. female with past medical history of GERD, CVA, ventriculomegaly, gait disorder who presents with nausea vomiting and diarrhea.  Patient says she has been feeling unwell for several days.  Has had multiple episodes of nonbloody nonbilious emesis nausea and multiple episodes of diarrhea.  She is not sure if there is been blood in it.  Feels very weak says she has had multiple falls but cannot really tell me the nature of them what part of her body she is falling onto and or if she is hit her head.  Says that her caregiver is sick with "a virus" she is not sure there is been any COVID testing.  She denies cough shortness of breath or chest pain.  Denies headache. ?  ? ?Past Medical History:  ?Diagnosis Date  ? Abnormality of gait 03/20/2013  ? Bilateral foot-drop 07/28/2020  ? Cerebral ventriculomegaly   ? Gastroesophageal reflux disease   ? History of stroke   ? Right inferior cerebellum  ? Hypertension   ? Osteopenia   ? Peripheral neuropathy 11/29/2020  ? Stroke [facility])   ? Thyroid disease   ? Urinary frequency 04/19/2011  ? Urinary incontinence   ? ? ?Patient Active Problem List  ? Diagnosis Date Noted  ? Peripheral neuropathy 11/29/2020  ? Cerebral ventriculomegaly 11/29/2020  ? Bilateral foot-drop 07/28/2020  ? Abnormality of gait 03/20/2013  ? Urinary frequency 04/19/2011  ? ? ? ?Physical Exam  ?Triage Vital Signs: ?ED Triage Vitals  ?Enc Vitals Group  ?   BP 01/02/22 1723 (!) 145/68  ?   Pulse Rate 01/02/22 1723 (!) 103  ?   Resp 01/02/22 1723 (!) 24  ?   Temp 01/02/22 1723 (!) 102.8 ?F (39.3 ?C)  ?   Temp Source 01/02/22 1723 Rectal  ?   SpO2 01/02/22 1723 93 %  ?   Weight --   ?   Height --   ?   Head Circumference --   ?   Peak Flow --   ?   Pain Score 01/02/22 1721 7  ?   Pain Loc --   ?   Pain Edu?  --   ?   Excl. in [place]? --   ? ? ?Most recent vital signs: ?Vitals:  ? 01/02/22 1723 01/02/22 1900  ?BP: (!) 145/68 134/65  ?Pulse: (!) 103 100  ?Resp: (!) 24 (!) 21  ?Temp: (!) 102.8 ?F (39.3 ?C)   ?SpO2: 93% 97%  ? ? ? ?General: Awake, no distress. ?CV:  Good peripheral perfusion. No edema ?Resp:  Normal effort. No increased wob ?Abd:  Mild tenderness to palpation throughout some distention, no guarding, soft ?Neuro:             Awake, Alert, Oriented x 3  ?Other:  Ecchymosis over the left lateral thigh and hip, no tenderness to palpation, able to range the left leg without pain ? ?No meningismus ? ?Dry mucous membranes ? ? ?ED Results / Procedures / Treatments  ?Labs ?(all labs ordered are listed, but only abnormal results are displayed) ?Labs Reviewed  ?COMPREHENSIVE METABOLIC PANEL - Abnormal; Notable for the following components:  ?    Result Value  ? Sodium 129 (*)   ? Potassium 3.2 (*)   ?  Chloride 97 (*)   ? Glucose, Bld 157 (*)   ? AST 60 (*)   ? ALT 48 (*)   ? Alkaline Phosphatase 139 (*)   ? All other components within normal limits  ?CBC WITH DIFFERENTIAL/PLATELET - Abnormal; Notable for the following components:  ? Neutro Abs 9.2 (*)   ? Lymphs Abs 0.3 (*)   ? All other components within normal limits  ?URINALYSIS, COMPLETE (UACMP) WITH MICROSCOPIC - Abnormal; Notable for the following components:  ? Color, Urine YELLOW (*)   ? APPearance HAZY (*)   ? Bacteria, UA MANY (*)   ? All other components within normal limits  ?RESP PANEL BY RT-PCR (FLU A&B, COVID) [id number]  ?CULTURE, BLOOD (ROUTINE X 2)  ?CULTURE, BLOOD (ROUTINE X 2)  ?URINE CULTURE  ?GASTROINTESTINAL PANEL BY PCR, STOOL (REPLACES STOOL CULTURE)  ?C DIFFICILE QUICK SCREEN W PCR REFLEX    ?LACTIC ACID, PLASMA  ?PROTIME-INR  ?APTT  ?LIPASE, BLOOD  ?PROCALCITONIN  ? ? ? ?EKG ? ?EKG interpreted by myself shows normal sinus rhythm with normal axis PVCs nonspecific T wave flattening ? ? ?RADIOLOGY ?I reviewed the CT abdomen pelvis without contrast  which shows colitis ? ? ?PROCEDURES: ? ?Critical Care performed: Yes, see critical care procedure note(s) ? ?.1-3 Lead EKG Interpretation ?Performed by: Rada Hay, MD ?Authorized by: Rada Hay, MD  ? ?  Interpretation: abnormal   ?  ECG rate assessment: tachycardic   ?  Rhythm: sinus tachycardia   ?  Ectopy: none   ?  Conduction: normal   ? ?The patient is on the cardiac monitor to evaluate for evidence of arrhythmia and/or significant heart rate changes. ? ? ?MEDICATIONS ORDERED IN ED: ?Medications  ?lactated ringers infusion ( Intravenous New Bag/Given 01/02/22 2008)  ?vancomycin (VANCOREADY) IVPB 1500 mg/300 mL (1,500 mg Intravenous New Bag/Given 01/02/22 2012)  ?metroNIDAZOLE (FLAGYL) IVPB 500 mg (500 mg Intravenous New Bag/Given 01/02/22 2010)  ?sodium chloride 0.9 % bolus 1,000 mL (0 mLs Intravenous Stopped 01/02/22 2000)  ?acetaminophen (TYLENOL) tablet 1,000 mg (1,000 mg Oral Given 01/02/22 1758)  ?ceFEPIme (MAXIPIME) 2 g in sodium chloride 0.9 % 100 mL IVPB (0 g Intravenous Stopped 01/02/22 2001)  ? ? ? ?IMPRESSION / MDM / ASSESSMENT AND PLAN / ED COURSE  ?I reviewed the triage vital signs and the nursing notes. ?             ?               ? ?Differential diagnosis includes, but is not limited to, sepsis secondary to UTI, pneumonia, viral respiratory illness, COVID, bacterial or viral diarrhea illness ? ?The patient is a 88 year old female presents with nausea vomiting diarrhea from home.  She has a caregiver who apparently has been ill as well.  Patient is febrile to 102.8 mildly tachycardic and tachypneic blood pressures within normal limits pulse ox is also borderline at 93%.  She does not appear toxic but does appear somewhat chronically ill.  Her abdomen is mildly tender throughout but is overall benign she has very dry mucous membranes no meningismus she is oriented x3 but does repeatedly asked the same questions and does not provide much of a history.  I am concerned for sepsis based on  her vital signs.  In addition to evaluating for new bacterial pneumonia or UTI will send viral testing for COVID and flu and GI PCR given her prominent diarrhea.  We will fluid resuscitate and also obtain a CT  of her head and x-ray of the left hip given the frequent falls. ? ?  ?His labs are overall reassuring she has increased neutrophil count but no elevated white blood cell count, alk phos AST and ALT are mildly elevated lactate is normal.  Mildly hypokalemic and hyponatremic.  Renal function is okay.  UA does not suggest infection.  COVID and influenza testing are negative.  Pro-Cal is 0.32.  CT abdomen pelvis shows stool in the colon and fitting with nonspecific colitis.  There is increased density at the site of the hematoma on her left hip but no evidence of fracture.  Chest x-ray shows some thickening in the right lung border which could reflect an early pneumonia.  Patient does have significant amount of liquid stool.  GI PCR and C. difficile currently pending.  Patient was covered broadly with Vanco cefepime Flagyl and given fluids.  Will admit to the hospitalist service.   ? ?FINAL CLINICAL IMPRESSION(S) / ED DIAGNOSES  ? ?Final diagnoses:  ?Diarrhea, unspecified type  ?Colitis  ? ? ? ?Rx / DC Orders  ? ?ED Discharge Orders   ? ? None  ? ?  ? ? ? ?Note:  This document was prepared using Dragon voice recognition software and may include unintentional dictation errors. ?  ?Rada Hay, MD ?01/02/22 2017 ? ?

## 2022-01-02 NOTE — Assessment & Plan Note (Addendum)
Mild.  Likely related to hypotension from sepsis.  Transaminases have started trending downward. ? ?

## 2022-01-02 NOTE — Sepsis Progress Note (Signed)
Sepsis protocol is being followed by [facility]. 

## 2022-01-02 NOTE — Assessment & Plan Note (Addendum)
Weakness ?No evidence of acute injury on x-ray hip and CT head ?--Pt felt significantly weaker than baseline due to this episode of acute GI illness with constant diarrhea PTA, and as a result, had multiple falls at home. ?--PT/OT rec SNF rehab ?

## 2022-01-02 NOTE — Assessment & Plan Note (Signed)
Continue levothyroxine 

## 2022-01-02 NOTE — Assessment & Plan Note (Addendum)
--  rectal tube placed in the ED on presentation, presumably due to severe diarrhea.  Once diarrhea improved, rectal tube removed the following day. --received vanc cefepime and Flagyl in the ED but given positive rotavirus, abx were not continued.  Symptoms slowly improving.

## 2022-01-02 NOTE — Assessment & Plan Note (Addendum)
Hypovolemic related to diarrhea.  Resolved with IVF. ?

## 2022-01-02 NOTE — Assessment & Plan Note (Addendum)
Initially systolic blood pressure soft due to sepsis, however pressure starting to trend up and home Cozaar resumed

## 2022-01-02 NOTE — H&P (Signed)
?History and Physical  ? ? ?Patient: Margaret Francis MVE:[id number] DOB: 1930-05-09 ?DOA: 01/02/2022 ?DOS: the patient was seen and examined on 01/02/2022 ?PCP: Seward Carol, MD  ?Patient coming from: Home ? ?Chief Complaint:  ?Chief Complaint  ?Patient presents with  ? Diarrhea  ? ?HPI: Margaret Francis is a 88 y.o. female with medical history significant of Hypertension, prior stroke, hypothyroidism, who presents with a several day history of nausea vomiting and diarrhea, generalized weakness and now associated with fever.  She denies abdominal pain, cough, shortness of breath or chest pain.  Vomitus is nonbloody and nonbilious.  Diarrhea is nonbloody and nonmelanotic.  Her weakness has caused multiple falls but without injury.  No affected contacts and she denies ingestion of offending foods. ?ED course and data review: On arrival temperature 102.8 with pulse 103, respirations 24 and O2 sat 93% on room air.  BP initially 145/68 dropping to 107/58 just prior to admission.  Labs with normal CBC with differential and lactic acid 1.5 CMP significant for sodium 129, potassium 3.2, glucose 157, AST/ALT 60/48 and alk phos 139.  Lipase normal at 25.  Pro-Calc 0.32.  COVID and flu negative.  Urinalysis with many bacteria but otherwise unremarkable C. difficile negative, GI panel by PCR positive for rotavirus.  EKG, personally viewed and interpreted shows sinus tachycardia at 108 with nonspecific ST-T wave changes ?CT abdomen and pelvis shows the following ?IMPRESSION: ?There is no evidence of intestinal obstruction or pneumoperitoneum. ?There is no hydronephrosis. ?  ?There is liquid stool in the lumen of colon and rectum suggesting ?possible nonspecific colitis. There is no significant wall ?thickening in colon. ?  ?There is 6.3 x 6 x 3.1 cm soft tissue density structure with ?irregular margins in the subcutaneous plane lateral to the left hip ?along with skin thickening. This may suggest contusion and ?subcutaneous  hematoma. Less likely possibility would be infectious ?or neoplastic process. There is no loculated fluid collection. ?  ?There is decrease in height of upper endplates of bodies of L1 and ?L3 vertebrae suggesting recent or old compression fractures. ?  ?Small hiatal hernia.  Enlarged liver.  Diverticulosis of colon. ?  ?Other findings as described in the body of the report. ? ?CT head showing the following:  ?IMPRESSION: ?No acute intracranial findings are seen in noncontrast CT brain. ?There is dilation of third and both lateral ventricles out of ?proportion to cortical atrophy suggesting possible normal pressure ?hydrocephalus ? ?X-ray hip nonacute,  ?chest x-ray with the following: ?IMPRESSION: ?There is subtle increased density in the lateral aspect of right mid ?lung fields which may suggest pleural thickening. Less likely ?possibility would be early pneumonia. Follow-up PA and lateral views ?of chest may be considered. ?  ?There are no signs of pulmonary edema or definite focal pulmonary ?consolidation. There is no pleural effusion or pneumothorax ?Patient started on broad-spectrum antibiotics with sodium vancomycin and cefepime, started on sepsis fluids and hospitalist consulted for admission.  ? ?Review of Systems: As mentioned in the history of present illness. All other systems reviewed and are negative. ?Past Medical History:  ?Diagnosis Date  ? Abnormality of gait 03/20/2013  ? Bilateral foot-drop 07/28/2020  ? Cerebral ventriculomegaly   ? Gastroesophageal reflux disease   ? History of stroke   ? Right inferior cerebellum  ? Hypertension   ? Osteopenia   ? Peripheral neuropathy 11/29/2020  ? Stroke [facility])   ? Thyroid disease   ? Urinary frequency 04/19/2011  ? Urinary incontinence   ? ?  Past Surgical History:  ?Procedure Laterality Date  ? ABDOMINAL HYSTERECTOMY    ? APPENDECTOMY    ? BACK SURGERY    ? x2  ? BLADDER SURGERY  2010  ? CHOLECYSTECTOMY    ? ESOPHAGUS SURGERY    ? tumor removal.  ? TONSILLECTOMY     ? at age32  ? URETHRAL SLING  2010  ? ?Social History:  reports that she has never smoked. She has never used smokeless tobacco. She reports that she does not drink alcohol and does not use drugs. ? ?Allergies  ?Allergen Reactions  ? Demerol Nausea And Vomiting  ? Morphine And Related Nausea And Vomiting  ? ? ?Family History  ?Problem Relation Age of Onset  ? Diabetes Mother   ? Hypertension Mother   ? Arthritis Father   ?     died at age 46  ? Breast cancer Sister 22  ?     mastectomy  ? ? ?Prior to Admission medications   ?Medication Sig Start Date End Date Taking? Authorizing Provider  ?aspirin 81 MG tablet Take 81 mg by mouth daily.    [provider]  ?levothyroxine (SYNTHROID, LEVOTHROID) 50 MCG tablet Take 50 mcg by mouth daily.    [provider]  ?losartan (COZAAR) 100 MG tablet Take 100 mg by mouth daily. 11/10/21   [provider]  ?losartan (COZAAR) 50 MG tablet Take 50 mg by mouth daily.    [provider]  ?mirabegron ER (MYRBETRIQ) 50 MG TB24 tablet Take 1 tablet (50 mg total) by mouth daily. 05/30/21   Kathrynn Ducking, MD  ?mirtazapine (REMERON) 15 MG tablet Take 7.5 mg by mouth daily. 12/26/21   [provider]  ?Omeprazole (PRILOSEC PO) Take by mouth.    [provider]  ?sertraline (ZOLOFT) 50 MG tablet 1 tablet 11/29/21   [provider]  ?valsartan (DIOVAN) 320 MG tablet Take 320 mg by mouth daily.    [provider]  ? ? ?Physical Exam: ?Vitals:  ? 01/02/22 1739 01/02/22 1900 01/02/22 2047 01/02/22 2307  ?BP:  134/65 (!) 107/58 (!) 101/54  ?Pulse:  100 93 75  ?Resp:  (!) _0 ?Temp:   98.9 ?F (37.2 ?C) 97.8 ?F (36.6 ?C)  ?TempSrc:   Oral   ?SpO2:  97% 97% 94%  ?Weight: 86.2 kg     ?Height: _1  (1.702 m)     ? ?Physical Exam ?Vitals and nursing note reviewed.  ?Constitutional:   ?   General: She is not in acute distress. ?HENT:  ?   Head: Normocephalic and atraumatic.  ?Cardiovascular:  ?   Rate and Rhythm: Normal  rate and regular rhythm.  ?   Heart sounds: Normal heart sounds.  ?Pulmonary:  ?   Effort: Pulmonary effort is normal.  ?   Breath sounds: Normal breath sounds.  ?Abdominal:  ?   Palpations: Abdomen is soft.  ?   Tenderness: There is no abdominal tenderness.  ?Neurological:  ?   Mental Status: Mental status is at baseline.  ? ?Results for orders placed or performed during the hospital encounter of 01/02/22 (from the past 24 hour(s))  ?Resp Panel by RT-PCR (Flu A&B, Covid) Nasopharyngeal Swab     Status: None  ? Collection Time: 01/02/22  5:33 PM  ? Specimen: Nasopharyngeal Swab; Nasopharyngeal(NP) swabs in vial transport medium  ?Result Value Ref Range  ? SARS Coronavirus 2 by RT PCR NEGATIVE NEGATIVE  ? Influenza A by PCR  NEGATIVE NEGATIVE  ? Influenza B by PCR NEGATIVE NEGATIVE  ?Lactic acid, plasma     Status: None  ? Collection Time: 01/02/22  5:33 PM  ?Result Value Ref Range  ? Lactic Acid, Venous 1.5 0.5 - 1.9 mmol/L  ?Comprehensive metabolic panel     Status: Abnormal  ? Collection Time: 01/02/22  5:33 PM  ?Result Value Ref Range  ? Sodium 129 (L) 135 - 145 mmol/L  ? Potassium 3.2 (L) 3.5 - 5.1 mmol/L  ? Chloride 97 (L) 98 - 111 mmol/L  ? CO2 23 22 - 32 mmol/L  ? Glucose, Bld 157 (H) 70 - 99 mg/dL  ? BUN 17 8 - 23 mg/dL  ? Creatinine, Ser 0.75 0.44 - 1.00 mg/dL  ? Calcium 9.7 8.9 - 10.3 mg/dL  ? Total Protein 6.7 6.5 - 8.1 g/dL  ? Albumin 3.6 3.5 - 5.0 g/dL  ? AST 60 (H) 15 - 41 U/L  ? ALT 48 (H) 0 - 44 U/L  ? Alkaline Phosphatase 139 (H) 38 - 126 U/L  ? Total Bilirubin 0.9 0.3 - 1.2 mg/dL  ? GFR, Estimated >60 >60 mL/min  ? Anion gap 9 5 - 15  ?CBC with Differential     Status: Abnormal  ? Collection Time: 01/02/22  5:33 PM  ?Result Value Ref Range  ? WBC 9.8 4.0 - 10.5 K/uL  ? RBC 4.05 3.87 - 5.11 MIL/uL  ? Hemoglobin 12.6 12.0 - 15.0 g/dL  ? HCT 38.2 36.0 - 46.0 %  ? MCV 94.3 80.0 - 100.0 fL  ? MCH 31.1 26.0 - 34.0 pg  ? MCHC 33.0 30.0 - 36.0 g/dL  ? RDW 12.8 11.5 - 15.5 %  ? Platelets 249 150 - 400 K/uL   ? nRBC 0.0 0.0 - 0.2 %  ? Neutrophils Relative % 95 %  ? Neutro Abs 9.2 (H) 1.7 - 7.7 K/uL  ? Lymphocytes Relative 3 %  ? Lymphs Abs 0.3 (L) 0.7 - 4.0 K/uL  ? Monocytes Relative 2 %  ? Monocytes Absolute 0.2 0.

## 2022-01-02 NOTE — Assessment & Plan Note (Signed)
BP 107/58 on admission, secondary to hypovolemia, sepsis ?IV hydration and hold home antihypertensives for now ?

## 2022-01-02 NOTE — ED Notes (Signed)
Pt is alert and oriented, hard of hearing, speaking in full sentences, purick and flex seal in placed. Breathing easy and unlabored, will continue to monitor. ?

## 2022-01-02 NOTE — ED Notes (Signed)
Stool sample collected and sent to lab.

## 2022-01-02 NOTE — ED Notes (Signed)
Received report from [employer]. ?

## 2022-01-02 NOTE — ED Notes (Signed)
Pt's family member Nira Conn called at [phone] and notified that the pt has a bed in rm 102. ?

## 2022-01-03 DIAGNOSIS — A080 Rotaviral enteritis: Secondary | ICD-10-CM | POA: Diagnosis not present

## 2022-01-03 LAB — CBC
HCT: 33.1 % — ABNORMAL LOW (ref 36.0–46.0)
Hemoglobin: 11.1 g/dL — ABNORMAL LOW (ref 12.0–15.0)
MCH: 31.8 pg (ref 26.0–34.0)
MCHC: 33.5 g/dL (ref 30.0–36.0)
MCV: 94.8 fL (ref 80.0–100.0)
Platelets: 228 10*3/uL (ref 150–400)
RBC: 3.49 MIL/uL — ABNORMAL LOW (ref 3.87–5.11)
RDW: 12.8 % (ref 11.5–15.5)
WBC: 9.8 10*3/uL (ref 4.0–10.5)
nRBC: 0 % (ref 0.0–0.2)

## 2022-01-03 LAB — COMPREHENSIVE METABOLIC PANEL
ALT: 63 U/L — ABNORMAL HIGH (ref 0–44)
AST: 70 U/L — ABNORMAL HIGH (ref 15–41)
Albumin: 2.9 g/dL — ABNORMAL LOW (ref 3.5–5.0)
Alkaline Phosphatase: 110 U/L (ref 38–126)
Anion gap: 6 (ref 5–15)
BUN: 14 mg/dL (ref 8–23)
CO2: 23 mmol/L (ref 22–32)
Calcium: 9.3 mg/dL (ref 8.9–10.3)
Chloride: 106 mmol/L (ref 98–111)
Creatinine, Ser: 0.67 mg/dL (ref 0.44–1.00)
GFR, Estimated: >60 mL/min (ref >60)
Glucose, Bld: 108 mg/dL — ABNORMAL HIGH (ref 70–99)
Potassium: 3.5 mmol/L (ref 3.5–5.1)
Sodium: 135 mmol/L (ref 135–145)
Total Bilirubin: 0.6 mg/dL (ref 0.3–1.2)
Total Protein: 5.5 g/dL — ABNORMAL LOW (ref 6.5–8.1)

## 2022-01-03 MED ORDER — ASPIRIN 81 MG PO CHEW
81.0000 mg | CHEWABLE_TABLET | Freq: Every day | ORAL | Status: DC
  Administered 2022-01-04 – 2022-01-05 (×2): 81 mg via ORAL
  Filled 2022-01-03 (×2): qty 1

## 2022-01-03 MED ORDER — MIRTAZAPINE 15 MG PO TABS
7.5000 mg | ORAL_TABLET | Freq: Every day | ORAL | Status: DC
  Administered 2022-01-03 – 2022-01-05 (×3): 7.5 mg via ORAL
  Filled 2022-01-03 (×3): qty 1

## 2022-01-03 MED ORDER — ONDANSETRON HCL 4 MG/2ML IJ SOLN: 4.0000 mg | Freq: Four times a day (QID) | INTRAMUSCULAR | Status: DC | PRN

## 2022-01-03 MED ORDER — MIRABEGRON ER 50 MG PO TB24
50.0000 mg | ORAL_TABLET | Freq: Every day | ORAL | Status: DC
Start: 2022-01-03 — End: 2022-01-05
  Administered 2022-01-03 – 2022-01-05 (×3): 50 mg via ORAL
  Filled 2022-01-03 (×3): qty 1

## 2022-01-03 MED ORDER — ONDANSETRON 4 MG PO TBDP
4.0000 mg | ORAL_TABLET | Freq: Three times a day (TID) | ORAL | Status: DC | PRN
Start: 2022-01-03 — End: 2022-01-05

## 2022-01-03 MED ORDER — LOPERAMIDE HCL 2 MG PO CAPS: 2.0000 mg | ORAL_CAPSULE | ORAL | Status: DC | PRN

## 2022-01-03 MED ORDER — LEVOTHYROXINE SODIUM 50 MCG PO TABS
50.0000 ug | ORAL_TABLET | Freq: Every day | ORAL | Status: DC
  Administered 2022-01-04 – 2022-01-05 (×2): 50 ug via ORAL
  Filled 2022-01-03 (×2): qty 1

## 2022-01-03 MED ORDER — SERTRALINE HCL 50 MG PO TABS
50.0000 mg | ORAL_TABLET | Freq: Every day | ORAL | Status: DC
  Administered 2022-01-04 – 2022-01-05 (×2): 50 mg via ORAL
  Filled 2022-01-03 (×3): qty 1

## 2022-01-03 NOTE — Plan of Care (Signed)

## 2022-01-03 NOTE — Evaluation (Signed)
Physical Therapy Evaluation ?Patient Details ?Name: Margaret Francis ?MRN: [id number] ?DOB: 06-15-1930 ?Today's Date: 01/03/2022 ? ?History of Present Illness ? Patient is a 88 year old female with a PMH (+) for Hypertension, prior stroke, hypothyroidism, who presents with a several day history of nausea vomiting and diarrhea, generalized weakness ?  ?Clinical Impression ? Physical Therapy Evaluation completed this date. Patient tolerated session fairly well and was agreeable to treatment. Patient states she lives in a 2 story home (basement- that she does not use) by herself with about 6 STE and bilateral hand rails. At baseline, patient ambulates with a rollator, and has friends and neighbors assist with bathing and chores around the house. Patient states she can dress herself independently, however required assistance with donning socks after multiple failed attempts.  ? ?Patient demonstrated decreased BLE strength (at least 3/5). All bed mobility was completed at SUP with increased time and effort, and with bed in reverse trendelenburg patient was able to reposition herself in bed at SUP. Sit to stands from low heights (such as BSC and lowest level EOB) required Min A with RW and multiple attempts to complete. Patient was unable to stand for long periods of time due to increased low back pain with activity. From elevated surface, patient was able to complete sit to stand with RW at [facility] on the first attempt. CGA was required for step pivot transfer to/from [facility] with RW with increased unsteadiness noted. Patient is demonstrated decreased strength, balance, and activity tolerance to safely complete ADLs. Patient would continue to benefit from skilled physical therapy in order to optimize patient's return to PLOF. Recommend STR upon discharge from acute hospitalization.  ?   ? ?Recommendations for follow up therapy are one component of a multi-disciplinary discharge planning process, led by the attending physician.   Recommendations may be updated based on patient status, additional functional criteria and insurance authorization. ? ?Follow Up Recommendations Skilled nursing-short term rehab (<3 hours/day) ? ?  ?Assistance Recommended at Discharge Frequent or constant Supervision/Assistance  ?Patient can return home with the following ? A lot of help with bathing/dressing/bathroom;A lot of help with walking and/or transfers;Help with stairs or ramp for entrance ? ?  ?Equipment Recommendations Other (comment) (TBD)  ?Recommendations for Other Services ?    ?  ?Functional Status Assessment Patient has had a recent decline in their functional status and demonstrates the ability to make significant improvements in function in a reasonable and predictable amount of time.  ? ?  ?Precautions / Restrictions Precautions ?Precautions: Fall ?Restrictions ?Weight Bearing Restrictions: No  ? ?  ? ?Mobility ? Bed Mobility ?Overal bed mobility: Needs Assistance ?Bed Mobility: Supine to Sit, Sit to Supine ?  ?  ?Supine to sit: Supervision, HOB elevated ?Sit to supine: Supervision, HOB elevated ?  ?General bed mobility comments: patient able to reposition herself when bed was placed in reverse trendelenburg ?  ? ?Transfers ?Overall transfer level: Needs assistance ?Equipment used: Rolling walker (2 wheels) ?Transfers: Sit to/from Stand, Bed to chair/wheelchair/BSC ?Sit to Stand: Min assist, From elevated surface, Min guard ?  ?Step pivot transfers: Min guard ?  ?  ?  ?General transfer comment: patient required multiple sit to stand attempts from [facility] and low placed EOB with cueing on proper hand placement. Patient was able to complete at [facility] from elevated bed height on first attempt ?  ? ?Ambulation/Gait ?Ambulation/Gait assistance:  (deferred this date for safety) ?  ?  ?  ?  ?  ?  ?  ? ?  Stairs ?  ?  ?  ?  ?  ? ?Wheelchair Mobility ?  ? ?Modified Rankin (Stroke Patients Only) ?  ? ?  ? ?Balance Overall balance assessment: Needs  assistance ?Sitting-balance support: Bilateral upper extremity supported, Feet supported ?Sitting balance-Leahy Scale: Good ?  ?  ?Standing balance support: Bilateral upper extremity supported, During functional activity, Reliant on assistive device for balance ?Standing balance-Leahy Scale: Poor ?  ?  ?  ?  ?  ?  ?  ?  ?  ?  ?  ?  ?   ? ? ? ?Pertinent Vitals/Pain Pain Assessment ?Pain Assessment: No/denies pain  ? ? ?Home Living Family/patient expects to be discharged to:: Private residence ?Living Arrangements: Alone ?Available Help at Discharge: Friend(s);Available PRN/intermittently ?Type of Home: House ?Home Access: Stairs to enter ?Entrance Stairs-Rails: Right;Left;Can reach both ?Entrance Stairs-Number of Steps: 6 ?  ?Home Layout: Two level (does not go into the basement) ?Home Equipment: Crutches;Cane - quad;Rollator (4 wheels);Grab bars - toilet;Grab bars - tub/shower ?   ?  ?Prior Function Prior Level of Function : Independent/Modified Independent;History of Falls (last six months) ?  ?  ?  ?  ?  ?  ?Mobility Comments: multiple falls from getting into/out of bed, new mattress and bed height just ordered ?ADLs Comments: assistance bathing, patient able to dress herself ?  ? ? ?Hand Dominance  ? Dominant Hand: Left ? ?  ?Extremity/Trunk Assessment  ? Upper Extremity Assessment ?Upper Extremity Assessment: Generalized weakness (at least 3+/5 strength bilaterally, ROM WFL) ?  ? ?Lower Extremity Assessment ?Lower Extremity Assessment:  (3/5 strength bilaterally) ?  ? ?   ?Communication  ? Communication: No difficulties  ?Cognition Arousal/Alertness: Awake/alert ?Behavior During Therapy: [facility] for tasks assessed/performed ?Overall Cognitive Status: Within Functional Limits for tasks assessed ?  ?  ?  ?  ?  ?  ?  ?  ?  ?  ?  ?  ?  ?  ?  ?  ?General Comments: A&Ox3 self location and situation ?  ?  ? ?  ?General Comments General comments (skin integrity, edema, etc.): bruising along bilateral lateral LEs and L side  from previous falls ? ?  ?Exercises Other Exercises ?Other Exercises: patient was educated on role of PT in acute care setting, fall risk, and d/c recommendations  ? ?Assessment/Plan  ?  ?PT Assessment Patient needs continued PT services  ?PT Problem List Decreased strength;Decreased mobility;Decreased activity tolerance;Decreased balance;Pain ? ?   ?  ?PT Treatment Interventions DME instruction;Therapeutic activities;Gait training;Therapeutic exercise;Balance training;Stair training   ? ?PT Goals (Current goals can be found in the Care Plan section)  ?Acute Rehab PT Goals ?Patient Stated Goal: to get stronger. Patient states that she does not feel she can go home at this time ?PT Goal Formulation: With patient ?Time For Goal Achievement: 01/17/22 ?Potential to Achieve Goals: Good ? ?  ?Frequency Min 2X/week ?  ? ? ?Co-evaluation   ?  ?  ?  ?  ? ? ?  ?AM-PAC PT "6 Clicks" Mobility  ?Outcome Measure Help needed turning from your back to your side while in a flat bed without using bedrails?: A Little ?Help needed moving from lying on your back to sitting on the side of a flat bed without using bedrails?: A Little ?Help needed moving to and from a bed to a chair (including a wheelchair)?: A Lot ?Help needed standing up from a chair using your arms (e.g., wheelchair or bedside chair)?: A Lot ?  Help needed to walk in hospital room?: A Lot ?Help needed climbing 3-5 steps with a railing? : A Lot ?6 Click Score: 14 ? ?  ?End of Session Equipment Utilized During Treatment: Gait belt ?Activity Tolerance: Patient tolerated treatment well ?Patient left: in bed;with call bell/phone within reach;with bed alarm set ?Nurse Communication: Mobility status ?PT Visit Diagnosis: Unsteadiness on feet (R26.81);Muscle weakness (generalized) (M62.81);Repeated falls (R29.6);History of falling (Z91.81);Pain ?Pain - Right/Left:  (bilaterally, and low back) ?  ? ?Time: 6861-6837 ?PT Time Calculation (min) (ACUTE ONLY): 31 min ? ? ?Charges:   PT  Evaluation ?$PT Eval Low Complexity: 1 Low ?PT Treatments ?$Therapeutic Activity: 8-22 mins ?  ?   ? ? ?Iva Boop, PT  ?01/03/22. 11:32 AM ? ? ?

## 2022-01-03 NOTE — Progress Notes (Signed)
Verified with patient POA, Nira Conn, that patient is a DNR. This RN verified with patient who is A/Ox4 that she is DNR and has DNR form at home. MD Billie Ruddy notified, order changed to be DNR. Nira Conn states she can obtain the paperwork for her chart sometime today.  ?

## 2022-01-03 NOTE — Evaluation (Addendum)
Occupational Therapy Evaluation Patient Details Name: Margaret Francis MRN: [id number] DOB: [birth year] Today's Date: 01/03/2022   History of Present Illness Pt is a 88 year old female admitted with a several day history of nausea vomiting and diarrhea, generalized weakness with falls and now associated with fever, diagnosed with rotavirus.  PMH significant for Hypertension, prior stroke, hypothyroidism   Clinical Impression   Chart reviewed, RN cleared pt for participation in OT evaluation. Pt greeted semi supine in bed, agreeable to evaluation, alert and oriented x4, good awareness of current deficits. PTA pt lives alone, was supervision-MOD I in ADL, assist required for bathing from home health aid. She reports she has an aid 2x per day for a few hours each visit. Aids assist with IADLs such as grocery shopping, driving, meal prep, laundry.  Pt presents with deficits in strength, endurance, balance all affecting safe and optimal ADL/functional mobility completion. Pt performs bed mobility with MIN A with frequent vcs for technique, STS with RW with MIN A, amb approx 5 feet in room, then short amb transfer with CGA-MIN A, stand at edge of bed for approx 3 minutes with supervision-CGA. Pt is performing ADL/mobility below PLOF and would benefit from discharge to STR to address deficits. OT will continue to follow acutely.    Recommendations for follow up therapy are one component of a multi-disciplinary discharge planning process, led by the attending physician.  Recommendations may be updated based on patient status, additional functional criteria and insurance authorization.   Follow Up Recommendations  Skilled nursing-short term rehab (<3 hours/day)    Assistance Recommended at Discharge Frequent or constant Supervision/Assistance  Patient can return home with the following A little help with walking and/or transfers;A lot of help with bathing/dressing/bathroom;Assistance with  cooking/housework;Assist for transportation;Help with stairs or ramp for entrance    Functional Status Assessment  Patient has had a recent decline in their functional status and demonstrates the ability to make significant improvements in function in a reasonable and predictable amount of time.  Equipment Recommendations  Other (comment) (per next venue of care)    Recommendations for Other Services       Precautions / Restrictions Precautions Precautions: Fall Restrictions Weight Bearing Restrictions: No      Mobility Bed Mobility Overal bed mobility: Needs Assistance Bed Mobility: Supine to Sit     Supine to sit: Min assist, HOB elevated          Transfers Overall transfer level: Needs assistance Equipment used: Rolling walker (2 wheels) Transfers: Sit to/from Stand Sit to Stand: Min guard, Min assist                  Balance Overall balance assessment: Needs assistance Sitting-balance support: Bilateral upper extremity supported, Feet supported Sitting balance-Leahy Scale: Good     Standing balance support: Bilateral upper extremity supported, During functional activity, Reliant on assistive device for balance Standing balance-Leahy Scale: Fair                             ADL either performed or assessed with clinical judgement   ADL Overall ADL's : Needs assistance/impaired Eating/Feeding: Sitting;Set up   Grooming: Wash/dry face;Sitting;Set up               Lower Body Dressing: Maximal assistance Lower Body Dressing Details (indicate cue type and reason): anticipated Toilet Transfer: Min guard;Minimal assistance;Rolling walker (2 wheels) Toilet Transfer Details (indicate cue type and reason): simulated; short  amb transfer to bedside chair with RW Toileting- Clothing Manipulation and Hygiene: Maximal assistance         General ADL Comments: pt stood at edge of bed for approx 3 minutes before reporting discomfort in lower back  and requesting to sit     Vision Patient Visual Report: No change from baseline       Perception     Praxis      Pertinent Vitals/Pain Pain Assessment Pain Assessment: Faces Faces Pain Scale: Hurts little more Pain Location: lower back with mobility Pain Descriptors / Indicators: Grimacing Pain Intervention(s): Limited activity within patient's tolerance, Monitored during session, Repositioned     Hand Dominance Left   Extremity/Trunk Assessment Upper Extremity Assessment Upper Extremity Assessment: Generalized weakness   Lower Extremity Assessment Lower Extremity Assessment: Generalized weakness       Communication Communication Communication: HOH   Cognition Arousal/Alertness: Awake/alert Behavior During Therapy: WFL for tasks assessed/performed Overall Cognitive Status: Within Functional Limits for tasks assessed                                 General Comments: good awareness of deficits and needs     General Comments  brusing along BLEs, L side from falls;    Exercises     Shoulder Instructions      Home Living Family/patient expects to be discharged to:: Private residence Living Arrangements: Alone Available Help at Discharge: Friend(s);Available PRN/intermittently (has HHA 2x per day, a few hours each visit per pt report) Type of Home: House Home Access: Stairs to enter [employer] of Steps: 6 Entrance Stairs-Rails: Right;Left;Can reach both Home Layout: Two level     Bathroom Shower/Tub: Producer, television/film/video: Handicapped height Bathroom Accessibility: Yes   Home Equipment: Crutches;Cane - quad;Rollator (4 wheels);Grab bars - toilet;Grab bars - tub/shower;Shower seat, life alert           Prior Functioning/Environment Prior Level of Function : Independent/Modified Independent;History of Falls (last six months)             Mobility Comments: multiple falls from getting into/out of bed, new  mattress and bed height just ordered ADLs Comments: pt performs ADL with supervision-MOD I, home health aid assists with bathing; HHA also assists with meal prep, driving, grocery shopping, laundry        OT Problem List: Decreased strength;Decreased knowledge of use of DME or AE;Decreased activity tolerance;Impaired balance (sitting and/or standing)      OT Treatment/Interventions: Self-care/ADL training;Therapeutic exercise;Patient/family education;DME and/or AE instruction;Therapeutic activities    OT Goals(Current goals can be found in the care plan section) Acute Rehab OT Goals Patient Stated Goal: go to rehab OT Goal Formulation: With patient Time For Goal Achievement: 01/17/22 Potential to Achieve Goals: Good ADL Goals Pt Will Perform Grooming: standing;sitting;with supervision Pt Will Perform Upper Body Dressing: sitting;with set-up Pt Will Perform Lower Body Dressing: sit to/from stand;with supervision Pt Will Transfer to Toilet: with supervision;ambulating Pt Will Perform Toileting - Clothing Manipulation and hygiene: sit to/from stand;with supervision  OT Frequency: Min 2X/week    Co-evaluation              AM-PAC OT "6 Clicks" Daily Activity     Outcome Measure Help from another person eating meals?: None Help from another person taking care of personal grooming?: None Help from another person toileting, which includes using toliet, bedpan, or urinal?: A Lot Help from another person  bathing (including washing, rinsing, drying)?: A Lot Help from another person to put on and taking off regular upper body clothing?: A Little Help from another person to put on and taking off regular lower body clothing?: A Lot 6 Click Score: 17   End of Session Equipment Utilized During Treatment: Gait belt;Rolling walker (2 wheels) Nurse Communication: Mobility status  Activity Tolerance: Patient tolerated treatment well Patient left: in chair;with call bell/phone within  reach  OT Visit Diagnosis: History of falling (Z91.81)                Time: 5784-6962 OT Time Calculation (min): 30 min Charges:  OT General Charges $OT Visit: 1 Visit OT Evaluation $OT Eval Low Complexity: 1 Low OT Treatments $Self Care/Home Management : 8-22 mins  Oleta Mouse, OTD OTR/L  01/03/22, 2:38 PM

## 2022-01-03 NOTE — Progress Notes (Signed)
?  Progress Note ? ? ?Patient: Margaret Francis PJK:[id number] DOB: 1930-08-07 DOA: 01/02/2022     1 ?DOS: the patient was seen and examined on 01/03/2022 ?  ?Brief hospital course: ?No notes on file ? ?Assessment and Plan: ?* Intestinal infection, enteritis due to rotavirus ?--rectal tube placed in the ED on presentation, presumably due to severe diarrhea.  Rectal tube removed today.  Diarrhea improved. ?--received vanc cefepime and Flagyl in the ED but given positive rotavirus, abx were not continued. ?Plan: ?--Imodium PRN ?--encourage oral hydration, currently no need for more IVF ? ?Sepsis ([place]) ?Secondary to rotavirus enteritis ?Sepsis criteria includes Tmax 102.8, tachycardia to 103 tachypnea of 24, mild hypoxia of 93% on room air.  Normal WBC and lactic acid ?--received sepsis fluids and supportive care for diarrheal illness ? ? ?Hyponatremia ?Hypovolemic related to diarrhea.  Resolved with IVF. ? ?Transaminitis ?Mild.  Likely related to acute infectious diarrhea ? ? ?Frequent falls ?Weakness ?No evidence of acute injury on x-ray hip and CT head ?--Pt felt significantly weaker than baseline due to this episode of acute GI illness with constant diarrhea PTA, and as a result, had multiple falls at home. ?--PT/OT rec SNF rehab ? ?Thyroid disease ?Continue levothyroxine ? ?History of stroke ?Continue aspirin and statin ? ?Hypertension ?--soft blood pressures on admission of 107/58 ?--hold home Losartan ? ? ? ? ?  ? ?Subjective:  ?Removed pt's rectal tube (placed in the ED).  Pt reported diarrhea improved, however, felt significantly weaker than baseline due to this episode of acute GI illness with constant diarrhea PTA.  Now pt is 2 people assist just to get up.  PT/OT rec SNF rehab. ? ? ?Physical Exam: ? ?Constitutional: NAD, AAOx3 ?HEENT: conjunctivae and lids normal, EOMI ?CV: No cyanosis.   ?RESP: normal respiratory effort, on RA ?Extremities: No effusions, edema in BLE ?SKIN: warm, dry ?Neuro: II - XII grossly  intact.   ?Psych: Normal mood and affect.  Appropriate judgement and reason ? ? ?Data Reviewed: ? ?Family Communication:  ? ?Disposition: ?Status is: Inpatient ? ? Planned Discharge Destination: Rehab ? ? ? ?Time spent: 50 minutes ? ?Author: ?Enzo Bi, MD ?01/03/2022 7:02 PM ? ?For on call review [email or web address].  ?

## 2022-01-04 DIAGNOSIS — E663 Overweight: Secondary | ICD-10-CM | POA: Diagnosis not present

## 2022-01-04 DIAGNOSIS — A080 Rotaviral enteritis: Secondary | ICD-10-CM | POA: Diagnosis not present

## 2022-01-04 DIAGNOSIS — R7401 Elevation of levels of liver transaminase levels: Secondary | ICD-10-CM | POA: Diagnosis not present

## 2022-01-04 DIAGNOSIS — A419 Sepsis, unspecified organism: Secondary | ICD-10-CM | POA: Diagnosis not present

## 2022-01-04 LAB — BASIC METABOLIC PANEL
Anion gap: 8 (ref 5–15)
BUN: 13 mg/dL (ref 8–23)
CO2: 26 mmol/L (ref 22–32)
Calcium: 9.9 mg/dL (ref 8.9–10.3)
Chloride: 106 mmol/L (ref 98–111)
Creatinine, Ser: 0.57 mg/dL (ref 0.44–1.00)
GFR, Estimated: >60 mL/min (ref >60)
Glucose, Bld: 117 mg/dL — ABNORMAL HIGH (ref 70–99)
Potassium: 3.8 mmol/L (ref 3.5–5.1)
Sodium: 140 mmol/L (ref 135–145)

## 2022-01-04 LAB — HEPATIC FUNCTION PANEL
ALT: 51 U/L — ABNORMAL HIGH (ref 0–44)
AST: 40 U/L (ref 15–41)
Albumin: 3 g/dL — ABNORMAL LOW (ref 3.5–5.0)
Alkaline Phosphatase: 115 U/L (ref 38–126)
Bilirubin, Direct: 0.1 mg/dL (ref 0.0–0.2)
Indirect Bilirubin: 0.1 mg/dL — ABNORMAL LOW (ref 0.3–0.9)
Total Bilirubin: 0.2 mg/dL — ABNORMAL LOW (ref 0.3–1.2)
Total Protein: 5.9 g/dL — ABNORMAL LOW (ref 6.5–8.1)

## 2022-01-04 LAB — CBC
HCT: 34.4 % — ABNORMAL LOW (ref 36.0–46.0)
Hemoglobin: 11.4 g/dL — ABNORMAL LOW (ref 12.0–15.0)
MCH: 30.8 pg (ref 26.0–34.0)
MCHC: 33.1 g/dL (ref 30.0–36.0)
MCV: 93 fL (ref 80.0–100.0)
Platelets: 226 10*3/uL (ref 150–400)
RBC: 3.7 MIL/uL — ABNORMAL LOW (ref 3.87–5.11)
RDW: 12.8 % (ref 11.5–15.5)
WBC: 4.4 10*3/uL (ref 4.0–10.5)
nRBC: 0 % (ref 0.0–0.2)

## 2022-01-04 LAB — MAGNESIUM: Magnesium: 2 mg/dL (ref 1.7–2.4)

## 2022-01-04 NOTE — Hospital Course (Addendum)
88 year old female with past medical history of hypertension, hypothyroidism who presented to the emergency room on 5/15 with several days of nausea/vomiting and diarrhea and found to have sepsis secondary to enteritis caused by rotavirus.  Patient treated aggressively with IV fluids.  Seen by PT and OT who recommended skilled nursing and accepted for skilled nursing on 5/18.

## 2022-01-04 NOTE — Assessment & Plan Note (Signed)
Patient meets criteria with BMI greater than 25 

## 2022-01-04 NOTE — TOC Initial Note (Signed)
Transition of Care (TOC) - Initial/Assessment Note  ? ? ?Patient Details  ?Name: Margaret Francis ?MRN: [id number] ?Date of Birth: 04/24/1930 ? ?Transition of Care (TOC) CM/SW Contact:    ?Alberteen Sam, LCSW ?Phone Number: ?01/04/2022, 10:52 AM ? ?Clinical Narrative:                 ? ?CSW spoke with patient regarding SNF recommendations from PT, she reports being in agreement with preference for [facility].  ? ?CSW has sent referral pending bed offers.  ? ?Expected Discharge Plan: [place] ?Barriers to Discharge: Continued Medical Work up ? ? ?Patient Goals and CMS Choice ?Patient states their goals for this hospitalization and ongoing recovery are:: to go home ?CMS Medicare.gov Compare Post Acute Care list provided to:: Patient ?Choice offered to / list presented to : Patient ? ?Expected Discharge Plan and Services ?Expected Discharge Plan: [place] ?  ?  ?  ?Living arrangements for the past 2 months: [place] ?                ?  ?  ?  ?  ?  ?  ?  ?  ?  ?  ? ?Prior Living Arrangements/Services ?Living arrangements for the past 2 months: [place] ?Lives with:: Self ?Patient language and need for interpreter reviewed:: Yes ?       ?  ?  ?  ?  ? ?Activities of Daily Living ?Home Assistive Devices/Equipment: Gilford Rile (specify type) ?ADL Screening (condition at time of admission) ?Patient's cognitive ability adequate to safely complete daily activities?: Yes ?Is the patient deaf or have difficulty hearing?: No ?Does the patient have difficulty seeing, even when wearing glasses/contacts?: No ?Does the patient have difficulty concentrating, remembering, or making decisions?: No ?Patient able to express need for assistance with ADLs?: Yes ?Does the patient have difficulty dressing or bathing?: No ?Independently performs ADLs?: Yes (appropriate for developmental age) ?Does the patient have difficulty walking or climbing stairs?: Yes ?Weakness of Legs: Both ?Weakness of  Arms/Hands: None ? ?Permission Sought/Granted ?  ?  ?   ?   ?   ?   ? ?Emotional Assessment ?  ?Attitude/Demeanor/Rapport: Gracious ?Affect (typically observed): Calm ?Orientation: : Oriented to Self, Oriented to Place, Oriented to  Time, Oriented to Situation ?Alcohol / Substance Use: Not Applicable ?Psych Involvement: No (comment) ? ?Admission diagnosis:  Colitis [K52.9] ?Sepsis ([place]) [A41.9] ?Diarrhea, unspecified type [R19.7] ?Patient Active Problem List  ? Diagnosis Date Noted  ? Overweight (BMI 25.0-29.9) 01/04/2022  ? Sepsis ([place]) 01/02/2022  ? Hypertension   ? History of stroke   ? Thyroid disease   ? Intestinal infection, enteritis due to rotavirus   ? Transaminitis   ? Frequent falls   ? Peripheral neuropathy 11/29/2020  ? Cerebral ventriculomegaly 11/29/2020  ? Bilateral foot-drop 07/28/2020  ? Abnormality of gait 03/20/2013  ? Urinary frequency 04/19/2011  ? ?PCP:  Seward Carol, MD ?Pharmacy:   ?[place], [place] ?[address] ?[place] [address] ?Phone: [phone] Fax: [phone] ? ?[place], [place] ?[address] ?[address] ?[place] [address] ?Phone: [phone] Fax: [phone] ? ? ? ? ?Social Determinants of Health (SDOH) Interventions ?  ? ?Readmission Risk Interventions ?   ? View : No data to display.  ?  ?  ?  ? ? ? ?

## 2022-01-04 NOTE — NC FL2 (Signed)
?[place] MEDICAID FL2 LEVEL OF CARE SCREENING TOOL  ?  ? ?IDENTIFICATION  ?Patient Name: ?Margaret Francis Birthdate: [birth year] Sex: female Admission Date (Current Location): ?01/02/2022  ?[place] and [place] Number: ? [place] ?  Facility and Address:  ?[facility], [address], [place], [place] [address] ?     Provider Number: ?[id number]  ?Attending Physician Name and Address:  ?Annita Brod, MD ? Relative Name and Phone Number:  ?Nira Conn [phone] ?   ?Current Level of Care: ?Hospital Recommended Level of Care: ?[place] Prior Approval Number: ?  ? ?Date Approved/Denied: ?  PASRR Number: ?[id number] A ? ?Discharge Plan: ?SNF ?  ? ?Current Diagnoses: ?Patient Active Problem List  ? Diagnosis Date Noted  ? Overweight (BMI 25.0-29.9) 01/04/2022  ? Sepsis ([place]) 01/02/2022  ? Hypertension   ? History of stroke   ? Thyroid disease   ? Intestinal infection, enteritis due to rotavirus   ? Transaminitis   ? Frequent falls   ? Peripheral neuropathy 11/29/2020  ? Cerebral ventriculomegaly 11/29/2020  ? Bilateral foot-drop 07/28/2020  ? Abnormality of gait 03/20/2013  ? Urinary frequency 04/19/2011  ? ? ?Orientation RESPIRATION BLADDER Height & Weight   ?  ?Self, Time, Situation, Place ? Normal Incontinent, External catheter Weight: 190 lb (86.2 kg) ?Height:  '5\' 7"'$  (170.2 cm)  ?BEHAVIORAL SYMPTOMS/MOOD NEUROLOGICAL BOWEL NUTRITION STATUS  ?    Continent Diet (see discharge summary)  ?AMBULATORY STATUS COMMUNICATION OF NEEDS Skin   ?Limited Assist Verbally Normal ?  ?  ?  ?    ?     ?     ? ? ?Personal Care Assistance Level of Assistance  ?Bathing, Feeding, Total care, Dressing Bathing Assistance: Limited assistance ?Feeding assistance: Independent ?Dressing Assistance: Limited assistance ?Total Care Assistance: Limited assistance  ? ?Functional Limitations Info  ?Sight, Hearing, Speech Sight Info: Adequate ?Hearing Info: Adequate ?Speech Info: Adequate  ? ? ?SPECIAL CARE  FACTORS FREQUENCY  ?PT (By licensed PT), OT (By licensed OT)   ?  ?PT Frequency: min 4x weekly ?OT Frequency: min 4x weekly ?  ?  ?  ?   ? ? ?Contractures Contractures Info: Not present  ? ? ?Additional Factors Info  ?Code Status, Allergies Code Status Info: DNR ?Allergies Info: demerol, morphine and related ?  ?  ?  ?   ? ?Current Medications (01/04/2022):  This is the current hospital active medication list ?Current Facility-Administered Medications  ?Medication Dose Route Frequency Provider Last Rate Last Admin  ? acetaminophen (TYLENOL) tablet 650 mg  650 mg Oral Q6H PRN Athena Masse, MD   650 mg at 01/03/22 1623  ? Or  ? acetaminophen (TYLENOL) suppository 650 mg  650 mg Rectal Q6H PRN Athena Masse, MD      ? aspirin chewable tablet 81 mg  81 mg Oral Daily Enzo Bi, MD   81 mg at 01/04/22 5093  ? enoxaparin (LOVENOX) injection 40 mg  40 mg Subcutaneous Q24H Athena Masse, MD   40 mg at 01/04/22 2671  ? levothyroxine (SYNTHROID) tablet 50 mcg  50 mcg Oral Q0600 Enzo Bi, MD   50 mcg at 01/04/22 0539  ? loperamide (IMODIUM) capsule 2 mg  2 mg Oral PRN Enzo Bi, MD      ? mirabegron ER [facility]) tablet 50 mg  50 mg Oral Daily Enzo Bi, MD   50 mg at 01/04/22 1034  ? mirtazapine (REMERON) tablet 7.5 mg  7.5 mg Oral Daily Billie Ruddy,  Otila Kluver, MD   7.5 mg at 01/04/22 1034  ? ondansetron (ZOFRAN) injection 4 mg  4 mg Intravenous Q6H PRN Enzo Bi, MD      ? ondansetron (ZOFRAN-ODT) disintegrating tablet 4 mg  4 mg Oral Q8H PRN Enzo Bi, MD      ? sertraline (ZOLOFT) tablet 50 mg  50 mg Oral Daily Enzo Bi, MD   50 mg at 01/04/22 0831  ? ? ? ?Discharge Medications: ?Please see discharge summary for a list of discharge medications. ? ?Relevant Imaging Results: ? ?Relevant Lab Results: ? ? ?Additional Information ?[id number] ? ?Alberteen Sam, LCSW ? ? ? ? ?

## 2022-01-04 NOTE — Progress Notes (Signed)
Margaret Francis at bedside, and stated case manager did not call her. I stated they will reach out to her tomorrow to discuss placement options. Patient also requesting a private room when she goes to a rehab.  ?

## 2022-01-04 NOTE — Progress Notes (Signed)
Physical Therapy Treatment ?Patient Details ?Name: Margaret Francis ?MRN: [id number] ?DOB: 1930/08/07 ?Today's Date: 01/04/2022 ? ? ?History of Present Illness Pt is a 88 year old female admitted with a several day history of nausea vomiting and diarrhea, generalized weakness with falls and now associated with fever, diagnosed with rotavirus.  PMH significant for Hypertension, prior stroke, hypothyroidism ? ?  ?PT Comments  ? ? Patient received in recliner. Reports she would like to walk. Patient requests using bathroom. BSC brought out to patient, but upon standing patient urinated on floor. She required mod assist to stand from recliner. Multiple attempts to be successful. Min/mod assist for ambulation with RW due to unsteadiness and poor balance. Patient is at increased fall risk. She will continue to benefit from skilled PT while here to improve functional independence, strength and safety.  ?    ?Recommendations for follow up therapy are one component of a multi-disciplinary discharge planning process, led by the attending physician.  Recommendations may be updated based on patient status, additional functional criteria and insurance authorization. ? ?Follow Up Recommendations ? Skilled nursing-short term rehab (<3 hours/day) ?  ?  ?Assistance Recommended at Discharge Frequent or constant Supervision/Assistance  ?Patient can return home with the following A lot of help with bathing/dressing/bathroom;A lot of help with walking and/or transfers;Help with stairs or ramp for entrance ?  ?Equipment Recommendations ? None recommended by PT (TBD)  ?  ?Recommendations for Other Services   ? ? ?  ?Precautions / Restrictions Precautions ?Precautions: Fall ?Restrictions ?Weight Bearing Restrictions: No  ?  ? ?Mobility ? Bed Mobility ?Overal bed mobility: Needs Assistance ?Bed Mobility: Sit to Supine ?  ?  ?  ?Sit to supine: Min assist ?  ?General bed mobility comments: Required min A to bring LEs up onto bed, had a lot of back  pain with this ?  ? ?Transfers ?Overall transfer level: Needs assistance ?Equipment used: Rolling walker (2 wheels) ?Transfers: Sit to/from Stand ?Sit to Stand: Min assist, Mod assist ?  ?  ?  ?  ?  ?General transfer comment: poor ability to power up from low recliner and with initial standing balance. ?  ? ?Ambulation/Gait ?Ambulation/Gait assistance: Min assist ?Gait Distance (Feet): 15 Feet ?Assistive device: Rolling walker (2 wheels) ?Gait Pattern/deviations: Step-through pattern, Decreased step length - right, Decreased step length - left, Decreased stride length, Trunk flexed ?Gait velocity: decr ?  ?  ?General Gait Details: patient wobbly when ambulating, increased fall risk. Requires minA for safety. Fatigued with minimal activity and gait ? ? ?Stairs ?  ?  ?  ?  ?  ? ? ?Wheelchair Mobility ?  ? ?Modified Rankin (Stroke Patients Only) ?  ? ? ?  ?Balance Overall balance assessment: Needs assistance, History of Falls ?Sitting-balance support: Feet supported ?Sitting balance-Leahy Scale: Good ?  ?  ?Standing balance support: Bilateral upper extremity supported, During functional activity, Reliant on assistive device for balance ?Standing balance-Leahy Scale: Poor ?Standing balance comment: poor balance ?  ?  ?  ?  ?  ?  ?  ?  ?  ?  ?  ?  ? ?  ?Cognition Arousal/Alertness: Awake/alert ?Behavior During Therapy: [facility] for tasks assessed/performed ?Overall Cognitive Status: Within Functional Limits for tasks assessed ?  ?  ?  ?  ?  ?  ?  ?  ?  ?  ?  ?  ?  ?  ?  ?  ?  ?  ?  ? ?  ?Exercises   ? ?  ?  General Comments   ?  ?  ? ?Pertinent Vitals/Pain Pain Assessment ?Pain Assessment: Faces ?Faces Pain Scale: Hurts even more ?Pain Location: lower back with mobility ?Pain Descriptors / Indicators: Grimacing, Moaning, Guarding ?Pain Intervention(s): Monitored during session, Repositioned  ? ? ?Home Living   ?  ?  ?  ?  ?  ?  ?  ?  ?  ?   ?  ?Prior Function    ?  ?  ?   ? ?PT Goals (current goals can now be found in the care  plan section) Acute Rehab PT Goals ?Patient Stated Goal: to get stronger. Patient states that she does not feel she can go home at this time ?PT Goal Formulation: With patient ?Time For Goal Achievement: 01/17/22 ?Potential to Achieve Goals: Good ?Progress towards PT goals: Progressing toward goals ? ?  ?Frequency ? ? ? Min 2X/week ? ? ? ?  ?PT Plan Current plan remains appropriate  ? ? ?Co-evaluation   ?  ?  ?  ?  ? ?  ?AM-PAC PT "6 Clicks" Mobility   ?Outcome Measure ? Help needed turning from your back to your side while in a flat bed without using bedrails?: A Little ?Help needed moving from lying on your back to sitting on the side of a flat bed without using bedrails?: A Little ?Help needed moving to and from a bed to a chair (including a wheelchair)?: A Lot ?Help needed standing up from a chair using your arms (e.g., wheelchair or bedside chair)?: A Lot ?Help needed to walk in hospital room?: A Lot ?Help needed climbing 3-5 steps with a railing? : A Lot ?6 Click Score: 14 ? ?  ?End of Session Equipment Utilized During Treatment: Gait belt ?Activity Tolerance: Patient limited by fatigue ?Patient left: in bed;with call bell/phone within reach;with bed alarm set ?Nurse Communication: Mobility status ?PT Visit Diagnosis: Unsteadiness on feet (R26.81);Muscle weakness (generalized) (M62.81);Repeated falls (R29.6);Pain ?Pain - part of body:  (back) ?  ? ? ?Time: 1914-7829 ?PT Time Calculation (min) (ACUTE ONLY): 19 min ? ?Charges:  $Gait Training: 8-22 mins          ?          ? ?Amanda Cockayne, PT, GCS ?01/04/22,3:37 PM ? ?

## 2022-01-04 NOTE — Progress Notes (Signed)
[facility] Progress Note ? ?Patient: Margaret Francis    VZD:[id number]  DOA: 01/02/2022    ?Date of Service: the patient was seen and examined on 01/04/2022 ? ?Brief hospital course: ?88 year old female with past medical history of hypertension, hypothyroidism who presented to the emergency room on 5/15 with several days of nausea/vomiting and diarrhea and found to have sepsis secondary to enteritis caused by rotavirus.  Patient treated aggressively with IV fluids.  Seen by PT and OT who recommended skilled nursing and currently patient is waiting for bed. ? ?Assessment and Plan: ?Assessment and Plan: ?* Intestinal infection, enteritis due to rotavirus ?--rectal tube placed in the ED on presentation, presumably due to severe diarrhea.  Once diarrhea improved, rectal tube removed the following day. ?--received vanc cefepime and Flagyl in the ED but given positive rotavirus, abx were not continued. ?Symptoms have much improved. ? ?Sepsis (HCC)-resolved as of 01/04/2022 ?Secondary to rotavirus enteritis ?Sepsis criteria includes Tmax 102.8, tachycardia to 103 tachypnea of 24,   sepsis has since resolved. ? ? ?Transaminitis ?Mild.  Likely related to hypotension from sepsis.  Transaminases have started trending downward. ? ? ?Hyponatremia-resolved as of 01/04/2022 ?Hypovolemic related to diarrhea.  Resolved with IVF. ? ?Hypertension ?Initially systolic blood pressure soft due to sepsis, however pressure starting to trend up and likely will restart home losartan today. ? ?Thyroid disease ?Continue levothyroxine ? ?History of stroke ?Continue aspirin and statin ? ?Overweight (BMI 25.0-29.9) ?Patient meets criteria with BMI greater than 25. ? ?Frequent falls ?Weakness ?No evidence of acute injury on x-ray hip and CT head ?--Pt felt significantly weaker than baseline due to this episode of acute GI illness with constant diarrhea PTA, and as a result, had multiple falls at home. ?--PT/OT rec SNF rehab ? ? ? ? ? ? ?Body mass  index is 29.76 kg/m?.  ?  ?   ? ?Consultants: ?None ? ?Procedures: ?None ? ?Antimicrobials: ?IV cefepime, Flagyl and vancomycin x1 5/15 ? ?Code Status: DNR ? ? ?Subjective: Patient feeling better, tolerating p.o.  No nausea vomiting or diarrhea symptoms today. ? ?Objective: ?Vital signs were reviewed and unremarkable. ?Vitals:  ? 01/04/22 0416 01/04/22 0825  ?BP: 118/78 (!) 149/71  ?Pulse: 70 64  ?Resp: 16 17  ?Temp: (!) 97.5 ?F (36.4 ?C) 97.7 ?F (36.5 ?C)  ?SpO2: 98% 97%  ? ? ?Intake/Output Summary (Last 24 hours) at 01/04/2022 1342 ?Last data filed at 01/04/2022 0425 ?Gross per 24 hour  ?Intake --  ?Output 700 ml  ?Net -700 ml  ? ?Filed Weights  ? 01/02/22 1739  ?Weight: 86.2 kg  ? ?Body mass index is 29.76 kg/m?. ? ?Exam: ? ?General: Alert and oriented x3, no acute distress ?HEENT: Normocephalic atraumatic, mucous membranes are moist ?Cardiovascular: Regular rate and rhythm, S1-S2 ?Respiratory: Clear to auscultation bilaterally ?Abdomen: Soft, nontender, nondistended, positive bowel sounds ?Musculoskeletal: No clubbing or cyanosis or edema ?Skin: No skin breaks, tears or lesions ?Psychiatry: Appropriate, no evidence of psychoses ?Neurology: No focal deficits ? ?Data Reviewed: ?Improving transaminases ? ?Disposition:  ?Status is: Inpatient ?Remains inpatient appropriate because: Skilled nursing facility placement search ?  ? ?Anticipated discharge date: 5/18 or 5/19 ? ? ?Family Communication: Updated sister by phone. ?DVT Prophylaxis: ?enoxaparin (LOVENOX) injection 40 mg Start: 01/03/22 0800 ? ? ? ?Author: ?Annita Brod ,MD ?01/04/2022 1:42 PM ? ?To reach On-call, see care teams to locate the attending and reach out via [email or web address]. ?Between 7PM-7AM, please contact night-coverage ?If you still have difficulty reaching the attending provider, please  page the [facility] (Director on Call) for [facility] on amion for assistance. ? ?

## 2022-01-05 DIAGNOSIS — R296 Repeated falls: Secondary | ICD-10-CM | POA: Diagnosis not present

## 2022-01-05 DIAGNOSIS — R2689 Other abnormalities of gait and mobility: Secondary | ICD-10-CM | POA: Diagnosis not present

## 2022-01-05 DIAGNOSIS — M6281 Muscle weakness (generalized): Secondary | ICD-10-CM | POA: Diagnosis not present

## 2022-01-05 DIAGNOSIS — I10 Essential (primary) hypertension: Secondary | ICD-10-CM

## 2022-01-05 DIAGNOSIS — E039 Hypothyroidism, unspecified: Secondary | ICD-10-CM | POA: Diagnosis not present

## 2022-01-05 DIAGNOSIS — A080 Rotaviral enteritis: Secondary | ICD-10-CM | POA: Diagnosis not present

## 2022-01-05 DIAGNOSIS — E663 Overweight: Secondary | ICD-10-CM | POA: Diagnosis not present

## 2022-01-05 DIAGNOSIS — R29898 Other symptoms and signs involving the musculoskeletal system: Secondary | ICD-10-CM | POA: Diagnosis not present

## 2022-01-05 DIAGNOSIS — R5381 Other malaise: Secondary | ICD-10-CM | POA: Diagnosis not present

## 2022-01-05 DIAGNOSIS — Z7401 Bed confinement status: Secondary | ICD-10-CM | POA: Diagnosis not present

## 2022-01-05 DIAGNOSIS — E079 Disorder of thyroid, unspecified: Secondary | ICD-10-CM | POA: Diagnosis not present

## 2022-01-05 DIAGNOSIS — A419 Sepsis, unspecified organism: Secondary | ICD-10-CM | POA: Diagnosis not present

## 2022-01-05 DIAGNOSIS — A079 Protozoal intestinal disease, unspecified: Secondary | ICD-10-CM | POA: Diagnosis not present

## 2022-01-05 DIAGNOSIS — R6521 Severe sepsis with septic shock: Secondary | ICD-10-CM | POA: Diagnosis not present

## 2022-01-05 DIAGNOSIS — R6520 Severe sepsis without septic shock: Secondary | ICD-10-CM | POA: Diagnosis not present

## 2022-01-05 DIAGNOSIS — R278 Other lack of coordination: Secondary | ICD-10-CM | POA: Diagnosis not present

## 2022-01-05 DIAGNOSIS — R7401 Elevation of levels of liver transaminase levels: Secondary | ICD-10-CM | POA: Diagnosis not present

## 2022-01-05 LAB — BASIC METABOLIC PANEL
Anion gap: 8 (ref 5–15)
BUN: 12 mg/dL (ref 8–23)
CO2: 27 mmol/L (ref 22–32)
Calcium: 9.7 mg/dL (ref 8.9–10.3)
Chloride: 103 mmol/L (ref 98–111)
Creatinine, Ser: 0.54 mg/dL (ref 0.44–1.00)
GFR, Estimated: >60 mL/min (ref >60)
Glucose, Bld: 111 mg/dL — ABNORMAL HIGH (ref 70–99)
Potassium: 3.3 mmol/L — ABNORMAL LOW (ref 3.5–5.1)
Sodium: 138 mmol/L (ref 135–145)

## 2022-01-05 LAB — CBC
HCT: 34.6 % — ABNORMAL LOW (ref 36.0–46.0)
Hemoglobin: 11.5 g/dL — ABNORMAL LOW (ref 12.0–15.0)
MCH: 30.3 pg (ref 26.0–34.0)
MCHC: 33.2 g/dL (ref 30.0–36.0)
MCV: 91.3 fL (ref 80.0–100.0)
Platelets: 260 10*3/uL (ref 150–400)
RBC: 3.79 MIL/uL — ABNORMAL LOW (ref 3.87–5.11)
RDW: 12.7 % (ref 11.5–15.5)
WBC: 5.5 10*3/uL (ref 4.0–10.5)
nRBC: 0 % (ref 0.0–0.2)

## 2022-01-05 LAB — URINE CULTURE: Culture: 60000 — AB

## 2022-01-05 LAB — MAGNESIUM: Magnesium: 2 mg/dL (ref 1.7–2.4)

## 2022-01-05 MED ORDER — LOPERAMIDE HCL 2 MG PO CAPS: 2.0000 mg | ORAL_CAPSULE | ORAL | 0 refills | Status: DC | PRN

## 2022-01-05 MED ORDER — ONDANSETRON 4 MG PO TBDP: 4.0000 mg | ORAL_TABLET | Freq: Three times a day (TID) | ORAL | 0 refills | Status: DC | PRN

## 2022-01-05 NOTE — Progress Notes (Signed)
After use of commode- patient felt her knees buckle, patient lowered to the ground on her knees and then assisted to the bed with staff. Vital signs stable 160/91 BP, HR 84. MD and charge nurse notified for near fall. Patient did not hit her head. Patient is ok and states she has no pain.

## 2022-01-05 NOTE — Progress Notes (Signed)
Report attempted x2 to call to [facility]. Left phone number for them to call back for report.  PIV removed. Assisted patient with getting dressed.  No further needs. EMS to pick patient up.

## 2022-01-05 NOTE — Progress Notes (Signed)
Report given to Roshanda at [facility].

## 2022-01-05 NOTE — Care Management Important Message (Signed)
Important Message  Patient Details  Name: Margaret Francis MRN: [id number] Date of Birth: 1930-04-28   Medicare Important Message Given:  Yes     Juliann Pulse A Rita Prom 01/05/2022, 2:42 PM

## 2022-01-05 NOTE — Progress Notes (Signed)
Occupational Therapy Treatment Patient Details Name: Margaret Francis MRN: [id number] DOB: 1930-05-03 Today's Date: 01/05/2022   History of present illness Pt is a 88 year old female admitted with a several day history of nausea vomiting and diarrhea, generalized weakness with falls and now associated with fever, diagnosed with rotavirus.  PMH significant for Hypertension, prior stroke, hypothyroidism   OT comments  Chart reviewed to date, pt greeted in bed eager to tranfers to bedside chair. Tx session targeted progressing functional mobility, endurance, strength for improved and safe ADL completion. Improvements noted in mobility tolerance as pt is able to amb aprpox 8' with RW with CGA and chair follow before requiring rest break. Grooming tasks completed with set up. Pt reports she feels significantly weaker compared to baseline. Continue to recommend discharge to STR to address functional deficits. Pt is left in bedside chair,NAD, all neesd met. OT will follow acutely.    Recommendations for follow up therapy are one component of a multi-disciplinary discharge planning process, led by the attending physician.  Recommendations may be updated based on patient status, additional functional criteria and insurance authorization.    Follow Up Recommendations  Skilled nursing-short term rehab (<3 hours/day)    Assistance Recommended at Discharge Frequent or constant Supervision/Assistance  Patient can return home with the following  A little help with walking and/or transfers;A lot of help with bathing/dressing/bathroom;Assistance with cooking/housework;Assist for transportation;Help with stairs or ramp for entrance   Equipment Recommendations  Other (comment) (per next venue of care)    Recommendations for Other Services      Precautions / Restrictions Precautions Precautions: Fall Restrictions Weight Bearing Restrictions: No       Mobility Bed Mobility Overal bed mobility: Needs  Assistance Bed Mobility: Supine to Sit     Supine to sit: Supervision, HOB elevated     General bed mobility comments: intermitent vcs for technique    Transfers Overall transfer level: Needs assistance Equipment used: Rolling walker (2 wheels) Transfers: Sit to/from Stand Sit to Stand: Min assist, Min guard (MIN A 1x, CGA 2x)                 Balance Overall balance assessment: Needs assistance, History of Falls Sitting-balance support: Feet supported Sitting balance-Leahy Scale: Good     Standing balance support: Bilateral upper extremity supported, During functional activity, Reliant on assistive device for balance Standing balance-Leahy Scale: Poor                             ADL either performed or assessed with clinical judgement   ADL Overall ADL's : Needs assistance/impaired     Grooming: Oral care;Sitting;Set up                   Toilet Transfer: Min guard;Rolling walker (2 wheels) Toilet Transfer Details (indicate cue type and reason): simulated short amb transfer to bedside chair with RW         Functional mobility during ADLs: Min guard;Rolling walker (2 wheels) (approx 8' with chair follow before requiring rest break)      Extremity/Trunk Assessment              Vision       Perception     Praxis      Cognition Arousal/Alertness: Awake/alert Behavior During Therapy: WFL for tasks assessed/performed Overall Cognitive Status: Within Functional Limits for tasks assessed  Exercises      Shoulder Instructions       General Comments      Pertinent Vitals/ Pain       Pain Assessment Pain Assessment: No/denies pain  Home Living                                          Prior Functioning/Environment              Frequency  Min 2X/week        Progress Toward Goals  OT Goals(current goals can now be found in the care plan  section)  Progress towards OT goals: Progressing toward goals     Plan Discharge plan remains appropriate    Co-evaluation                 AM-PAC OT "6 Clicks" Daily Activity     Outcome Measure   Help from another person eating meals?: None Help from another person taking care of personal grooming?: None Help from another person toileting, which includes using toliet, bedpan, or urinal?: A Lot Help from another person bathing (including washing, rinsing, drying)?: A Lot Help from another person to put on and taking off regular upper body clothing?: A Little Help from another person to put on and taking off regular lower body clothing?: A Lot 6 Click Score: 17    End of Session Equipment Utilized During Treatment: Rolling walker (2 wheels)  OT Visit Diagnosis: History of falling (Z91.81);Muscle weakness (generalized) (M62.81)   Activity Tolerance Patient tolerated treatment well   Patient Left in chair;with call bell/phone within reach   Nurse Communication Mobility status        Time: 9047-5339 OT Time Calculation (min): 14 min  Charges: OT General Charges $OT Visit: 1 Visit OT Treatments $Self Care/Home Management : 8-22 mins  Shanon Payor, OTD OTR/L  01/05/22, 12:05 PM

## 2022-01-05 NOTE — Discharge Summary (Signed)
Physician Discharge Summary   Patient: Margaret Francis MRN: [id number] DOB: December 03, 1929  Admit date:     01/02/2022  Discharge date: 01/05/22  Discharge Physician: Annita Brod   PCP: Seward Carol, MD   Recommendations at discharge:   New medication: Imodium 2 mg p.o. as needed loose stool New medication: Zofran 4 mg p.o. every 6 hours as needed nausea  Discharge Diagnoses: Principal Problem:   Intestinal infection, enteritis due to rotavirus Active Problems:   Transaminitis   Hypertension   Thyroid disease   History of stroke   Overweight (BMI 25.0-29.9)   Frequent falls  Resolved Problems:   Sepsis ([place])   Hyponatremia  Hospital Course: 88 year old female with past medical history of hypertension, hypothyroidism who presented to the emergency room on 5/15 with several days of nausea/vomiting and diarrhea and found to have sepsis secondary to enteritis caused by rotavirus.  Patient treated aggressively with IV fluids.  Seen by PT and OT who recommended skilled nursing and accepted for skilled nursing on 5/18.  Assessment and Plan: * Intestinal infection, enteritis due to rotavirus --rectal tube placed in the ED on presentation, presumably due to severe diarrhea.  Once diarrhea improved, rectal tube removed the following day. --received vanc cefepime and Flagyl in the ED but given positive rotavirus, abx were not continued.  Symptoms slowly improving.  Sepsis (HCC)-resolved as of 01/04/2022 Secondary to rotavirus enteritis Sepsis criteria includes Tmax 102.8, tachycardia to 103 tachypnea of 24,   sepsis has since resolved.   Transaminitis Mild.  Likely related to hypotension from sepsis.  Transaminases have started trending downward.   Hyponatremia-resolved as of 01/04/2022 Hypovolemic related to diarrhea.  Resolved with IVF.  Hypertension Initially systolic blood pressure soft due to sepsis, however pressure starting to trend up and home Cozaar resumed  Thyroid  disease Continue levothyroxine  History of stroke Continue aspirin and statin  Overweight (BMI 25.0-29.9) Patient meets criteria with BMI greater than 25.  Frequent falls Weakness No evidence of acute injury on x-ray hip and CT head --Pt felt significantly weaker than baseline due to this episode of acute GI illness with constant diarrhea PTA, and as a result, had multiple falls at home. --PT/OT rec SNF rehab         Consultants: None Procedures performed: None Disposition: Skilled nursing facility Diet recommendation:  Discharge Diet Orders (From admission, onward)     Start     Ordered   01/05/22 0000  Diet - low sodium heart healthy        01/05/22 1340           Cardiac diet DISCHARGE MEDICATION: Allergies as of 01/05/2022       Reactions   Demerol Nausea And Vomiting   Morphine And Related Nausea And Vomiting        Medication List     TAKE these medications    aspirin 81 MG tablet Take 81 mg by mouth daily.   levothyroxine 50 MCG tablet Commonly known as: SYNTHROID Take 50 mcg by mouth daily.   loperamide 2 MG capsule Commonly known as: IMODIUM Take 1 capsule (2 mg total) by mouth as needed for diarrhea or loose stools.   losartan 100 MG tablet Commonly known as: COZAAR Take 100 mg by mouth daily.   mirabegron ER 50 MG Tb24 tablet Commonly known as: Myrbetriq Take 1 tablet (50 mg total) by mouth daily.   mirtazapine 15 MG tablet Commonly known as: REMERON Take 7.5 mg by mouth daily.  ondansetron 4 MG disintegrating tablet Commonly known as: ZOFRAN-ODT Take 1 tablet (4 mg total) by mouth every 8 (eight) hours as needed for nausea or vomiting.   sertraline 50 MG tablet Commonly known as: ZOLOFT 1 tablet        Contact information for after-discharge care     Destination     [facility]erred [facility] .   Service: Skilled Nursing Contact information: [address] [place]  [place] [phone]                    Discharge Exam: Danley Danker Weights   01/02/22 1739  Weight: 86.2 kg   General: Alert and oriented x3, no acute distress Cardiovascular: Regular rate and rhythm, S1-S2 Lungs: Clear auscultation bilaterally  Condition at discharge: improving  The results of significant diagnostics from this hospitalization (including imaging, microbiology, ancillary and laboratory) are listed below for reference.   Imaging Studies: CT ABDOMEN PELVIS WO CONTRAST  Result Date: 01/02/2022 CLINICAL DATA:  Altered mental status, nausea, vomiting, diarrhea x 6 days EXAM: CT ABDOMEN AND PELVIS WITHOUT CONTRAST TECHNIQUE: Multidetector CT imaging of the abdomen and pelvis was performed following the standard protocol without IV contrast. RADIATION DOSE REDUCTION: This exam was performed according to the departmental dose-optimization program which includes automated exposure control, adjustment of the mA and/or kV according to patient size and/or use of iterative reconstruction technique. COMPARISON:  None Available. FINDINGS: Lower chest: There are linear densities in the lower lung fields suggesting scarring or subsegmental atelectasis. Hepatobiliary: Liver measures 23.7 cm in length. No focal abnormality is seen. There is no dilation of bile ducts. Surgical clips are seen in gallbladder fossa. Pancreas: No focal abnormality is seen. Spleen: Unremarkable. Adrenals/Urinary Tract: Adrenals are unremarkable. There is no hydronephrosis. There are no renal or ureteral stones. Urinary bladder is almost completely empty limiting evaluation. Stomach/Bowel: Small hiatal hernia is seen. There is no significant dilation of small-bowel loops. Appendix is not seen. There is fluid in the lumen of colon and rectum. There is no significant wall thickening in colon. Scattered diverticula are seen in the colon without signs of focal acute diverticulitis. Vascular/Lymphatic: Scattered arterial  calcifications are seen. Reproductive: Uterus is not seen. Other: There is no ascites or pneumoperitoneum. There is 6.3 by 6 x 3.1 cm soft tissue density in the subcutaneous plane lateral to the left hip. There is mild skin thickening in the same region. Small bilateral inguinal hernias containing fat are noted. Musculoskeletal: There is decrease in height upper endplates of bodies of L1 and L3 vertebrae. Schmorl's node is seen in the upper endplate of body of L3 vertebra. Degenerative changes are noted in both hips, more so on the left side. IMPRESSION: There is no evidence of intestinal obstruction or pneumoperitoneum. There is no hydronephrosis. There is liquid stool in the lumen of colon and rectum suggesting possible nonspecific colitis. There is no significant wall thickening in colon. There is 6.3 x 6 x 3.1 cm soft tissue density structure with irregular margins in the subcutaneous plane lateral to the left hip along with skin thickening. This may suggest contusion and subcutaneous hematoma. Less likely possibility would be infectious or neoplastic process. There is no loculated fluid collection. There is decrease in height of upper endplates of bodies of L1 and L3 vertebrae suggesting recent or old compression fractures. Small hiatal hernia.  Enlarged liver.  Diverticulosis of colon. Other findings as described in the body of the report. Electronically Signed   By:  Elmer Picker M.D.   On: 01/02/2022 18:51   CT HEAD WO CONTRAST (5MM)  Result Date: 01/02/2022 CLINICAL DATA:  Altered mental status, nausea, vomiting, diarrhea x 6 days EXAM: CT HEAD WITHOUT CONTRAST TECHNIQUE: Contiguous axial images were obtained from the base of the skull through the vertex without intravenous contrast. RADIATION DOSE REDUCTION: This exam was performed according to the departmental dose-optimization program which includes automated exposure control, adjustment of the mA and/or kV according to patient size and/or use  of iterative reconstruction technique. COMPARISON:  03/10/2010 FINDINGS: Brain: There is patient motion in the some of the images limiting the study. There are no signs of bleeding within the cranium. There is dilation of third and both lateral ventricles out of proportion to cortical atrophy. There is decreased density in the periventricular white matter. There is no shift of midline structures. Basal ganglia calcifications are seen. Vascular: Extensive arterial calcifications are seen. Skull: Unremarkable. Sinuses/Orbits: Unremarkable. Other: None IMPRESSION: No acute intracranial findings are seen in noncontrast CT brain. There is dilation of third and both lateral ventricles out of proportion to cortical atrophy suggesting possible normal pressure hydrocephalus. Electronically Signed   By: Elmer Picker M.D.   On: 01/02/2022 18:38   DG Chest Port 1 View  Result Date: 01/02/2022 CLINICAL DATA:  Trauma, fall EXAM: PORTABLE CHEST 1 VIEW COMPARISON:  07/21/2009 FINDINGS: Cardiac size is within normal limits. Thoracic aorta is tortuous and ectatic. There are no signs of pulmonary edema. There is subtle increased density in the lateral aspect of right mid lung fields between the posterior aspect of right sixth and seventh ribs. There is increased distance between the posterior aspect of right sixth and seventh ribs which may be a congenital variation or residual change from previous intervention. Rest of the lung fields are unremarkable. There is no definite focal pulmonary consolidation. There is no pleural effusion or pneumothorax. IMPRESSION: There is subtle increased density in the lateral aspect of right mid lung fields which may suggest pleural thickening. Less likely possibility would be early pneumonia. Follow-up PA and lateral views of chest may be considered. There are no signs of pulmonary edema or definite focal pulmonary consolidation. There is no pleural effusion or pneumothorax. Electronically  Signed   By: Elmer Picker M.D.   On: 01/02/2022 20:05   DG Hip Unilat W or Wo Pelvis 2-3 Views Left  Result Date: 01/02/2022 CLINICAL DATA:  Fall, left hip pain EXAM: DG HIP (WITH OR WITHOUT PELVIS) 2-3V LEFT COMPARISON:  None FINDINGS: Degenerative changes in the hips with joint space narrowing and spurring, left greater than right. No acute bony abnormality. Specifically, no fracture, subluxation, or dislocation. IMPRESSION: No acute bony abnormality. Electronically Signed   By: Rolm Baptise M.D.   On: 01/02/2022 20:04    Microbiology: Results for orders placed or performed during the hospital encounter of 01/02/22  Resp Panel by RT-PCR (Flu A&B, Covid) Nasopharyngeal Swab     Status: None   Collection Time: 01/02/22  5:33 PM   Specimen: Nasopharyngeal Swab; Nasopharyngeal(NP) swabs in vial transport medium  Result Value Ref Range Status   SARS Coronavirus 2 by RT PCR NEGATIVE NEGATIVE Final    Comment: (NOTE) SARS-CoV-2 target nucleic acids are NOT DETECTED.  The SARS-CoV-2 RNA is generally detectable in upper respiratory specimens during the acute phase of infection. The lowest concentration of SARS-CoV-2 viral copies this assay can detect is 138 copies/mL. A negative result does not preclude SARS-Cov-2 infection and should not be used  as the sole basis for treatment or other patient management decisions. A negative result may occur with  improper specimen collection/handling, submission of specimen other than nasopharyngeal swab, presence of viral mutation(s) within the areas targeted by this assay, and inadequate number of viral copies(<138 copies/mL). A negative result must be combined with clinical observations, patient history, and epidemiological information. The expected result is Negative.  Fact Sheet for Patients:  EntrepreneurPulse.com.au  Fact Sheet for Healthcare Providers:  IncredibleEmployment.be  This test is no t yet  approved or cleared by the [place] FDA and  has been authorized for detection and/or diagnosis of SARS-CoV-2 by FDA under an Emergency Use Authorization (EUA). This EUA will remain  in effect (meaning this test can be used) for the duration of the COVID-19 declaration under Section 564(b)(1) of the Act, 21 U.S.C.section 360bbb-3(b)(1), unless the authorization is terminated  or revoked sooner.       Influenza A by PCR NEGATIVE NEGATIVE Final   Influenza B by PCR NEGATIVE NEGATIVE Final    Comment: (NOTE) The Xpert Xpress SARS-CoV-2/FLU/RSV plus assay is intended as an aid in the diagnosis of influenza from Nasopharyngeal swab specimens and should not be used as a sole basis for treatment. Nasal washings and aspirates are unacceptable for Xpert Xpress SARS-CoV-2/FLU/RSV testing.  Fact Sheet for Patients: EntrepreneurPulse.com.au  Fact Sheet for Healthcare Providers: IncredibleEmployment.be  This test is not yet approved or cleared by the [place] FDA and has been authorized for detection and/or diagnosis of SARS-CoV-2 by FDA under an Emergency Use Authorization (EUA). This EUA will remain in effect (meaning this test can be used) for the duration of the COVID-19 declaration under Section 564(b)(1) of the Act, 21 U.S.C. section 360bbb-3(b)(1), unless the authorization is terminated or revoked.  Performed at [facility], [address] [place], [place] [address]   Blood Culture (routine x 2)     Status: None (Preliminary result)   Collection Time: 01/02/22  5:33 PM   Specimen: BLOOD  Result Value Ref Range Status   Specimen Description BLOOD RIGHT ASSIST CONTROL  Final   Special Requests   Final    BOTTLES DRAWN AEROBIC AND ANAEROBIC Blood Culture results may not be optimal due to an excessive volume of blood received in culture bottles   Culture   Final    NO GROWTH 3 DAYS Performed at [facility], [address] [place], [place] [address]    Report Status PENDING  Incomplete  Blood Culture (routine x 2)     Status: None (Preliminary result)   Collection Time: 01/02/22  5:35 PM   Specimen: BLOOD  Result Value Ref Range Status   Specimen Description BLOOD LEFT FOREARM  Final   Special Requests   Final    BOTTLES DRAWN AEROBIC ONLY Blood Culture results may not be optimal due to an inadequate volume of blood received in culture bottles   Culture   Final    NO GROWTH 3 DAYS Performed at [facility], [address], [place], [place] [address]    Report Status PENDING  Incomplete  Urine Culture     Status: Abnormal   Collection Time: 01/02/22  5:52 PM   Specimen: Urine, Random  Result Value Ref Range Status   Specimen Description   Final    URINE, RANDOM Performed at [facility], [address] [place], [place] [address]    Special Requests   Final    NONE Performed at [facility], [place], [place] [address]    Culture (A)  Final    60,000 COLONIES/mL LACTOBACILLUS SPECIES Standardized susceptibility testing for this organism is not available. Performed at [facility], [place] [address] [place], [place] [address]    Report Status 01/05/2022 FINAL  Final  Gastrointestinal Panel by PCR , Stool     Status: Abnormal   Collection Time: 01/02/22  8:50 PM   Specimen: Stool  Result Value Ref Range Status   Campylobacter species NOT DETECTED NOT DETECTED Final   Plesimonas shigelloides NOT DETECTED NOT DETECTED Final   Salmonella species NOT DETECTED NOT DETECTED Final   Yersinia enterocolitica NOT DETECTED NOT DETECTED Final   Vibrio species NOT DETECTED NOT DETECTED Final   Vibrio cholerae NOT DETECTED NOT DETECTED Final   Enteroaggregative E coli (EAEC) NOT DETECTED NOT DETECTED Final   Enteropathogenic E coli (EPEC) NOT DETECTED NOT DETECTED Final   Enterotoxigenic E coli (ETEC) NOT DETECTED NOT DETECTED Final   Shiga like  toxin producing E coli (STEC) NOT DETECTED NOT DETECTED Final   Shigella/Enteroinvasive E coli (EIEC) NOT DETECTED NOT DETECTED Final   Cryptosporidium NOT DETECTED NOT DETECTED Final   Cyclospora cayetanensis NOT DETECTED NOT DETECTED Final   Entamoeba histolytica NOT DETECTED NOT DETECTED Final   Giardia lamblia NOT DETECTED NOT DETECTED Final   Adenovirus F40/41 NOT DETECTED NOT DETECTED Final   Astrovirus NOT DETECTED NOT DETECTED Final   Norovirus GI/GII NOT DETECTED NOT DETECTED Final   Rotavirus A DETECTED (A) NOT DETECTED Final   Sapovirus (I, II, IV, and V) NOT DETECTED NOT DETECTED Final    Comment: Performed at [facility], [address] [place], [place] [address]  C Difficile Quick Screen w PCR reflex     Status: None   Collection Time: 01/02/22  8:50 PM   Specimen: Stool  Result Value Ref Range Status   C Diff antigen NEGATIVE NEGATIVE Final   C Diff toxin NEGATIVE NEGATIVE Final   C Diff interpretation No C. difficile detected.  Final    Comment: Performed at [facility], [address] [place],  [address]    Labs: CBC: Recent Labs  Lab 01/02/22 1733 01/03/22 0403 01/04/22 0429 01/05/22 0441  WBC 9.8 9.8 4.4 5.5  NEUTROABS 9.2*  --   --   --   HGB 12.6 11.1* 11.4* 11.5*  HCT 38.2 33.1* 34.4* 34.6*  MCV 94.3 94.8 93.0 91.3  PLT 249 228 226 675   Basic Metabolic Panel: Recent Labs  Lab 01/02/22 1733 01/03/22 0403 01/04/22 0429 01/05/22 0441  NA 129* 135 140 138  K 3.2* 3.5 3.8 3.3*  CL 97* 106 106 103  CO2 '23 23 26 27  '$ GLUCOSE 157* 108* 117* 111*  BUN '17 14 13 12  '$ CREATININE 0.75 0.67 0.57 0.54  CALCIUM 9.7 9.3 9.9 9.7  MG  --   --  2.0 2.0   Liver Function Tests: Recent Labs  Lab 01/02/22 1733 01/03/22 0403 01/04/22 0429  AST 60* 70* 40  ALT 48* 63* 51*  ALKPHOS 139* 110 115  BILITOT 0.9 0.6 0.2*  PROT 6.7 5.5* 5.9*  ALBUMIN 3.6 2.9* 3.0*   CBG: No results for input(s): GLUCAP in the last 168  hours.  Discharge time spent: less than 30 minutes.  Signed: Annita Brod, MD [facility] 01/05/2022

## 2022-01-05 NOTE — TOC Transition Note (Signed)
Transition of Care [facility]) - CM/SW Discharge Note   Patient Details  Name: KAMORAH NEVILS MRN: [id number] Date of Birth: 03/18/1930  [facility] CM/SW Contact:  Shelbie Hutching, RN Phone Number: 01/05/2022, 12:18 PM   Clinical Narrative:    Patient is medically cleared for discharge today.  [facility] has offered a bed and they have a private room.  They have gotten insurance authorization.  Patient will be going to room 401P, bedside RN will call report to [phone].  RNCM will arrange EMS transport once discharge completed.  Nira Conn has been notified of discharge plan for today.     Final next level of care: Skilled Nursing Facility Barriers to Discharge: Barriers Resolved   Patient Goals and CMS Choice Patient states their goals for this hospitalization and ongoing recovery are:: Agrees to [employer].gov Compare Post Acute Care list provided to:: Patient Choice offered to / list presented to : Patient, [facility] POA / [place]  Discharge Placement              Patient [facility] bed at: [facility] Patient to be transferred to facility by: [facility] Name of family member notified: Nira Conn Patient and family notified of of transfer: 01/05/22  Discharge Plan and Services                DME Arranged: N/A DME Agency: NA       HH Arranged: NA HH Agency: NA        Social Determinants of Health (SDOH) Interventions     Readmission Risk Interventions     View : No data to display.

## 2022-01-07 LAB — CULTURE, BLOOD (ROUTINE X 2)
Culture: NO GROWTH
Culture: NO GROWTH

## 2022-01-09 DIAGNOSIS — M6281 Muscle weakness (generalized): Secondary | ICD-10-CM | POA: Diagnosis not present

## 2022-01-09 DIAGNOSIS — I10 Essential (primary) hypertension: Secondary | ICD-10-CM | POA: Diagnosis not present

## 2022-01-09 DIAGNOSIS — A080 Rotaviral enteritis: Secondary | ICD-10-CM | POA: Diagnosis not present

## 2022-01-09 DIAGNOSIS — E039 Hypothyroidism, unspecified: Secondary | ICD-10-CM | POA: Diagnosis not present

## 2022-01-09 DIAGNOSIS — R7401 Elevation of levels of liver transaminase levels: Secondary | ICD-10-CM | POA: Diagnosis not present

## 2022-01-09 DIAGNOSIS — R296 Repeated falls: Secondary | ICD-10-CM | POA: Diagnosis not present

## 2022-01-09 DIAGNOSIS — R6521 Severe sepsis with septic shock: Secondary | ICD-10-CM | POA: Diagnosis not present

## 2022-01-10 DIAGNOSIS — I10 Essential (primary) hypertension: Secondary | ICD-10-CM | POA: Diagnosis not present

## 2022-01-10 DIAGNOSIS — E079 Disorder of thyroid, unspecified: Secondary | ICD-10-CM | POA: Diagnosis not present

## 2022-01-10 DIAGNOSIS — R6521 Severe sepsis with septic shock: Secondary | ICD-10-CM | POA: Diagnosis not present

## 2022-01-10 DIAGNOSIS — A080 Rotaviral enteritis: Secondary | ICD-10-CM | POA: Diagnosis not present

## 2022-01-10 DIAGNOSIS — R296 Repeated falls: Secondary | ICD-10-CM | POA: Diagnosis not present

## 2022-01-10 DIAGNOSIS — E039 Hypothyroidism, unspecified: Secondary | ICD-10-CM | POA: Diagnosis not present

## 2022-01-10 DIAGNOSIS — R7401 Elevation of levels of liver transaminase levels: Secondary | ICD-10-CM | POA: Diagnosis not present

## 2022-01-10 DIAGNOSIS — M6281 Muscle weakness (generalized): Secondary | ICD-10-CM | POA: Diagnosis not present

## 2022-01-11 ENCOUNTER — Other Ambulatory Visit: Payer: Self-pay | Admitting: *Deleted

## 2022-01-11 DIAGNOSIS — R6521 Severe sepsis with septic shock: Secondary | ICD-10-CM | POA: Diagnosis not present

## 2022-01-11 DIAGNOSIS — A079 Protozoal intestinal disease, unspecified: Secondary | ICD-10-CM | POA: Diagnosis not present

## 2022-01-11 DIAGNOSIS — M6281 Muscle weakness (generalized): Secondary | ICD-10-CM | POA: Diagnosis not present

## 2022-01-11 DIAGNOSIS — A080 Rotaviral enteritis: Secondary | ICD-10-CM | POA: Diagnosis not present

## 2022-01-11 DIAGNOSIS — E039 Hypothyroidism, unspecified: Secondary | ICD-10-CM | POA: Diagnosis not present

## 2022-01-11 DIAGNOSIS — R296 Repeated falls: Secondary | ICD-10-CM | POA: Diagnosis not present

## 2022-01-11 DIAGNOSIS — R7401 Elevation of levels of liver transaminase levels: Secondary | ICD-10-CM | POA: Diagnosis not present

## 2022-01-11 DIAGNOSIS — I10 Essential (primary) hypertension: Secondary | ICD-10-CM | POA: Diagnosis not present

## 2022-01-11 NOTE — Patient Outreach (Signed)
Per [place] eligible member resides in [facility]. Screened for potential post SNF care coordination needs.   Member's PCP at [facility] [place] has Upstream care management services available.    Secure communication sent to facility SW to make aware writer is following for transition plans and care coordination needs.  Will continue to follow while member resides in SNF.     Marthenia Rolling, MSN, RN,BSN [place] Acute Care Coordinator [phone] [facility]) [phone]  (Toll free office)

## 2022-01-20 ENCOUNTER — Other Ambulatory Visit: Payer: Self-pay | Admitting: *Deleted

## 2022-01-20 DIAGNOSIS — E039 Hypothyroidism, unspecified: Secondary | ICD-10-CM | POA: Diagnosis not present

## 2022-01-20 DIAGNOSIS — I10 Essential (primary) hypertension: Secondary | ICD-10-CM | POA: Diagnosis not present

## 2022-01-20 DIAGNOSIS — R296 Repeated falls: Secondary | ICD-10-CM | POA: Diagnosis not present

## 2022-01-20 DIAGNOSIS — R7401 Elevation of levels of liver transaminase levels: Secondary | ICD-10-CM | POA: Diagnosis not present

## 2022-01-20 DIAGNOSIS — R6521 Severe sepsis with septic shock: Secondary | ICD-10-CM | POA: Diagnosis not present

## 2022-01-20 DIAGNOSIS — A080 Rotaviral enteritis: Secondary | ICD-10-CM | POA: Diagnosis not present

## 2022-01-20 NOTE — Patient Outreach (Signed)
[facility] Post- Acute Care Coordinator follow up. Member screened for potential [facility] services as a benefit of Mrs. Forgione's insurance plan.   Verified in [facility] that Mrs. Wahlquist transitioned from [facility] on 01/20/22.   Member's PCP at [facility] [place] has Upstream care management services.   Marthenia Rolling, MSN, RN,BSN [place] Acute Care Coordinator [phone] [facility]) [phone]  (Toll free office)

## 2022-01-24 DIAGNOSIS — Z8673 Personal history of transient ischemic attack (TIA), and cerebral infarction without residual deficits: Secondary | ICD-10-CM | POA: Diagnosis not present

## 2022-01-24 DIAGNOSIS — I10 Essential (primary) hypertension: Secondary | ICD-10-CM | POA: Diagnosis not present

## 2022-01-24 DIAGNOSIS — M6281 Muscle weakness (generalized): Secondary | ICD-10-CM | POA: Diagnosis not present

## 2022-01-24 DIAGNOSIS — R296 Repeated falls: Secondary | ICD-10-CM | POA: Diagnosis not present

## 2022-01-24 DIAGNOSIS — M21371 Foot drop, right foot: Secondary | ICD-10-CM | POA: Diagnosis not present

## 2022-01-24 DIAGNOSIS — K219 Gastro-esophageal reflux disease without esophagitis: Secondary | ICD-10-CM | POA: Diagnosis not present

## 2022-01-24 DIAGNOSIS — G629 Polyneuropathy, unspecified: Secondary | ICD-10-CM | POA: Diagnosis not present

## 2022-01-24 DIAGNOSIS — M21372 Foot drop, left foot: Secondary | ICD-10-CM | POA: Diagnosis not present

## 2022-01-24 DIAGNOSIS — R350 Frequency of micturition: Secondary | ICD-10-CM | POA: Diagnosis not present

## 2022-01-24 DIAGNOSIS — M8580 Other specified disorders of bone density and structure, unspecified site: Secondary | ICD-10-CM | POA: Diagnosis not present

## 2022-01-24 DIAGNOSIS — A080 Rotaviral enteritis: Secondary | ICD-10-CM | POA: Diagnosis not present

## 2022-01-24 DIAGNOSIS — E039 Hypothyroidism, unspecified: Secondary | ICD-10-CM | POA: Diagnosis not present

## 2022-01-25 DIAGNOSIS — M6281 Muscle weakness (generalized): Secondary | ICD-10-CM | POA: Diagnosis not present

## 2022-01-25 DIAGNOSIS — Z8673 Personal history of transient ischemic attack (TIA), and cerebral infarction without residual deficits: Secondary | ICD-10-CM | POA: Diagnosis not present

## 2022-01-25 DIAGNOSIS — M8580 Other specified disorders of bone density and structure, unspecified site: Secondary | ICD-10-CM | POA: Diagnosis not present

## 2022-01-25 DIAGNOSIS — M21371 Foot drop, right foot: Secondary | ICD-10-CM | POA: Diagnosis not present

## 2022-01-25 DIAGNOSIS — K219 Gastro-esophageal reflux disease without esophagitis: Secondary | ICD-10-CM | POA: Diagnosis not present

## 2022-01-25 DIAGNOSIS — G629 Polyneuropathy, unspecified: Secondary | ICD-10-CM | POA: Diagnosis not present

## 2022-01-25 DIAGNOSIS — M21372 Foot drop, left foot: Secondary | ICD-10-CM | POA: Diagnosis not present

## 2022-01-25 DIAGNOSIS — I10 Essential (primary) hypertension: Secondary | ICD-10-CM | POA: Diagnosis not present

## 2022-01-25 DIAGNOSIS — A080 Rotaviral enteritis: Secondary | ICD-10-CM | POA: Diagnosis not present

## 2022-01-25 DIAGNOSIS — R296 Repeated falls: Secondary | ICD-10-CM | POA: Diagnosis not present

## 2022-01-25 DIAGNOSIS — R350 Frequency of micturition: Secondary | ICD-10-CM | POA: Diagnosis not present

## 2022-01-25 DIAGNOSIS — E039 Hypothyroidism, unspecified: Secondary | ICD-10-CM | POA: Diagnosis not present

## 2022-01-26 DIAGNOSIS — K219 Gastro-esophageal reflux disease without esophagitis: Secondary | ICD-10-CM | POA: Diagnosis not present

## 2022-01-26 DIAGNOSIS — M8580 Other specified disorders of bone density and structure, unspecified site: Secondary | ICD-10-CM | POA: Diagnosis not present

## 2022-01-26 DIAGNOSIS — R350 Frequency of micturition: Secondary | ICD-10-CM | POA: Diagnosis not present

## 2022-01-26 DIAGNOSIS — I10 Essential (primary) hypertension: Secondary | ICD-10-CM | POA: Diagnosis not present

## 2022-01-26 DIAGNOSIS — R269 Unspecified abnormalities of gait and mobility: Secondary | ICD-10-CM | POA: Diagnosis not present

## 2022-01-26 DIAGNOSIS — E039 Hypothyroidism, unspecified: Secondary | ICD-10-CM | POA: Diagnosis not present

## 2022-01-26 DIAGNOSIS — R296 Repeated falls: Secondary | ICD-10-CM | POA: Diagnosis not present

## 2022-01-26 DIAGNOSIS — G629 Polyneuropathy, unspecified: Secondary | ICD-10-CM | POA: Diagnosis not present

## 2022-01-26 DIAGNOSIS — Z8673 Personal history of transient ischemic attack (TIA), and cerebral infarction without residual deficits: Secondary | ICD-10-CM | POA: Diagnosis not present

## 2022-01-26 DIAGNOSIS — M21371 Foot drop, right foot: Secondary | ICD-10-CM | POA: Diagnosis not present

## 2022-01-26 DIAGNOSIS — K5901 Slow transit constipation: Secondary | ICD-10-CM | POA: Diagnosis not present

## 2022-01-26 DIAGNOSIS — A080 Rotaviral enteritis: Secondary | ICD-10-CM | POA: Diagnosis not present

## 2022-01-26 DIAGNOSIS — M6281 Muscle weakness (generalized): Secondary | ICD-10-CM | POA: Diagnosis not present

## 2022-01-26 DIAGNOSIS — M21372 Foot drop, left foot: Secondary | ICD-10-CM | POA: Diagnosis not present

## 2022-01-30 DIAGNOSIS — R296 Repeated falls: Secondary | ICD-10-CM | POA: Diagnosis not present

## 2022-01-30 DIAGNOSIS — M6281 Muscle weakness (generalized): Secondary | ICD-10-CM | POA: Diagnosis not present

## 2022-01-30 DIAGNOSIS — Z8673 Personal history of transient ischemic attack (TIA), and cerebral infarction without residual deficits: Secondary | ICD-10-CM | POA: Diagnosis not present

## 2022-01-30 DIAGNOSIS — G629 Polyneuropathy, unspecified: Secondary | ICD-10-CM | POA: Diagnosis not present

## 2022-01-30 DIAGNOSIS — K219 Gastro-esophageal reflux disease without esophagitis: Secondary | ICD-10-CM | POA: Diagnosis not present

## 2022-01-30 DIAGNOSIS — M8580 Other specified disorders of bone density and structure, unspecified site: Secondary | ICD-10-CM | POA: Diagnosis not present

## 2022-01-30 DIAGNOSIS — M21372 Foot drop, left foot: Secondary | ICD-10-CM | POA: Diagnosis not present

## 2022-01-30 DIAGNOSIS — E039 Hypothyroidism, unspecified: Secondary | ICD-10-CM | POA: Diagnosis not present

## 2022-01-30 DIAGNOSIS — A080 Rotaviral enteritis: Secondary | ICD-10-CM | POA: Diagnosis not present

## 2022-01-30 DIAGNOSIS — R350 Frequency of micturition: Secondary | ICD-10-CM | POA: Diagnosis not present

## 2022-01-30 DIAGNOSIS — I10 Essential (primary) hypertension: Secondary | ICD-10-CM | POA: Diagnosis not present

## 2022-01-30 DIAGNOSIS — M21371 Foot drop, right foot: Secondary | ICD-10-CM | POA: Diagnosis not present

## 2022-02-01 DIAGNOSIS — A080 Rotaviral enteritis: Secondary | ICD-10-CM | POA: Diagnosis not present

## 2022-02-01 DIAGNOSIS — R296 Repeated falls: Secondary | ICD-10-CM | POA: Diagnosis not present

## 2022-02-01 DIAGNOSIS — E039 Hypothyroidism, unspecified: Secondary | ICD-10-CM | POA: Diagnosis not present

## 2022-02-01 DIAGNOSIS — Z8673 Personal history of transient ischemic attack (TIA), and cerebral infarction without residual deficits: Secondary | ICD-10-CM | POA: Diagnosis not present

## 2022-02-01 DIAGNOSIS — R350 Frequency of micturition: Secondary | ICD-10-CM | POA: Diagnosis not present

## 2022-02-01 DIAGNOSIS — I10 Essential (primary) hypertension: Secondary | ICD-10-CM | POA: Diagnosis not present

## 2022-02-01 DIAGNOSIS — K219 Gastro-esophageal reflux disease without esophagitis: Secondary | ICD-10-CM | POA: Diagnosis not present

## 2022-02-01 DIAGNOSIS — M6281 Muscle weakness (generalized): Secondary | ICD-10-CM | POA: Diagnosis not present

## 2022-02-01 DIAGNOSIS — M21371 Foot drop, right foot: Secondary | ICD-10-CM | POA: Diagnosis not present

## 2022-02-01 DIAGNOSIS — M8580 Other specified disorders of bone density and structure, unspecified site: Secondary | ICD-10-CM | POA: Diagnosis not present

## 2022-02-01 DIAGNOSIS — G629 Polyneuropathy, unspecified: Secondary | ICD-10-CM | POA: Diagnosis not present

## 2022-02-01 DIAGNOSIS — M21372 Foot drop, left foot: Secondary | ICD-10-CM | POA: Diagnosis not present

## 2022-02-02 DIAGNOSIS — A080 Rotaviral enteritis: Secondary | ICD-10-CM | POA: Diagnosis not present

## 2022-02-02 DIAGNOSIS — K219 Gastro-esophageal reflux disease without esophagitis: Secondary | ICD-10-CM | POA: Diagnosis not present

## 2022-02-02 DIAGNOSIS — G629 Polyneuropathy, unspecified: Secondary | ICD-10-CM | POA: Diagnosis not present

## 2022-02-02 DIAGNOSIS — R296 Repeated falls: Secondary | ICD-10-CM | POA: Diagnosis not present

## 2022-02-02 DIAGNOSIS — Z8673 Personal history of transient ischemic attack (TIA), and cerebral infarction without residual deficits: Secondary | ICD-10-CM | POA: Diagnosis not present

## 2022-02-02 DIAGNOSIS — R350 Frequency of micturition: Secondary | ICD-10-CM | POA: Diagnosis not present

## 2022-02-02 DIAGNOSIS — E039 Hypothyroidism, unspecified: Secondary | ICD-10-CM | POA: Diagnosis not present

## 2022-02-02 DIAGNOSIS — M8580 Other specified disorders of bone density and structure, unspecified site: Secondary | ICD-10-CM | POA: Diagnosis not present

## 2022-02-02 DIAGNOSIS — I10 Essential (primary) hypertension: Secondary | ICD-10-CM | POA: Diagnosis not present

## 2022-02-02 DIAGNOSIS — M21372 Foot drop, left foot: Secondary | ICD-10-CM | POA: Diagnosis not present

## 2022-02-02 DIAGNOSIS — M21371 Foot drop, right foot: Secondary | ICD-10-CM | POA: Diagnosis not present

## 2022-02-02 DIAGNOSIS — M6281 Muscle weakness (generalized): Secondary | ICD-10-CM | POA: Diagnosis not present

## 2022-02-03 DIAGNOSIS — M8580 Other specified disorders of bone density and structure, unspecified site: Secondary | ICD-10-CM | POA: Diagnosis not present

## 2022-02-03 DIAGNOSIS — M21371 Foot drop, right foot: Secondary | ICD-10-CM | POA: Diagnosis not present

## 2022-02-03 DIAGNOSIS — M21372 Foot drop, left foot: Secondary | ICD-10-CM | POA: Diagnosis not present

## 2022-02-03 DIAGNOSIS — R296 Repeated falls: Secondary | ICD-10-CM | POA: Diagnosis not present

## 2022-02-03 DIAGNOSIS — G629 Polyneuropathy, unspecified: Secondary | ICD-10-CM | POA: Diagnosis not present

## 2022-02-03 DIAGNOSIS — E039 Hypothyroidism, unspecified: Secondary | ICD-10-CM | POA: Diagnosis not present

## 2022-02-03 DIAGNOSIS — K219 Gastro-esophageal reflux disease without esophagitis: Secondary | ICD-10-CM | POA: Diagnosis not present

## 2022-02-03 DIAGNOSIS — I10 Essential (primary) hypertension: Secondary | ICD-10-CM | POA: Diagnosis not present

## 2022-02-03 DIAGNOSIS — R350 Frequency of micturition: Secondary | ICD-10-CM | POA: Diagnosis not present

## 2022-02-03 DIAGNOSIS — M6281 Muscle weakness (generalized): Secondary | ICD-10-CM | POA: Diagnosis not present

## 2022-02-03 DIAGNOSIS — Z8673 Personal history of transient ischemic attack (TIA), and cerebral infarction without residual deficits: Secondary | ICD-10-CM | POA: Diagnosis not present

## 2022-02-03 DIAGNOSIS — A080 Rotaviral enteritis: Secondary | ICD-10-CM | POA: Diagnosis not present

## 2022-02-06 DIAGNOSIS — E039 Hypothyroidism, unspecified: Secondary | ICD-10-CM | POA: Diagnosis not present

## 2022-02-06 DIAGNOSIS — R296 Repeated falls: Secondary | ICD-10-CM | POA: Diagnosis not present

## 2022-02-06 DIAGNOSIS — I10 Essential (primary) hypertension: Secondary | ICD-10-CM | POA: Diagnosis not present

## 2022-02-06 DIAGNOSIS — G629 Polyneuropathy, unspecified: Secondary | ICD-10-CM | POA: Diagnosis not present

## 2022-02-06 DIAGNOSIS — R350 Frequency of micturition: Secondary | ICD-10-CM | POA: Diagnosis not present

## 2022-02-06 DIAGNOSIS — A080 Rotaviral enteritis: Secondary | ICD-10-CM | POA: Diagnosis not present

## 2022-02-06 DIAGNOSIS — M21371 Foot drop, right foot: Secondary | ICD-10-CM | POA: Diagnosis not present

## 2022-02-06 DIAGNOSIS — M6281 Muscle weakness (generalized): Secondary | ICD-10-CM | POA: Diagnosis not present

## 2022-02-06 DIAGNOSIS — M21372 Foot drop, left foot: Secondary | ICD-10-CM | POA: Diagnosis not present

## 2022-02-06 DIAGNOSIS — Z8673 Personal history of transient ischemic attack (TIA), and cerebral infarction without residual deficits: Secondary | ICD-10-CM | POA: Diagnosis not present

## 2022-02-06 DIAGNOSIS — M8580 Other specified disorders of bone density and structure, unspecified site: Secondary | ICD-10-CM | POA: Diagnosis not present

## 2022-02-06 DIAGNOSIS — K219 Gastro-esophageal reflux disease without esophagitis: Secondary | ICD-10-CM | POA: Diagnosis not present

## 2022-02-07 DIAGNOSIS — I10 Essential (primary) hypertension: Secondary | ICD-10-CM | POA: Diagnosis not present

## 2022-02-07 DIAGNOSIS — M21372 Foot drop, left foot: Secondary | ICD-10-CM | POA: Diagnosis not present

## 2022-02-07 DIAGNOSIS — R296 Repeated falls: Secondary | ICD-10-CM | POA: Diagnosis not present

## 2022-02-07 DIAGNOSIS — G629 Polyneuropathy, unspecified: Secondary | ICD-10-CM | POA: Diagnosis not present

## 2022-02-07 DIAGNOSIS — M8580 Other specified disorders of bone density and structure, unspecified site: Secondary | ICD-10-CM | POA: Diagnosis not present

## 2022-02-07 DIAGNOSIS — Z8673 Personal history of transient ischemic attack (TIA), and cerebral infarction without residual deficits: Secondary | ICD-10-CM | POA: Diagnosis not present

## 2022-02-07 DIAGNOSIS — K219 Gastro-esophageal reflux disease without esophagitis: Secondary | ICD-10-CM | POA: Diagnosis not present

## 2022-02-07 DIAGNOSIS — R350 Frequency of micturition: Secondary | ICD-10-CM | POA: Diagnosis not present

## 2022-02-07 DIAGNOSIS — M6281 Muscle weakness (generalized): Secondary | ICD-10-CM | POA: Diagnosis not present

## 2022-02-07 DIAGNOSIS — M21371 Foot drop, right foot: Secondary | ICD-10-CM | POA: Diagnosis not present

## 2022-02-07 DIAGNOSIS — E039 Hypothyroidism, unspecified: Secondary | ICD-10-CM | POA: Diagnosis not present

## 2022-02-07 DIAGNOSIS — A080 Rotaviral enteritis: Secondary | ICD-10-CM | POA: Diagnosis not present

## 2022-02-09 DIAGNOSIS — K5901 Slow transit constipation: Secondary | ICD-10-CM | POA: Diagnosis not present

## 2022-02-09 DIAGNOSIS — I10 Essential (primary) hypertension: Secondary | ICD-10-CM | POA: Diagnosis not present

## 2022-02-09 DIAGNOSIS — R269 Unspecified abnormalities of gait and mobility: Secondary | ICD-10-CM | POA: Diagnosis not present

## 2022-02-10 DIAGNOSIS — K219 Gastro-esophageal reflux disease without esophagitis: Secondary | ICD-10-CM | POA: Diagnosis not present

## 2022-02-10 DIAGNOSIS — M8580 Other specified disorders of bone density and structure, unspecified site: Secondary | ICD-10-CM | POA: Diagnosis not present

## 2022-02-10 DIAGNOSIS — R296 Repeated falls: Secondary | ICD-10-CM | POA: Diagnosis not present

## 2022-02-10 DIAGNOSIS — M21371 Foot drop, right foot: Secondary | ICD-10-CM | POA: Diagnosis not present

## 2022-02-10 DIAGNOSIS — G629 Polyneuropathy, unspecified: Secondary | ICD-10-CM | POA: Diagnosis not present

## 2022-02-10 DIAGNOSIS — R350 Frequency of micturition: Secondary | ICD-10-CM | POA: Diagnosis not present

## 2022-02-10 DIAGNOSIS — I10 Essential (primary) hypertension: Secondary | ICD-10-CM | POA: Diagnosis not present

## 2022-02-10 DIAGNOSIS — Z8673 Personal history of transient ischemic attack (TIA), and cerebral infarction without residual deficits: Secondary | ICD-10-CM | POA: Diagnosis not present

## 2022-02-10 DIAGNOSIS — M6281 Muscle weakness (generalized): Secondary | ICD-10-CM | POA: Diagnosis not present

## 2022-02-10 DIAGNOSIS — M21372 Foot drop, left foot: Secondary | ICD-10-CM | POA: Diagnosis not present

## 2022-02-10 DIAGNOSIS — E039 Hypothyroidism, unspecified: Secondary | ICD-10-CM | POA: Diagnosis not present

## 2022-02-10 DIAGNOSIS — A080 Rotaviral enteritis: Secondary | ICD-10-CM | POA: Diagnosis not present

## 2022-02-13 DIAGNOSIS — M21371 Foot drop, right foot: Secondary | ICD-10-CM | POA: Diagnosis not present

## 2022-02-13 DIAGNOSIS — R350 Frequency of micturition: Secondary | ICD-10-CM | POA: Diagnosis not present

## 2022-02-13 DIAGNOSIS — M6281 Muscle weakness (generalized): Secondary | ICD-10-CM | POA: Diagnosis not present

## 2022-02-13 DIAGNOSIS — G629 Polyneuropathy, unspecified: Secondary | ICD-10-CM | POA: Diagnosis not present

## 2022-02-13 DIAGNOSIS — A080 Rotaviral enteritis: Secondary | ICD-10-CM | POA: Diagnosis not present

## 2022-02-13 DIAGNOSIS — E039 Hypothyroidism, unspecified: Secondary | ICD-10-CM | POA: Diagnosis not present

## 2022-02-13 DIAGNOSIS — M21372 Foot drop, left foot: Secondary | ICD-10-CM | POA: Diagnosis not present

## 2022-02-13 DIAGNOSIS — I10 Essential (primary) hypertension: Secondary | ICD-10-CM | POA: Diagnosis not present

## 2022-02-13 DIAGNOSIS — R296 Repeated falls: Secondary | ICD-10-CM | POA: Diagnosis not present

## 2022-02-13 DIAGNOSIS — M8580 Other specified disorders of bone density and structure, unspecified site: Secondary | ICD-10-CM | POA: Diagnosis not present

## 2022-02-13 DIAGNOSIS — Z8673 Personal history of transient ischemic attack (TIA), and cerebral infarction without residual deficits: Secondary | ICD-10-CM | POA: Diagnosis not present

## 2022-02-13 DIAGNOSIS — K219 Gastro-esophageal reflux disease without esophagitis: Secondary | ICD-10-CM | POA: Diagnosis not present

## 2022-02-15 DIAGNOSIS — Z8673 Personal history of transient ischemic attack (TIA), and cerebral infarction without residual deficits: Secondary | ICD-10-CM | POA: Diagnosis not present

## 2022-02-15 DIAGNOSIS — M21372 Foot drop, left foot: Secondary | ICD-10-CM | POA: Diagnosis not present

## 2022-02-15 DIAGNOSIS — R350 Frequency of micturition: Secondary | ICD-10-CM | POA: Diagnosis not present

## 2022-02-15 DIAGNOSIS — R296 Repeated falls: Secondary | ICD-10-CM | POA: Diagnosis not present

## 2022-02-15 DIAGNOSIS — G629 Polyneuropathy, unspecified: Secondary | ICD-10-CM | POA: Diagnosis not present

## 2022-02-15 DIAGNOSIS — A080 Rotaviral enteritis: Secondary | ICD-10-CM | POA: Diagnosis not present

## 2022-02-15 DIAGNOSIS — K219 Gastro-esophageal reflux disease without esophagitis: Secondary | ICD-10-CM | POA: Diagnosis not present

## 2022-02-15 DIAGNOSIS — M21371 Foot drop, right foot: Secondary | ICD-10-CM | POA: Diagnosis not present

## 2022-02-15 DIAGNOSIS — M8580 Other specified disorders of bone density and structure, unspecified site: Secondary | ICD-10-CM | POA: Diagnosis not present

## 2022-02-15 DIAGNOSIS — M6281 Muscle weakness (generalized): Secondary | ICD-10-CM | POA: Diagnosis not present

## 2022-02-15 DIAGNOSIS — E039 Hypothyroidism, unspecified: Secondary | ICD-10-CM | POA: Diagnosis not present

## 2022-02-15 DIAGNOSIS — I10 Essential (primary) hypertension: Secondary | ICD-10-CM | POA: Diagnosis not present

## 2022-02-16 DIAGNOSIS — M21371 Foot drop, right foot: Secondary | ICD-10-CM | POA: Diagnosis not present

## 2022-02-16 DIAGNOSIS — R350 Frequency of micturition: Secondary | ICD-10-CM | POA: Diagnosis not present

## 2022-02-16 DIAGNOSIS — Z8673 Personal history of transient ischemic attack (TIA), and cerebral infarction without residual deficits: Secondary | ICD-10-CM | POA: Diagnosis not present

## 2022-02-16 DIAGNOSIS — E039 Hypothyroidism, unspecified: Secondary | ICD-10-CM | POA: Diagnosis not present

## 2022-02-16 DIAGNOSIS — M21372 Foot drop, left foot: Secondary | ICD-10-CM | POA: Diagnosis not present

## 2022-02-16 DIAGNOSIS — K219 Gastro-esophageal reflux disease without esophagitis: Secondary | ICD-10-CM | POA: Diagnosis not present

## 2022-02-16 DIAGNOSIS — R296 Repeated falls: Secondary | ICD-10-CM | POA: Diagnosis not present

## 2022-02-16 DIAGNOSIS — G629 Polyneuropathy, unspecified: Secondary | ICD-10-CM | POA: Diagnosis not present

## 2022-02-16 DIAGNOSIS — I10 Essential (primary) hypertension: Secondary | ICD-10-CM | POA: Diagnosis not present

## 2022-02-16 DIAGNOSIS — M8580 Other specified disorders of bone density and structure, unspecified site: Secondary | ICD-10-CM | POA: Diagnosis not present

## 2022-02-16 DIAGNOSIS — M6281 Muscle weakness (generalized): Secondary | ICD-10-CM | POA: Diagnosis not present

## 2022-02-16 DIAGNOSIS — A080 Rotaviral enteritis: Secondary | ICD-10-CM | POA: Diagnosis not present

## 2022-02-21 DIAGNOSIS — R296 Repeated falls: Secondary | ICD-10-CM | POA: Diagnosis not present

## 2022-02-21 DIAGNOSIS — M21371 Foot drop, right foot: Secondary | ICD-10-CM | POA: Diagnosis not present

## 2022-02-21 DIAGNOSIS — G629 Polyneuropathy, unspecified: Secondary | ICD-10-CM | POA: Diagnosis not present

## 2022-02-21 DIAGNOSIS — Z8673 Personal history of transient ischemic attack (TIA), and cerebral infarction without residual deficits: Secondary | ICD-10-CM | POA: Diagnosis not present

## 2022-02-21 DIAGNOSIS — M21372 Foot drop, left foot: Secondary | ICD-10-CM | POA: Diagnosis not present

## 2022-02-21 DIAGNOSIS — M8580 Other specified disorders of bone density and structure, unspecified site: Secondary | ICD-10-CM | POA: Diagnosis not present

## 2022-02-21 DIAGNOSIS — E039 Hypothyroidism, unspecified: Secondary | ICD-10-CM | POA: Diagnosis not present

## 2022-02-21 DIAGNOSIS — R350 Frequency of micturition: Secondary | ICD-10-CM | POA: Diagnosis not present

## 2022-02-21 DIAGNOSIS — A080 Rotaviral enteritis: Secondary | ICD-10-CM | POA: Diagnosis not present

## 2022-02-21 DIAGNOSIS — K219 Gastro-esophageal reflux disease without esophagitis: Secondary | ICD-10-CM | POA: Diagnosis not present

## 2022-02-21 DIAGNOSIS — M6281 Muscle weakness (generalized): Secondary | ICD-10-CM | POA: Diagnosis not present

## 2022-02-21 DIAGNOSIS — I10 Essential (primary) hypertension: Secondary | ICD-10-CM | POA: Diagnosis not present

## 2022-02-23 DIAGNOSIS — R296 Repeated falls: Secondary | ICD-10-CM | POA: Diagnosis not present

## 2022-02-23 DIAGNOSIS — R350 Frequency of micturition: Secondary | ICD-10-CM | POA: Diagnosis not present

## 2022-02-23 DIAGNOSIS — M8580 Other specified disorders of bone density and structure, unspecified site: Secondary | ICD-10-CM | POA: Diagnosis not present

## 2022-02-23 DIAGNOSIS — I129 Hypertensive chronic kidney disease with stage 1 through stage 4 chronic kidney disease, or unspecified chronic kidney disease: Secondary | ICD-10-CM | POA: Diagnosis not present

## 2022-02-23 DIAGNOSIS — N182 Chronic kidney disease, stage 2 (mild): Secondary | ICD-10-CM | POA: Diagnosis not present

## 2022-02-23 DIAGNOSIS — E039 Hypothyroidism, unspecified: Secondary | ICD-10-CM | POA: Diagnosis not present

## 2022-02-23 DIAGNOSIS — I10 Essential (primary) hypertension: Secondary | ICD-10-CM | POA: Diagnosis not present

## 2022-02-23 DIAGNOSIS — A080 Rotaviral enteritis: Secondary | ICD-10-CM | POA: Diagnosis not present

## 2022-02-23 DIAGNOSIS — G629 Polyneuropathy, unspecified: Secondary | ICD-10-CM | POA: Diagnosis not present

## 2022-02-23 DIAGNOSIS — K5901 Slow transit constipation: Secondary | ICD-10-CM | POA: Diagnosis not present

## 2022-02-23 DIAGNOSIS — R269 Unspecified abnormalities of gait and mobility: Secondary | ICD-10-CM | POA: Diagnosis not present

## 2022-02-23 DIAGNOSIS — W010XXA Fall on same level from slipping, tripping and stumbling without subsequent striking against object, initial encounter: Secondary | ICD-10-CM | POA: Diagnosis not present

## 2022-02-23 DIAGNOSIS — Z8673 Personal history of transient ischemic attack (TIA), and cerebral infarction without residual deficits: Secondary | ICD-10-CM | POA: Diagnosis not present

## 2022-02-23 DIAGNOSIS — M21371 Foot drop, right foot: Secondary | ICD-10-CM | POA: Diagnosis not present

## 2022-02-23 DIAGNOSIS — M6281 Muscle weakness (generalized): Secondary | ICD-10-CM | POA: Diagnosis not present

## 2022-02-23 DIAGNOSIS — M21372 Foot drop, left foot: Secondary | ICD-10-CM | POA: Diagnosis not present

## 2022-02-23 DIAGNOSIS — K219 Gastro-esophageal reflux disease without esophagitis: Secondary | ICD-10-CM | POA: Diagnosis not present

## 2022-02-27 DIAGNOSIS — K219 Gastro-esophageal reflux disease without esophagitis: Secondary | ICD-10-CM | POA: Diagnosis not present

## 2022-02-27 DIAGNOSIS — A080 Rotaviral enteritis: Secondary | ICD-10-CM | POA: Diagnosis not present

## 2022-02-27 DIAGNOSIS — E039 Hypothyroidism, unspecified: Secondary | ICD-10-CM | POA: Diagnosis not present

## 2022-02-27 DIAGNOSIS — G629 Polyneuropathy, unspecified: Secondary | ICD-10-CM | POA: Diagnosis not present

## 2022-02-27 DIAGNOSIS — Z8673 Personal history of transient ischemic attack (TIA), and cerebral infarction without residual deficits: Secondary | ICD-10-CM | POA: Diagnosis not present

## 2022-02-27 DIAGNOSIS — M8580 Other specified disorders of bone density and structure, unspecified site: Secondary | ICD-10-CM | POA: Diagnosis not present

## 2022-02-27 DIAGNOSIS — I10 Essential (primary) hypertension: Secondary | ICD-10-CM | POA: Diagnosis not present

## 2022-02-27 DIAGNOSIS — R350 Frequency of micturition: Secondary | ICD-10-CM | POA: Diagnosis not present

## 2022-02-27 DIAGNOSIS — M21372 Foot drop, left foot: Secondary | ICD-10-CM | POA: Diagnosis not present

## 2022-02-27 DIAGNOSIS — M6281 Muscle weakness (generalized): Secondary | ICD-10-CM | POA: Diagnosis not present

## 2022-02-27 DIAGNOSIS — M21371 Foot drop, right foot: Secondary | ICD-10-CM | POA: Diagnosis not present

## 2022-02-27 DIAGNOSIS — R296 Repeated falls: Secondary | ICD-10-CM | POA: Diagnosis not present

## 2022-03-02 DIAGNOSIS — N182 Chronic kidney disease, stage 2 (mild): Secondary | ICD-10-CM | POA: Diagnosis not present

## 2022-03-02 DIAGNOSIS — I129 Hypertensive chronic kidney disease with stage 1 through stage 4 chronic kidney disease, or unspecified chronic kidney disease: Secondary | ICD-10-CM | POA: Diagnosis not present

## 2022-03-03 DIAGNOSIS — E559 Vitamin D deficiency, unspecified: Secondary | ICD-10-CM | POA: Diagnosis not present

## 2022-03-03 DIAGNOSIS — Z79899 Other long term (current) drug therapy: Secondary | ICD-10-CM | POA: Diagnosis not present

## 2022-03-08 DIAGNOSIS — M21371 Foot drop, right foot: Secondary | ICD-10-CM | POA: Diagnosis not present

## 2022-03-08 DIAGNOSIS — M21372 Foot drop, left foot: Secondary | ICD-10-CM | POA: Diagnosis not present

## 2022-03-08 DIAGNOSIS — Z8673 Personal history of transient ischemic attack (TIA), and cerebral infarction without residual deficits: Secondary | ICD-10-CM | POA: Diagnosis not present

## 2022-03-08 DIAGNOSIS — R296 Repeated falls: Secondary | ICD-10-CM | POA: Diagnosis not present

## 2022-03-08 DIAGNOSIS — G629 Polyneuropathy, unspecified: Secondary | ICD-10-CM | POA: Diagnosis not present

## 2022-03-08 DIAGNOSIS — E039 Hypothyroidism, unspecified: Secondary | ICD-10-CM | POA: Diagnosis not present

## 2022-03-08 DIAGNOSIS — R350 Frequency of micturition: Secondary | ICD-10-CM | POA: Diagnosis not present

## 2022-03-08 DIAGNOSIS — K219 Gastro-esophageal reflux disease without esophagitis: Secondary | ICD-10-CM | POA: Diagnosis not present

## 2022-03-08 DIAGNOSIS — E1142 Type 2 diabetes mellitus with diabetic polyneuropathy: Secondary | ICD-10-CM | POA: Diagnosis not present

## 2022-03-08 DIAGNOSIS — M8580 Other specified disorders of bone density and structure, unspecified site: Secondary | ICD-10-CM | POA: Diagnosis not present

## 2022-03-08 DIAGNOSIS — M6281 Muscle weakness (generalized): Secondary | ICD-10-CM | POA: Diagnosis not present

## 2022-03-08 DIAGNOSIS — A080 Rotaviral enteritis: Secondary | ICD-10-CM | POA: Diagnosis not present

## 2022-03-08 DIAGNOSIS — I10 Essential (primary) hypertension: Secondary | ICD-10-CM | POA: Diagnosis not present

## 2022-03-08 DIAGNOSIS — E782 Mixed hyperlipidemia: Secondary | ICD-10-CM | POA: Diagnosis not present

## 2022-03-09 DIAGNOSIS — I129 Hypertensive chronic kidney disease with stage 1 through stage 4 chronic kidney disease, or unspecified chronic kidney disease: Secondary | ICD-10-CM | POA: Diagnosis not present

## 2022-03-09 DIAGNOSIS — R296 Repeated falls: Secondary | ICD-10-CM | POA: Diagnosis not present

## 2022-03-09 DIAGNOSIS — M21371 Foot drop, right foot: Secondary | ICD-10-CM | POA: Diagnosis not present

## 2022-03-09 DIAGNOSIS — R350 Frequency of micturition: Secondary | ICD-10-CM | POA: Diagnosis not present

## 2022-03-09 DIAGNOSIS — E039 Hypothyroidism, unspecified: Secondary | ICD-10-CM | POA: Diagnosis not present

## 2022-03-09 DIAGNOSIS — M8580 Other specified disorders of bone density and structure, unspecified site: Secondary | ICD-10-CM | POA: Diagnosis not present

## 2022-03-09 DIAGNOSIS — Z8673 Personal history of transient ischemic attack (TIA), and cerebral infarction without residual deficits: Secondary | ICD-10-CM | POA: Diagnosis not present

## 2022-03-09 DIAGNOSIS — N182 Chronic kidney disease, stage 2 (mild): Secondary | ICD-10-CM | POA: Diagnosis not present

## 2022-03-09 DIAGNOSIS — I10 Essential (primary) hypertension: Secondary | ICD-10-CM | POA: Diagnosis not present

## 2022-03-09 DIAGNOSIS — A080 Rotaviral enteritis: Secondary | ICD-10-CM | POA: Diagnosis not present

## 2022-03-09 DIAGNOSIS — M6281 Muscle weakness (generalized): Secondary | ICD-10-CM | POA: Diagnosis not present

## 2022-03-09 DIAGNOSIS — G629 Polyneuropathy, unspecified: Secondary | ICD-10-CM | POA: Diagnosis not present

## 2022-03-09 DIAGNOSIS — K219 Gastro-esophageal reflux disease without esophagitis: Secondary | ICD-10-CM | POA: Diagnosis not present

## 2022-03-09 DIAGNOSIS — M21372 Foot drop, left foot: Secondary | ICD-10-CM | POA: Diagnosis not present

## 2022-03-13 DIAGNOSIS — Z8673 Personal history of transient ischemic attack (TIA), and cerebral infarction without residual deficits: Secondary | ICD-10-CM | POA: Diagnosis not present

## 2022-03-13 DIAGNOSIS — K219 Gastro-esophageal reflux disease without esophagitis: Secondary | ICD-10-CM | POA: Diagnosis not present

## 2022-03-13 DIAGNOSIS — M21371 Foot drop, right foot: Secondary | ICD-10-CM | POA: Diagnosis not present

## 2022-03-13 DIAGNOSIS — R350 Frequency of micturition: Secondary | ICD-10-CM | POA: Diagnosis not present

## 2022-03-13 DIAGNOSIS — E039 Hypothyroidism, unspecified: Secondary | ICD-10-CM | POA: Diagnosis not present

## 2022-03-13 DIAGNOSIS — A080 Rotaviral enteritis: Secondary | ICD-10-CM | POA: Diagnosis not present

## 2022-03-13 DIAGNOSIS — G629 Polyneuropathy, unspecified: Secondary | ICD-10-CM | POA: Diagnosis not present

## 2022-03-13 DIAGNOSIS — M21372 Foot drop, left foot: Secondary | ICD-10-CM | POA: Diagnosis not present

## 2022-03-13 DIAGNOSIS — M8580 Other specified disorders of bone density and structure, unspecified site: Secondary | ICD-10-CM | POA: Diagnosis not present

## 2022-03-13 DIAGNOSIS — R296 Repeated falls: Secondary | ICD-10-CM | POA: Diagnosis not present

## 2022-03-13 DIAGNOSIS — I10 Essential (primary) hypertension: Secondary | ICD-10-CM | POA: Diagnosis not present

## 2022-03-13 DIAGNOSIS — M6281 Muscle weakness (generalized): Secondary | ICD-10-CM | POA: Diagnosis not present

## 2022-03-15 DIAGNOSIS — M8580 Other specified disorders of bone density and structure, unspecified site: Secondary | ICD-10-CM | POA: Diagnosis not present

## 2022-03-15 DIAGNOSIS — M21372 Foot drop, left foot: Secondary | ICD-10-CM | POA: Diagnosis not present

## 2022-03-15 DIAGNOSIS — Z8673 Personal history of transient ischemic attack (TIA), and cerebral infarction without residual deficits: Secondary | ICD-10-CM | POA: Diagnosis not present

## 2022-03-15 DIAGNOSIS — I10 Essential (primary) hypertension: Secondary | ICD-10-CM | POA: Diagnosis not present

## 2022-03-15 DIAGNOSIS — R350 Frequency of micturition: Secondary | ICD-10-CM | POA: Diagnosis not present

## 2022-03-15 DIAGNOSIS — A080 Rotaviral enteritis: Secondary | ICD-10-CM | POA: Diagnosis not present

## 2022-03-15 DIAGNOSIS — K219 Gastro-esophageal reflux disease without esophagitis: Secondary | ICD-10-CM | POA: Diagnosis not present

## 2022-03-15 DIAGNOSIS — M21371 Foot drop, right foot: Secondary | ICD-10-CM | POA: Diagnosis not present

## 2022-03-15 DIAGNOSIS — E039 Hypothyroidism, unspecified: Secondary | ICD-10-CM | POA: Diagnosis not present

## 2022-03-15 DIAGNOSIS — M6281 Muscle weakness (generalized): Secondary | ICD-10-CM | POA: Diagnosis not present

## 2022-03-15 DIAGNOSIS — R296 Repeated falls: Secondary | ICD-10-CM | POA: Diagnosis not present

## 2022-03-15 DIAGNOSIS — G629 Polyneuropathy, unspecified: Secondary | ICD-10-CM | POA: Diagnosis not present

## 2022-03-22 DIAGNOSIS — M21371 Foot drop, right foot: Secondary | ICD-10-CM | POA: Diagnosis not present

## 2022-03-22 DIAGNOSIS — R296 Repeated falls: Secondary | ICD-10-CM | POA: Diagnosis not present

## 2022-03-22 DIAGNOSIS — M21372 Foot drop, left foot: Secondary | ICD-10-CM | POA: Diagnosis not present

## 2022-03-22 DIAGNOSIS — I10 Essential (primary) hypertension: Secondary | ICD-10-CM | POA: Diagnosis not present

## 2022-03-22 DIAGNOSIS — Z8673 Personal history of transient ischemic attack (TIA), and cerebral infarction without residual deficits: Secondary | ICD-10-CM | POA: Diagnosis not present

## 2022-03-22 DIAGNOSIS — G629 Polyneuropathy, unspecified: Secondary | ICD-10-CM | POA: Diagnosis not present

## 2022-03-22 DIAGNOSIS — K219 Gastro-esophageal reflux disease without esophagitis: Secondary | ICD-10-CM | POA: Diagnosis not present

## 2022-03-22 DIAGNOSIS — A080 Rotaviral enteritis: Secondary | ICD-10-CM | POA: Diagnosis not present

## 2022-03-22 DIAGNOSIS — R350 Frequency of micturition: Secondary | ICD-10-CM | POA: Diagnosis not present

## 2022-03-22 DIAGNOSIS — E039 Hypothyroidism, unspecified: Secondary | ICD-10-CM | POA: Diagnosis not present

## 2022-03-22 DIAGNOSIS — M8580 Other specified disorders of bone density and structure, unspecified site: Secondary | ICD-10-CM | POA: Diagnosis not present

## 2022-03-22 DIAGNOSIS — M6281 Muscle weakness (generalized): Secondary | ICD-10-CM | POA: Diagnosis not present

## 2022-03-23 DIAGNOSIS — R296 Repeated falls: Secondary | ICD-10-CM | POA: Diagnosis not present

## 2022-03-23 DIAGNOSIS — E559 Vitamin D deficiency, unspecified: Secondary | ICD-10-CM | POA: Diagnosis not present

## 2022-03-23 DIAGNOSIS — E039 Hypothyroidism, unspecified: Secondary | ICD-10-CM | POA: Diagnosis not present

## 2022-03-23 DIAGNOSIS — E1122 Type 2 diabetes mellitus with diabetic chronic kidney disease: Secondary | ICD-10-CM | POA: Diagnosis not present

## 2022-03-23 DIAGNOSIS — I129 Hypertensive chronic kidney disease with stage 1 through stage 4 chronic kidney disease, or unspecified chronic kidney disease: Secondary | ICD-10-CM | POA: Diagnosis not present

## 2022-03-23 DIAGNOSIS — N182 Chronic kidney disease, stage 2 (mild): Secondary | ICD-10-CM | POA: Diagnosis not present

## 2022-03-23 DIAGNOSIS — S81811A Laceration without foreign body, right lower leg, initial encounter: Secondary | ICD-10-CM | POA: Diagnosis not present

## 2022-03-23 DIAGNOSIS — R749 Abnormal serum enzyme level, unspecified: Secondary | ICD-10-CM | POA: Diagnosis not present

## 2022-03-23 DIAGNOSIS — E782 Mixed hyperlipidemia: Secondary | ICD-10-CM | POA: Diagnosis not present

## 2022-03-23 DIAGNOSIS — E1142 Type 2 diabetes mellitus with diabetic polyneuropathy: Secondary | ICD-10-CM | POA: Diagnosis not present

## 2022-03-23 DIAGNOSIS — K5901 Slow transit constipation: Secondary | ICD-10-CM | POA: Diagnosis not present

## 2022-03-23 DIAGNOSIS — I7091 Generalized atherosclerosis: Secondary | ICD-10-CM | POA: Diagnosis not present

## 2022-03-27 DIAGNOSIS — E1142 Type 2 diabetes mellitus with diabetic polyneuropathy: Secondary | ICD-10-CM | POA: Diagnosis not present

## 2022-03-27 DIAGNOSIS — S81811D Laceration without foreign body, right lower leg, subsequent encounter: Secondary | ICD-10-CM | POA: Diagnosis not present

## 2022-03-27 DIAGNOSIS — I129 Hypertensive chronic kidney disease with stage 1 through stage 4 chronic kidney disease, or unspecified chronic kidney disease: Secondary | ICD-10-CM | POA: Diagnosis not present

## 2022-03-27 DIAGNOSIS — E782 Mixed hyperlipidemia: Secondary | ICD-10-CM | POA: Diagnosis not present

## 2022-03-27 DIAGNOSIS — R296 Repeated falls: Secondary | ICD-10-CM | POA: Diagnosis not present

## 2022-03-27 DIAGNOSIS — E1122 Type 2 diabetes mellitus with diabetic chronic kidney disease: Secondary | ICD-10-CM | POA: Diagnosis not present

## 2022-03-27 DIAGNOSIS — N182 Chronic kidney disease, stage 2 (mild): Secondary | ICD-10-CM | POA: Diagnosis not present

## 2022-03-27 DIAGNOSIS — E039 Hypothyroidism, unspecified: Secondary | ICD-10-CM | POA: Diagnosis not present

## 2022-04-03 DIAGNOSIS — N182 Chronic kidney disease, stage 2 (mild): Secondary | ICD-10-CM | POA: Diagnosis not present

## 2022-04-03 DIAGNOSIS — E1122 Type 2 diabetes mellitus with diabetic chronic kidney disease: Secondary | ICD-10-CM | POA: Diagnosis not present

## 2022-04-03 DIAGNOSIS — E1142 Type 2 diabetes mellitus with diabetic polyneuropathy: Secondary | ICD-10-CM | POA: Diagnosis not present

## 2022-04-03 DIAGNOSIS — I129 Hypertensive chronic kidney disease with stage 1 through stage 4 chronic kidney disease, or unspecified chronic kidney disease: Secondary | ICD-10-CM | POA: Diagnosis not present

## 2022-04-03 DIAGNOSIS — S81811D Laceration without foreign body, right lower leg, subsequent encounter: Secondary | ICD-10-CM | POA: Diagnosis not present

## 2022-04-03 DIAGNOSIS — E782 Mixed hyperlipidemia: Secondary | ICD-10-CM | POA: Diagnosis not present

## 2022-04-03 DIAGNOSIS — R296 Repeated falls: Secondary | ICD-10-CM | POA: Diagnosis not present

## 2022-04-03 DIAGNOSIS — E039 Hypothyroidism, unspecified: Secondary | ICD-10-CM | POA: Diagnosis not present

## 2022-04-05 DIAGNOSIS — E782 Mixed hyperlipidemia: Secondary | ICD-10-CM | POA: Diagnosis not present

## 2022-04-05 DIAGNOSIS — I129 Hypertensive chronic kidney disease with stage 1 through stage 4 chronic kidney disease, or unspecified chronic kidney disease: Secondary | ICD-10-CM | POA: Diagnosis not present

## 2022-04-05 DIAGNOSIS — E039 Hypothyroidism, unspecified: Secondary | ICD-10-CM | POA: Diagnosis not present

## 2022-04-05 DIAGNOSIS — R296 Repeated falls: Secondary | ICD-10-CM | POA: Diagnosis not present

## 2022-04-05 DIAGNOSIS — E1122 Type 2 diabetes mellitus with diabetic chronic kidney disease: Secondary | ICD-10-CM | POA: Diagnosis not present

## 2022-04-05 DIAGNOSIS — N182 Chronic kidney disease, stage 2 (mild): Secondary | ICD-10-CM | POA: Diagnosis not present

## 2022-04-05 DIAGNOSIS — S81811D Laceration without foreign body, right lower leg, subsequent encounter: Secondary | ICD-10-CM | POA: Diagnosis not present

## 2022-04-05 DIAGNOSIS — E1142 Type 2 diabetes mellitus with diabetic polyneuropathy: Secondary | ICD-10-CM | POA: Diagnosis not present

## 2022-04-06 DIAGNOSIS — W010XXA Fall on same level from slipping, tripping and stumbling without subsequent striking against object, initial encounter: Secondary | ICD-10-CM | POA: Diagnosis not present

## 2022-04-06 DIAGNOSIS — R296 Repeated falls: Secondary | ICD-10-CM | POA: Diagnosis not present

## 2022-04-06 DIAGNOSIS — S81811D Laceration without foreign body, right lower leg, subsequent encounter: Secondary | ICD-10-CM | POA: Diagnosis not present

## 2022-04-06 DIAGNOSIS — R269 Unspecified abnormalities of gait and mobility: Secondary | ICD-10-CM | POA: Diagnosis not present

## 2022-04-10 DIAGNOSIS — E039 Hypothyroidism, unspecified: Secondary | ICD-10-CM | POA: Diagnosis not present

## 2022-04-10 DIAGNOSIS — E1122 Type 2 diabetes mellitus with diabetic chronic kidney disease: Secondary | ICD-10-CM | POA: Diagnosis not present

## 2022-04-10 DIAGNOSIS — N182 Chronic kidney disease, stage 2 (mild): Secondary | ICD-10-CM | POA: Diagnosis not present

## 2022-04-10 DIAGNOSIS — S81811D Laceration without foreign body, right lower leg, subsequent encounter: Secondary | ICD-10-CM | POA: Diagnosis not present

## 2022-04-10 DIAGNOSIS — E1142 Type 2 diabetes mellitus with diabetic polyneuropathy: Secondary | ICD-10-CM | POA: Diagnosis not present

## 2022-04-10 DIAGNOSIS — R296 Repeated falls: Secondary | ICD-10-CM | POA: Diagnosis not present

## 2022-04-10 DIAGNOSIS — I129 Hypertensive chronic kidney disease with stage 1 through stage 4 chronic kidney disease, or unspecified chronic kidney disease: Secondary | ICD-10-CM | POA: Diagnosis not present

## 2022-04-10 DIAGNOSIS — E782 Mixed hyperlipidemia: Secondary | ICD-10-CM | POA: Diagnosis not present

## 2022-04-14 DIAGNOSIS — R296 Repeated falls: Secondary | ICD-10-CM | POA: Diagnosis not present

## 2022-04-14 DIAGNOSIS — I129 Hypertensive chronic kidney disease with stage 1 through stage 4 chronic kidney disease, or unspecified chronic kidney disease: Secondary | ICD-10-CM | POA: Diagnosis not present

## 2022-04-14 DIAGNOSIS — E1122 Type 2 diabetes mellitus with diabetic chronic kidney disease: Secondary | ICD-10-CM | POA: Diagnosis not present

## 2022-04-14 DIAGNOSIS — S81811D Laceration without foreign body, right lower leg, subsequent encounter: Secondary | ICD-10-CM | POA: Diagnosis not present

## 2022-04-14 DIAGNOSIS — E039 Hypothyroidism, unspecified: Secondary | ICD-10-CM | POA: Diagnosis not present

## 2022-04-14 DIAGNOSIS — E1142 Type 2 diabetes mellitus with diabetic polyneuropathy: Secondary | ICD-10-CM | POA: Diagnosis not present

## 2022-04-14 DIAGNOSIS — N182 Chronic kidney disease, stage 2 (mild): Secondary | ICD-10-CM | POA: Diagnosis not present

## 2022-04-14 DIAGNOSIS — E782 Mixed hyperlipidemia: Secondary | ICD-10-CM | POA: Diagnosis not present

## 2022-04-15 DIAGNOSIS — N182 Chronic kidney disease, stage 2 (mild): Secondary | ICD-10-CM | POA: Diagnosis not present

## 2022-04-15 DIAGNOSIS — I129 Hypertensive chronic kidney disease with stage 1 through stage 4 chronic kidney disease, or unspecified chronic kidney disease: Secondary | ICD-10-CM | POA: Diagnosis not present

## 2022-04-15 DIAGNOSIS — S81811D Laceration without foreign body, right lower leg, subsequent encounter: Secondary | ICD-10-CM | POA: Diagnosis not present

## 2022-04-15 DIAGNOSIS — E039 Hypothyroidism, unspecified: Secondary | ICD-10-CM | POA: Diagnosis not present

## 2022-04-15 DIAGNOSIS — E1122 Type 2 diabetes mellitus with diabetic chronic kidney disease: Secondary | ICD-10-CM | POA: Diagnosis not present

## 2022-04-15 DIAGNOSIS — E1142 Type 2 diabetes mellitus with diabetic polyneuropathy: Secondary | ICD-10-CM | POA: Diagnosis not present

## 2022-04-15 DIAGNOSIS — R296 Repeated falls: Secondary | ICD-10-CM | POA: Diagnosis not present

## 2022-04-15 DIAGNOSIS — E782 Mixed hyperlipidemia: Secondary | ICD-10-CM | POA: Diagnosis not present

## 2022-04-17 DIAGNOSIS — E782 Mixed hyperlipidemia: Secondary | ICD-10-CM | POA: Diagnosis not present

## 2022-04-17 DIAGNOSIS — E1142 Type 2 diabetes mellitus with diabetic polyneuropathy: Secondary | ICD-10-CM | POA: Diagnosis not present

## 2022-04-17 DIAGNOSIS — E039 Hypothyroidism, unspecified: Secondary | ICD-10-CM | POA: Diagnosis not present

## 2022-04-17 DIAGNOSIS — I129 Hypertensive chronic kidney disease with stage 1 through stage 4 chronic kidney disease, or unspecified chronic kidney disease: Secondary | ICD-10-CM | POA: Diagnosis not present

## 2022-04-17 DIAGNOSIS — N182 Chronic kidney disease, stage 2 (mild): Secondary | ICD-10-CM | POA: Diagnosis not present

## 2022-04-17 DIAGNOSIS — R296 Repeated falls: Secondary | ICD-10-CM | POA: Diagnosis not present

## 2022-04-17 DIAGNOSIS — S81811D Laceration without foreign body, right lower leg, subsequent encounter: Secondary | ICD-10-CM | POA: Diagnosis not present

## 2022-04-17 DIAGNOSIS — E1122 Type 2 diabetes mellitus with diabetic chronic kidney disease: Secondary | ICD-10-CM | POA: Diagnosis not present

## 2022-04-19 DIAGNOSIS — I739 Peripheral vascular disease, unspecified: Secondary | ICD-10-CM | POA: Diagnosis not present

## 2022-04-19 DIAGNOSIS — G629 Polyneuropathy, unspecified: Secondary | ICD-10-CM | POA: Diagnosis not present

## 2022-04-20 DIAGNOSIS — I129 Hypertensive chronic kidney disease with stage 1 through stage 4 chronic kidney disease, or unspecified chronic kidney disease: Secondary | ICD-10-CM | POA: Diagnosis not present

## 2022-04-20 DIAGNOSIS — I739 Peripheral vascular disease, unspecified: Secondary | ICD-10-CM | POA: Diagnosis not present

## 2022-04-20 DIAGNOSIS — R269 Unspecified abnormalities of gait and mobility: Secondary | ICD-10-CM | POA: Diagnosis not present

## 2022-04-20 DIAGNOSIS — N182 Chronic kidney disease, stage 2 (mild): Secondary | ICD-10-CM | POA: Diagnosis not present

## 2022-04-20 DIAGNOSIS — G629 Polyneuropathy, unspecified: Secondary | ICD-10-CM | POA: Diagnosis not present

## 2022-04-20 DIAGNOSIS — R296 Repeated falls: Secondary | ICD-10-CM | POA: Diagnosis not present

## 2022-04-26 DIAGNOSIS — S81811D Laceration without foreign body, right lower leg, subsequent encounter: Secondary | ICD-10-CM | POA: Diagnosis not present

## 2022-04-26 DIAGNOSIS — I129 Hypertensive chronic kidney disease with stage 1 through stage 4 chronic kidney disease, or unspecified chronic kidney disease: Secondary | ICD-10-CM | POA: Diagnosis not present

## 2022-04-26 DIAGNOSIS — E1142 Type 2 diabetes mellitus with diabetic polyneuropathy: Secondary | ICD-10-CM | POA: Diagnosis not present

## 2022-04-26 DIAGNOSIS — N182 Chronic kidney disease, stage 2 (mild): Secondary | ICD-10-CM | POA: Diagnosis not present

## 2022-04-26 DIAGNOSIS — E1122 Type 2 diabetes mellitus with diabetic chronic kidney disease: Secondary | ICD-10-CM | POA: Diagnosis not present

## 2022-04-26 DIAGNOSIS — E039 Hypothyroidism, unspecified: Secondary | ICD-10-CM | POA: Diagnosis not present

## 2022-04-26 DIAGNOSIS — E782 Mixed hyperlipidemia: Secondary | ICD-10-CM | POA: Diagnosis not present

## 2022-04-26 DIAGNOSIS — R296 Repeated falls: Secondary | ICD-10-CM | POA: Diagnosis not present

## 2022-04-28 DIAGNOSIS — E1142 Type 2 diabetes mellitus with diabetic polyneuropathy: Secondary | ICD-10-CM | POA: Diagnosis not present

## 2022-04-28 DIAGNOSIS — E039 Hypothyroidism, unspecified: Secondary | ICD-10-CM | POA: Diagnosis not present

## 2022-04-28 DIAGNOSIS — R296 Repeated falls: Secondary | ICD-10-CM | POA: Diagnosis not present

## 2022-04-28 DIAGNOSIS — E782 Mixed hyperlipidemia: Secondary | ICD-10-CM | POA: Diagnosis not present

## 2022-04-28 DIAGNOSIS — S81811D Laceration without foreign body, right lower leg, subsequent encounter: Secondary | ICD-10-CM | POA: Diagnosis not present

## 2022-04-28 DIAGNOSIS — E1122 Type 2 diabetes mellitus with diabetic chronic kidney disease: Secondary | ICD-10-CM | POA: Diagnosis not present

## 2022-04-28 DIAGNOSIS — N182 Chronic kidney disease, stage 2 (mild): Secondary | ICD-10-CM | POA: Diagnosis not present

## 2022-04-28 DIAGNOSIS — I129 Hypertensive chronic kidney disease with stage 1 through stage 4 chronic kidney disease, or unspecified chronic kidney disease: Secondary | ICD-10-CM | POA: Diagnosis not present

## 2022-05-05 DIAGNOSIS — N182 Chronic kidney disease, stage 2 (mild): Secondary | ICD-10-CM | POA: Diagnosis not present

## 2022-05-05 DIAGNOSIS — E1142 Type 2 diabetes mellitus with diabetic polyneuropathy: Secondary | ICD-10-CM | POA: Diagnosis not present

## 2022-05-05 DIAGNOSIS — I129 Hypertensive chronic kidney disease with stage 1 through stage 4 chronic kidney disease, or unspecified chronic kidney disease: Secondary | ICD-10-CM | POA: Diagnosis not present

## 2022-05-05 DIAGNOSIS — R296 Repeated falls: Secondary | ICD-10-CM | POA: Diagnosis not present

## 2022-05-05 DIAGNOSIS — S81811D Laceration without foreign body, right lower leg, subsequent encounter: Secondary | ICD-10-CM | POA: Diagnosis not present

## 2022-05-05 DIAGNOSIS — E782 Mixed hyperlipidemia: Secondary | ICD-10-CM | POA: Diagnosis not present

## 2022-05-05 DIAGNOSIS — E1122 Type 2 diabetes mellitus with diabetic chronic kidney disease: Secondary | ICD-10-CM | POA: Diagnosis not present

## 2022-05-05 DIAGNOSIS — E039 Hypothyroidism, unspecified: Secondary | ICD-10-CM | POA: Diagnosis not present

## 2022-05-08 DIAGNOSIS — E782 Mixed hyperlipidemia: Secondary | ICD-10-CM | POA: Diagnosis not present

## 2022-05-08 DIAGNOSIS — E1122 Type 2 diabetes mellitus with diabetic chronic kidney disease: Secondary | ICD-10-CM | POA: Diagnosis not present

## 2022-05-08 DIAGNOSIS — I129 Hypertensive chronic kidney disease with stage 1 through stage 4 chronic kidney disease, or unspecified chronic kidney disease: Secondary | ICD-10-CM | POA: Diagnosis not present

## 2022-05-08 DIAGNOSIS — N182 Chronic kidney disease, stage 2 (mild): Secondary | ICD-10-CM | POA: Diagnosis not present

## 2022-05-08 DIAGNOSIS — E1142 Type 2 diabetes mellitus with diabetic polyneuropathy: Secondary | ICD-10-CM | POA: Diagnosis not present

## 2022-05-08 DIAGNOSIS — E039 Hypothyroidism, unspecified: Secondary | ICD-10-CM | POA: Diagnosis not present

## 2022-05-08 DIAGNOSIS — R296 Repeated falls: Secondary | ICD-10-CM | POA: Diagnosis not present

## 2022-05-08 DIAGNOSIS — S81811D Laceration without foreign body, right lower leg, subsequent encounter: Secondary | ICD-10-CM | POA: Diagnosis not present

## 2022-05-15 DIAGNOSIS — I129 Hypertensive chronic kidney disease with stage 1 through stage 4 chronic kidney disease, or unspecified chronic kidney disease: Secondary | ICD-10-CM | POA: Diagnosis not present

## 2022-05-15 DIAGNOSIS — E782 Mixed hyperlipidemia: Secondary | ICD-10-CM | POA: Diagnosis not present

## 2022-05-15 DIAGNOSIS — S81811D Laceration without foreign body, right lower leg, subsequent encounter: Secondary | ICD-10-CM | POA: Diagnosis not present

## 2022-05-15 DIAGNOSIS — E1122 Type 2 diabetes mellitus with diabetic chronic kidney disease: Secondary | ICD-10-CM | POA: Diagnosis not present

## 2022-05-15 DIAGNOSIS — N182 Chronic kidney disease, stage 2 (mild): Secondary | ICD-10-CM | POA: Diagnosis not present

## 2022-05-15 DIAGNOSIS — R296 Repeated falls: Secondary | ICD-10-CM | POA: Diagnosis not present

## 2022-05-15 DIAGNOSIS — E039 Hypothyroidism, unspecified: Secondary | ICD-10-CM | POA: Diagnosis not present

## 2022-05-15 DIAGNOSIS — E1142 Type 2 diabetes mellitus with diabetic polyneuropathy: Secondary | ICD-10-CM | POA: Diagnosis not present

## 2022-05-18 DIAGNOSIS — R051 Acute cough: Secondary | ICD-10-CM | POA: Diagnosis not present

## 2022-05-18 DIAGNOSIS — G629 Polyneuropathy, unspecified: Secondary | ICD-10-CM | POA: Diagnosis not present

## 2022-05-18 DIAGNOSIS — J069 Acute upper respiratory infection, unspecified: Secondary | ICD-10-CM | POA: Diagnosis not present

## 2022-05-18 DIAGNOSIS — I739 Peripheral vascular disease, unspecified: Secondary | ICD-10-CM | POA: Diagnosis not present

## 2022-05-22 DIAGNOSIS — E1122 Type 2 diabetes mellitus with diabetic chronic kidney disease: Secondary | ICD-10-CM | POA: Diagnosis not present

## 2022-05-22 DIAGNOSIS — E1142 Type 2 diabetes mellitus with diabetic polyneuropathy: Secondary | ICD-10-CM | POA: Diagnosis not present

## 2022-05-22 DIAGNOSIS — E782 Mixed hyperlipidemia: Secondary | ICD-10-CM | POA: Diagnosis not present

## 2022-05-22 DIAGNOSIS — R296 Repeated falls: Secondary | ICD-10-CM | POA: Diagnosis not present

## 2022-05-22 DIAGNOSIS — E039 Hypothyroidism, unspecified: Secondary | ICD-10-CM | POA: Diagnosis not present

## 2022-05-22 DIAGNOSIS — N182 Chronic kidney disease, stage 2 (mild): Secondary | ICD-10-CM | POA: Diagnosis not present

## 2022-05-22 DIAGNOSIS — I129 Hypertensive chronic kidney disease with stage 1 through stage 4 chronic kidney disease, or unspecified chronic kidney disease: Secondary | ICD-10-CM | POA: Diagnosis not present

## 2022-05-25 DIAGNOSIS — R051 Acute cough: Secondary | ICD-10-CM | POA: Diagnosis not present

## 2022-05-25 DIAGNOSIS — N182 Chronic kidney disease, stage 2 (mild): Secondary | ICD-10-CM | POA: Diagnosis not present

## 2022-05-25 DIAGNOSIS — R269 Unspecified abnormalities of gait and mobility: Secondary | ICD-10-CM | POA: Diagnosis not present

## 2022-05-25 DIAGNOSIS — I129 Hypertensive chronic kidney disease with stage 1 through stage 4 chronic kidney disease, or unspecified chronic kidney disease: Secondary | ICD-10-CM | POA: Diagnosis not present

## 2022-05-25 DIAGNOSIS — J069 Acute upper respiratory infection, unspecified: Secondary | ICD-10-CM | POA: Diagnosis not present

## 2022-05-30 DIAGNOSIS — E039 Hypothyroidism, unspecified: Secondary | ICD-10-CM | POA: Diagnosis not present

## 2022-05-30 DIAGNOSIS — E782 Mixed hyperlipidemia: Secondary | ICD-10-CM | POA: Diagnosis not present

## 2022-05-30 DIAGNOSIS — E1122 Type 2 diabetes mellitus with diabetic chronic kidney disease: Secondary | ICD-10-CM | POA: Diagnosis not present

## 2022-05-30 DIAGNOSIS — N182 Chronic kidney disease, stage 2 (mild): Secondary | ICD-10-CM | POA: Diagnosis not present

## 2022-05-30 DIAGNOSIS — I129 Hypertensive chronic kidney disease with stage 1 through stage 4 chronic kidney disease, or unspecified chronic kidney disease: Secondary | ICD-10-CM | POA: Diagnosis not present

## 2022-05-30 DIAGNOSIS — R296 Repeated falls: Secondary | ICD-10-CM | POA: Diagnosis not present

## 2022-05-30 DIAGNOSIS — E1142 Type 2 diabetes mellitus with diabetic polyneuropathy: Secondary | ICD-10-CM | POA: Diagnosis not present

## 2022-06-01 DIAGNOSIS — I129 Hypertensive chronic kidney disease with stage 1 through stage 4 chronic kidney disease, or unspecified chronic kidney disease: Secondary | ICD-10-CM | POA: Diagnosis not present

## 2022-06-01 DIAGNOSIS — R296 Repeated falls: Secondary | ICD-10-CM | POA: Diagnosis not present

## 2022-06-01 DIAGNOSIS — E1142 Type 2 diabetes mellitus with diabetic polyneuropathy: Secondary | ICD-10-CM | POA: Diagnosis not present

## 2022-06-05 DIAGNOSIS — I129 Hypertensive chronic kidney disease with stage 1 through stage 4 chronic kidney disease, or unspecified chronic kidney disease: Secondary | ICD-10-CM | POA: Diagnosis not present

## 2022-06-05 DIAGNOSIS — N182 Chronic kidney disease, stage 2 (mild): Secondary | ICD-10-CM | POA: Diagnosis not present

## 2022-06-05 DIAGNOSIS — E1142 Type 2 diabetes mellitus with diabetic polyneuropathy: Secondary | ICD-10-CM | POA: Diagnosis not present

## 2022-06-05 DIAGNOSIS — R296 Repeated falls: Secondary | ICD-10-CM | POA: Diagnosis not present

## 2022-06-05 DIAGNOSIS — E782 Mixed hyperlipidemia: Secondary | ICD-10-CM | POA: Diagnosis not present

## 2022-06-05 DIAGNOSIS — E1122 Type 2 diabetes mellitus with diabetic chronic kidney disease: Secondary | ICD-10-CM | POA: Diagnosis not present

## 2022-06-05 DIAGNOSIS — E039 Hypothyroidism, unspecified: Secondary | ICD-10-CM | POA: Diagnosis not present

## 2022-06-09 DIAGNOSIS — E559 Vitamin D deficiency, unspecified: Secondary | ICD-10-CM | POA: Diagnosis not present

## 2022-06-09 DIAGNOSIS — Z79899 Other long term (current) drug therapy: Secondary | ICD-10-CM | POA: Diagnosis not present

## 2022-06-12 DIAGNOSIS — I129 Hypertensive chronic kidney disease with stage 1 through stage 4 chronic kidney disease, or unspecified chronic kidney disease: Secondary | ICD-10-CM | POA: Diagnosis not present

## 2022-06-12 DIAGNOSIS — E1122 Type 2 diabetes mellitus with diabetic chronic kidney disease: Secondary | ICD-10-CM | POA: Diagnosis not present

## 2022-06-12 DIAGNOSIS — E782 Mixed hyperlipidemia: Secondary | ICD-10-CM | POA: Diagnosis not present

## 2022-06-12 DIAGNOSIS — E1142 Type 2 diabetes mellitus with diabetic polyneuropathy: Secondary | ICD-10-CM | POA: Diagnosis not present

## 2022-06-12 DIAGNOSIS — R296 Repeated falls: Secondary | ICD-10-CM | POA: Diagnosis not present

## 2022-06-12 DIAGNOSIS — E039 Hypothyroidism, unspecified: Secondary | ICD-10-CM | POA: Diagnosis not present

## 2022-06-12 DIAGNOSIS — N182 Chronic kidney disease, stage 2 (mild): Secondary | ICD-10-CM | POA: Diagnosis not present

## 2022-06-15 DIAGNOSIS — W01190A Fall on same level from slipping, tripping and stumbling with subsequent striking against furniture, initial encounter: Secondary | ICD-10-CM | POA: Diagnosis not present

## 2022-06-15 DIAGNOSIS — S0083XA Contusion of other part of head, initial encounter: Secondary | ICD-10-CM | POA: Diagnosis not present

## 2022-06-15 DIAGNOSIS — R296 Repeated falls: Secondary | ICD-10-CM | POA: Diagnosis not present

## 2022-06-15 DIAGNOSIS — R269 Unspecified abnormalities of gait and mobility: Secondary | ICD-10-CM | POA: Diagnosis not present

## 2022-06-16 DIAGNOSIS — L570 Actinic keratosis: Secondary | ICD-10-CM | POA: Diagnosis not present

## 2022-06-16 DIAGNOSIS — Z85820 Personal history of malignant melanoma of skin: Secondary | ICD-10-CM | POA: Diagnosis not present

## 2022-06-16 DIAGNOSIS — D225 Melanocytic nevi of trunk: Secondary | ICD-10-CM | POA: Diagnosis not present

## 2022-06-16 DIAGNOSIS — B078 Other viral warts: Secondary | ICD-10-CM | POA: Diagnosis not present

## 2022-06-16 DIAGNOSIS — Z08 Encounter for follow-up examination after completed treatment for malignant neoplasm: Secondary | ICD-10-CM | POA: Diagnosis not present

## 2022-06-16 DIAGNOSIS — X32XXXD Exposure to sunlight, subsequent encounter: Secondary | ICD-10-CM | POA: Diagnosis not present

## 2022-06-16 DIAGNOSIS — Z1283 Encounter for screening for malignant neoplasm of skin: Secondary | ICD-10-CM | POA: Diagnosis not present

## 2022-06-21 DIAGNOSIS — E1142 Type 2 diabetes mellitus with diabetic polyneuropathy: Secondary | ICD-10-CM | POA: Diagnosis not present

## 2022-06-21 DIAGNOSIS — R296 Repeated falls: Secondary | ICD-10-CM | POA: Diagnosis not present

## 2022-06-21 DIAGNOSIS — M859 Disorder of bone density and structure, unspecified: Secondary | ICD-10-CM | POA: Diagnosis not present

## 2022-06-21 DIAGNOSIS — I679 Cerebrovascular disease, unspecified: Secondary | ICD-10-CM | POA: Diagnosis not present

## 2022-06-21 DIAGNOSIS — E871 Hypo-osmolality and hyponatremia: Secondary | ICD-10-CM | POA: Diagnosis not present

## 2022-06-21 DIAGNOSIS — E039 Hypothyroidism, unspecified: Secondary | ICD-10-CM | POA: Diagnosis not present

## 2022-06-21 DIAGNOSIS — E1122 Type 2 diabetes mellitus with diabetic chronic kidney disease: Secondary | ICD-10-CM | POA: Diagnosis not present

## 2022-06-21 DIAGNOSIS — I69393 Ataxia following cerebral infarction: Secondary | ICD-10-CM | POA: Diagnosis not present

## 2022-06-21 DIAGNOSIS — I129 Hypertensive chronic kidney disease with stage 1 through stage 4 chronic kidney disease, or unspecified chronic kidney disease: Secondary | ICD-10-CM | POA: Diagnosis not present

## 2022-06-21 DIAGNOSIS — E559 Vitamin D deficiency, unspecified: Secondary | ICD-10-CM | POA: Diagnosis not present

## 2022-06-21 DIAGNOSIS — E782 Mixed hyperlipidemia: Secondary | ICD-10-CM | POA: Diagnosis not present

## 2022-06-21 DIAGNOSIS — N182 Chronic kidney disease, stage 2 (mild): Secondary | ICD-10-CM | POA: Diagnosis not present

## 2022-06-23 DIAGNOSIS — I7091 Generalized atherosclerosis: Secondary | ICD-10-CM | POA: Diagnosis not present

## 2022-06-26 DIAGNOSIS — E782 Mixed hyperlipidemia: Secondary | ICD-10-CM | POA: Diagnosis not present

## 2022-06-26 DIAGNOSIS — I129 Hypertensive chronic kidney disease with stage 1 through stage 4 chronic kidney disease, or unspecified chronic kidney disease: Secondary | ICD-10-CM | POA: Diagnosis not present

## 2022-06-26 DIAGNOSIS — E1142 Type 2 diabetes mellitus with diabetic polyneuropathy: Secondary | ICD-10-CM | POA: Diagnosis not present

## 2022-06-26 DIAGNOSIS — R296 Repeated falls: Secondary | ICD-10-CM | POA: Diagnosis not present

## 2022-06-26 DIAGNOSIS — E039 Hypothyroidism, unspecified: Secondary | ICD-10-CM | POA: Diagnosis not present

## 2022-06-26 DIAGNOSIS — N182 Chronic kidney disease, stage 2 (mild): Secondary | ICD-10-CM | POA: Diagnosis not present

## 2022-06-26 DIAGNOSIS — E1122 Type 2 diabetes mellitus with diabetic chronic kidney disease: Secondary | ICD-10-CM | POA: Diagnosis not present

## 2022-06-29 ENCOUNTER — Emergency Department
Admission: EM | Admit: 2022-06-29 | Discharge: 2022-06-29 | Disposition: A | Discharge status: Skilled Nursing Facility | Payer: Medicare Other | Attending: Emergency Medicine | Admitting: Emergency Medicine

## 2022-06-29 ENCOUNTER — Emergency Department: Payer: Medicare Other

## 2022-06-29 ENCOUNTER — Other Ambulatory Visit: Payer: Self-pay

## 2022-06-29 DIAGNOSIS — S022XXA Fracture of nasal bones, initial encounter for closed fracture: Secondary | ICD-10-CM | POA: Insufficient documentation

## 2022-06-29 DIAGNOSIS — I10 Essential (primary) hypertension: Secondary | ICD-10-CM | POA: Diagnosis not present

## 2022-06-29 DIAGNOSIS — Z79899 Other long term (current) drug therapy: Secondary | ICD-10-CM | POA: Insufficient documentation

## 2022-06-29 DIAGNOSIS — R0689 Other abnormalities of breathing: Secondary | ICD-10-CM | POA: Diagnosis not present

## 2022-06-29 DIAGNOSIS — Y92129 Unspecified place in nursing home as the place of occurrence of the external cause: Secondary | ICD-10-CM | POA: Diagnosis not present

## 2022-06-29 DIAGNOSIS — Z043 Encounter for examination and observation following other accident: Secondary | ICD-10-CM | POA: Diagnosis not present

## 2022-06-29 DIAGNOSIS — Z7982 Long term (current) use of aspirin: Secondary | ICD-10-CM | POA: Insufficient documentation

## 2022-06-29 DIAGNOSIS — R6889 Other general symptoms and signs: Secondary | ICD-10-CM | POA: Diagnosis not present

## 2022-06-29 DIAGNOSIS — W19XXXA Unspecified fall, initial encounter: Secondary | ICD-10-CM | POA: Diagnosis not present

## 2022-06-29 DIAGNOSIS — S0083XA Contusion of other part of head, initial encounter: Secondary | ICD-10-CM | POA: Diagnosis not present

## 2022-06-29 DIAGNOSIS — R69 Illness, unspecified: Secondary | ICD-10-CM | POA: Diagnosis not present

## 2022-06-29 DIAGNOSIS — Z743 Need for continuous supervision: Secondary | ICD-10-CM | POA: Diagnosis not present

## 2022-06-29 DIAGNOSIS — S0992XA Unspecified injury of nose, initial encounter: Secondary | ICD-10-CM | POA: Diagnosis present

## 2022-06-29 DIAGNOSIS — W06XXXA Fall from bed, initial encounter: Secondary | ICD-10-CM | POA: Diagnosis not present

## 2022-06-29 LAB — CBC WITH DIFFERENTIAL/PLATELET
Abs Immature Granulocytes: 0.04 10*3/uL (ref 0.00–0.07)
Basophils Absolute: 0.1 10*3/uL (ref 0.0–0.1)
Basophils Relative: 1 %
Eosinophils Absolute: 0.3 10*3/uL (ref 0.0–0.5)
Eosinophils Relative: 4 %
HCT: 41 % (ref 36.0–46.0)
Hemoglobin: 13.6 g/dL (ref 12.0–15.0)
Immature Granulocytes: 1 %
Lymphocytes Relative: 35 %
Lymphs Abs: 2.9 10*3/uL (ref 0.7–4.0)
MCH: 30 pg (ref 26.0–34.0)
MCHC: 33.2 g/dL (ref 30.0–36.0)
MCV: 90.3 fL (ref 80.0–100.0)
Monocytes Absolute: 0.5 10*3/uL (ref 0.1–1.0)
Monocytes Relative: 7 %
Neutro Abs: 4.4 10*3/uL (ref 1.7–7.7)
Neutrophils Relative %: 52 %
Platelets: 260 10*3/uL (ref 150–400)
RBC: 4.54 MIL/uL (ref 3.87–5.11)
RDW: 13.2 % (ref 11.5–15.5)
WBC: 8.2 10*3/uL (ref 4.0–10.5)
nRBC: 0 % (ref 0.0–0.2)

## 2022-06-29 LAB — BASIC METABOLIC PANEL
Anion gap: 9 (ref 5–15)
BUN: 16 mg/dL (ref 8–23)
CO2: 24 mmol/L (ref 22–32)
Calcium: 11.1 mg/dL — ABNORMAL HIGH (ref 8.9–10.3)
Chloride: 103 mmol/L (ref 98–111)
Creatinine, Ser: 0.85 mg/dL (ref 0.44–1.00)
GFR, Estimated: >60 mL/min (ref >60)
Glucose, Bld: 162 mg/dL — ABNORMAL HIGH (ref 70–99)
Potassium: 4 mmol/L (ref 3.5–5.1)
Sodium: 136 mmol/L (ref 135–145)

## 2022-06-29 LAB — MAGNESIUM: Magnesium: 2.1 mg/dL (ref 1.7–2.4)

## 2022-06-29 MED ORDER — SILVER NITRATE-POT NITRATE 75-25 % EX MISC
1.0000 | Freq: Once | CUTANEOUS | Status: AC
  Administered 2022-06-29: 1 via TOPICAL
  Filled 2022-06-29: qty 10

## 2022-06-29 MED ORDER — ACETAMINOPHEN 325 MG PO TABS
650.0000 mg | ORAL_TABLET | Freq: Once | ORAL | Status: AC
  Administered 2022-06-29: 650 mg via ORAL
  Filled 2022-06-29: qty 2

## 2022-06-29 MED ORDER — SODIUM CHLORIDE 0.9 % IV BOLUS (SEPSIS)
1000.0000 mL | Freq: Once | INTRAVENOUS | Status: AC
  Administered 2022-06-29: 1000 mL via INTRAVENOUS

## 2022-06-29 NOTE — ED Provider Notes (Signed)
[facility] Provider Note    Event Date/Time   First MD Initiated Contact with Patient 06/29/22 0406     (approximate)   History   Fall   HPI  MARICELLA FILYAW is a 88 y.o. female brought to the ED via EMS from assisted living facility status fall out of bed and struck her nose on her right forearm.  Denies striking head or LOC.  Denies anticoagulant use.  Complains of mild nasal pain.  Denies vision changes, neck pain, chest pain, shortness of breath, abdominal pain, nausea, vomiting or dizziness.     Past Medical History   Past Medical History:  Diagnosis Date   Abnormality of gait 03/20/2013   Bilateral foot-drop 07/28/2020   Cerebral ventriculomegaly    Gastroesophageal reflux disease    History of stroke    Right inferior cerebellum   Hypertension    Osteopenia    Peripheral neuropathy 11/29/2020   Stroke [facility])    Thyroid disease    Urinary frequency 04/19/2011   Urinary incontinence      Active Problem List   Patient Active Problem List   Diagnosis Date Noted   Overweight (BMI 25.0-29.9) 01/04/2022   Hypertension    History of stroke    Thyroid disease    Intestinal infection, enteritis due to rotavirus    Transaminitis    Frequent falls    Peripheral neuropathy 11/29/2020   Cerebral ventriculomegaly 11/29/2020   Bilateral foot-drop 07/28/2020   Abnormality of gait 03/20/2013   Urinary frequency 04/19/2011     Past Surgical History   Past Surgical History:  Procedure Laterality Date   ABDOMINAL HYSTERECTOMY     APPENDECTOMY     BACK SURGERY     x2   BLADDER SURGERY  2010   CHOLECYSTECTOMY     ESOPHAGUS SURGERY     tumor removal.   TONSILLECTOMY     at [place]  2010     Home Medications   Prior to Admission medications   Medication Sig Start Date End Date Taking? Authorizing Provider  aspirin 81 MG tablet Take 81 mg by mouth daily.    [provider]  levothyroxine (SYNTHROID, LEVOTHROID)  50 MCG tablet Take 50 mcg by mouth daily.    [provider]  loperamide (IMODIUM) 2 MG capsule Take 1 capsule (2 mg total) by mouth as needed for diarrhea or loose stools. 01/05/22   Annita Brod, MD  losartan (COZAAR) 100 MG tablet Take 100 mg by mouth daily. 11/10/21   [provider]  mirabegron ER (MYRBETRIQ) 50 MG TB24 tablet Take 1 tablet (50 mg total) by mouth daily. 05/30/21   Kathrynn Ducking, MD  mirtazapine (REMERON) 15 MG tablet Take 7.5 mg by mouth daily. 12/26/21   [provider]  ondansetron (ZOFRAN-ODT) 4 MG disintegrating tablet Take 1 tablet (4 mg total) by mouth every 8 (eight) hours as needed for nausea or vomiting. 01/05/22   Annita Brod, MD  sertraline (ZOLOFT) 50 MG tablet 1 tablet 11/29/21   [provider]     Allergies  Demerol and Morphine and related   Family History   Family History  Problem Relation Age of Onset   Diabetes Mother    Hypertension Mother    Arthritis Father        died at age 6   Breast cancer Sister 21       mastectomy     Physical Exam  Triage Vital Signs: ED Triage Vitals  Enc Vitals Group     BP 06/29/22 0028 (!) 122/98     Pulse Rate 06/29/22 0028 99     Resp 06/29/22 0028 18     Temp 06/29/22 0028 (!) 97.5 F (36.4 C)     Temp Source 06/29/22 0028 Oral     SpO2 06/29/22 0028 99 %     Weight 06/29/22 0029 190 lb (86.2 kg)     Height 06/29/22 0029 '5\' 7"'$  (1.702 m)     Head Circumference --      Peak Flow --      Pain Score 06/29/22 0029 7     Pain Loc --      Pain Edu? --      Excl. in [place]? --     Updated Vital Signs: BP (!) 122/98 (BP Location: Left Arm)   Pulse 99   Temp (!) 97.5 F (36.4 C) (Oral)   Resp 18   Ht '5\' 7"'$  (1.702 m)   Wt 86.2 kg   SpO2 99%   BMI 29.76 kg/m    General: Awake, no distress.  CV:  RRR.  Good peripheral perfusion.  Resp:  Normal effort.  CTA B. Abd:  Nontender.  No distention.  Other:  Bruising and mild swelling to nose.  No active  bleeding.  Left anterior nostril with small spot of oozing.  Bruising under right eye.  Bruising to right jaw.  No dental malocclusion.  No cervical spine tenderness to palpation.   ED Results / Procedures / Treatments  Labs (all labs ordered are listed, but only abnormal results are displayed) Labs Reviewed  BASIC METABOLIC PANEL - Abnormal; Notable for the following components:      Result Value   Glucose, Bld 162 (*)    Calcium 11.1 (*)    All other components within normal limits  CBC WITH DIFFERENTIAL/PLATELET  MAGNESIUM  URINALYSIS, ROUTINE W REFLEX MICROSCOPIC     EKG  ED ECG REPORT I, Jennet Scroggin J, the attending physician, personally viewed and interpreted this ECG.   Date: 06/29/2022  EKG Time: 0244  Rate: 83  Rhythm: normal sinus rhythm  Axis: Normal  Intervals:none  ST&T Change: Nonspecific    RADIOLOGY I have independently visualized and interpreted patient's CT scans as well as noted the radiology interpretation:  CT head: No ICH  CT cervical spine: No acute osseous injuries  CT maxillofacial: Bilateral nasal bones and septum fractures  Official radiology report(s): CT Head Wo Contrast  Result Date: 06/29/2022 CLINICAL DATA:  Bruising and abrasions to face after fall out of bed EXAM: CT HEAD WITHOUT CONTRAST CT MAXILLOFACIAL WITHOUT CONTRAST CT CERVICAL SPINE WITHOUT CONTRAST TECHNIQUE: Multidetector CT imaging of the head, cervical spine, and maxillofacial structures were performed using the standard protocol without intravenous contrast. Multiplanar CT image reconstructions of the cervical spine and maxillofacial structures were also generated. RADIATION DOSE REDUCTION: This exam was performed according to the departmental dose-optimization program which includes automated exposure control, adjustment of the mA and/or kV according to patient size and/or use of iterative reconstruction technique. COMPARISON:  CT head 01/02/2022 FINDINGS: CT HEAD FINDINGS  Brain: No intracranial hemorrhage, mass effect, or evidence of acute infarct. No hydrocephalus. No extra-axial fluid collection. Marked cerebral atrophy with ex vacuo dilatation of the ventricles. Ill-defined hypoattenuation within the cerebral white matter is nonspecific but consistent with chronic small vessel ischemic disease. Vascular: No hyperdense vessel. Intracranial arterial calcification. Skull: No fracture or focal lesion. Other:  None. CT MAXILLOFACIAL FINDINGS Osseous: Comminuted bilateral nasal bone fractures. There is mild impaction of the left nasal bone fracture. Acute mildly displaced nasal septal fracture. Mild soft tissue thickening about the nasal septum. Orbits: Negative. No traumatic or inflammatory finding. Sinuses: Soft tissue thickening about the nasal turbinates and nasal septum. No definite nasal septal hematoma. Soft tissues: Swelling about the nose. CT CERVICAL SPINE FINDINGS Alignment: Straightening of the normal cervical lordosis is likely chronic/positional. Skull base and vertebrae: No acute fracture. No primary bone lesion or focal pathologic process. Soft tissues and spinal canal: No prevertebral fluid or swelling. No visible canal hematoma. Disc levels: Multilevel spondylosis and disc space height loss greatest at C5-C6 and C6-C7 where it is moderate. Posterior disc osteophyte complexes cause multilevel mild effacement of the ventral thecal sac. No high-grade spinal canal narrowing. Multilevel facet arthropathy greatest at C3-C4 on the right where there is ankylosis of the facet joints. Uncovertebral spurring and facet arthropathy cause multilevel neural foraminal narrowing which is advanced on the right at C3-C4 and on the right at C6-C7. Upper chest: No acute abnormality. Other: Carotid calcification. IMPRESSION: 1. No acute intracranial abnormality or calvarial fracture. 2. Bilateral nasal bone fractures. 3. Nasal septal fracture. No definite nasal septal hematoma though  clinical correlation is recommended. 4. No acute cervical spine fracture.  Multilevel spondylosis. Electronically Signed   By: Placido Sou M.D.   On: 06/29/2022 01:10   CT Maxillofacial Wo Contrast  Result Date: 06/29/2022 CLINICAL DATA:  Bruising and abrasions to face after fall out of bed EXAM: CT HEAD WITHOUT CONTRAST CT MAXILLOFACIAL WITHOUT CONTRAST CT CERVICAL SPINE WITHOUT CONTRAST TECHNIQUE: Multidetector CT imaging of the head, cervical spine, and maxillofacial structures were performed using the standard protocol without intravenous contrast. Multiplanar CT image reconstructions of the cervical spine and maxillofacial structures were also generated. RADIATION DOSE REDUCTION: This exam was performed according to the departmental dose-optimization program which includes automated exposure control, adjustment of the mA and/or kV according to patient size and/or use of iterative reconstruction technique. COMPARISON:  CT head 01/02/2022 FINDINGS: CT HEAD FINDINGS Brain: No intracranial hemorrhage, mass effect, or evidence of acute infarct. No hydrocephalus. No extra-axial fluid collection. Marked cerebral atrophy with ex vacuo dilatation of the ventricles. Ill-defined hypoattenuation within the cerebral white matter is nonspecific but consistent with chronic small vessel ischemic disease. Vascular: No hyperdense vessel. Intracranial arterial calcification. Skull: No fracture or focal lesion. Other: None. CT MAXILLOFACIAL FINDINGS Osseous: Comminuted bilateral nasal bone fractures. There is mild impaction of the left nasal bone fracture. Acute mildly displaced nasal septal fracture. Mild soft tissue thickening about the nasal septum. Orbits: Negative. No traumatic or inflammatory finding. Sinuses: Soft tissue thickening about the nasal turbinates and nasal septum. No definite nasal septal hematoma. Soft tissues: Swelling about the nose. CT CERVICAL SPINE FINDINGS Alignment: Straightening of the normal  cervical lordosis is likely chronic/positional. Skull base and vertebrae: No acute fracture. No primary bone lesion or focal pathologic process. Soft tissues and spinal canal: No prevertebral fluid or swelling. No visible canal hematoma. Disc levels: Multilevel spondylosis and disc space height loss greatest at C5-C6 and C6-C7 where it is moderate. Posterior disc osteophyte complexes cause multilevel mild effacement of the ventral thecal sac. No high-grade spinal canal narrowing. Multilevel facet arthropathy greatest at C3-C4 on the right where there is ankylosis of the facet joints. Uncovertebral spurring and facet arthropathy cause multilevel neural foraminal narrowing which is advanced on the right at C3-C4 and on the right at C6-C7.  Upper chest: No acute abnormality. Other: Carotid calcification. IMPRESSION: 1. No acute intracranial abnormality or calvarial fracture. 2. Bilateral nasal bone fractures. 3. Nasal septal fracture. No definite nasal septal hematoma though clinical correlation is recommended. 4. No acute cervical spine fracture.  Multilevel spondylosis. Electronically Signed   By: Placido Sou M.D.   On: 06/29/2022 01:10   CT Cervical Spine Wo Contrast  Result Date: 06/29/2022 CLINICAL DATA:  Bruising and abrasions to face after fall out of bed EXAM: CT HEAD WITHOUT CONTRAST CT MAXILLOFACIAL WITHOUT CONTRAST CT CERVICAL SPINE WITHOUT CONTRAST TECHNIQUE: Multidetector CT imaging of the head, cervical spine, and maxillofacial structures were performed using the standard protocol without intravenous contrast. Multiplanar CT image reconstructions of the cervical spine and maxillofacial structures were also generated. RADIATION DOSE REDUCTION: This exam was performed according to the departmental dose-optimization program which includes automated exposure control, adjustment of the mA and/or kV according to patient size and/or use of iterative reconstruction technique. COMPARISON:  CT head  01/02/2022 FINDINGS: CT HEAD FINDINGS Brain: No intracranial hemorrhage, mass effect, or evidence of acute infarct. No hydrocephalus. No extra-axial fluid collection. Marked cerebral atrophy with ex vacuo dilatation of the ventricles. Ill-defined hypoattenuation within the cerebral white matter is nonspecific but consistent with chronic small vessel ischemic disease. Vascular: No hyperdense vessel. Intracranial arterial calcification. Skull: No fracture or focal lesion. Other: None. CT MAXILLOFACIAL FINDINGS Osseous: Comminuted bilateral nasal bone fractures. There is mild impaction of the left nasal bone fracture. Acute mildly displaced nasal septal fracture. Mild soft tissue thickening about the nasal septum. Orbits: Negative. No traumatic or inflammatory finding. Sinuses: Soft tissue thickening about the nasal turbinates and nasal septum. No definite nasal septal hematoma. Soft tissues: Swelling about the nose. CT CERVICAL SPINE FINDINGS Alignment: Straightening of the normal cervical lordosis is likely chronic/positional. Skull base and vertebrae: No acute fracture. No primary bone lesion or focal pathologic process. Soft tissues and spinal canal: No prevertebral fluid or swelling. No visible canal hematoma. Disc levels: Multilevel spondylosis and disc space height loss greatest at C5-C6 and C6-C7 where it is moderate. Posterior disc osteophyte complexes cause multilevel mild effacement of the ventral thecal sac. No high-grade spinal canal narrowing. Multilevel facet arthropathy greatest at C3-C4 on the right where there is ankylosis of the facet joints. Uncovertebral spurring and facet arthropathy cause multilevel neural foraminal narrowing which is advanced on the right at C3-C4 and on the right at C6-C7. Upper chest: No acute abnormality. Other: Carotid calcification. IMPRESSION: 1. No acute intracranial abnormality or calvarial fracture. 2. Bilateral nasal bone fractures. 3. Nasal septal fracture. No  definite nasal septal hematoma though clinical correlation is recommended. 4. No acute cervical spine fracture.  Multilevel spondylosis. Electronically Signed   By: Placido Sou M.D.   On: 06/29/2022 01:10     PROCEDURES:  Critical Care performed: No  Procedures   MEDICATIONS ORDERED IN ED: Medications  sodium chloride 0.9 % bolus 1,000 mL (has no administration in time range)     IMPRESSION / MDM / ASSESSMENT AND PLAN / ED COURSE  I reviewed the triage vital signs and the nursing notes.                             88 year old female who presents with facial contusions status post mechanical fall.  Differential diagnosis includes but is not limited to [place], cervical spine fracture, maxillofacial fracture, etc.  I have personally reviewed patient's records and noted patient  had a office visit on 03/02/2022 for hypertension, CKD.  Patient's presentation is most consistent with acute complicated illness / injury requiring diagnostic workup.  Laboratory results unremarkable.  CT imaging studies remarkable for bilateral nasal bone fractures.  Silver nitrate stick applied to small spot in the anterior left nostril with good effect.  Tylenol administered per patient's request.  Strict return precautions given.  Patient and daughter verbalized understanding and agreed with plan of care.      FINAL CLINICAL IMPRESSION(S) / ED DIAGNOSES   Final diagnoses:  Fall, initial encounter  Closed fracture of nasal bone, initial encounter     Rx / DC Orders   ED Discharge Orders     None        Note:  This document was prepared using Dragon voice recognition software and may include unintentional dictation errors.   Paulette Blanch, MD 06/29/22 [phone]

## 2022-06-29 NOTE — ED Triage Notes (Signed)
Pt BIB EMS for fall out of bed. Pt denies LOC. Pt has bruising and abrasions to her face. Pts nose is bleeding. Pt does not take blood thinners.

## 2022-06-29 NOTE — Discharge Instructions (Signed)
Apply ice to affected area several times daily. You may take Tylenol as needed for discomfort. Return to the ER for worsening symptoms, persistent vomiting, difficulty breathing or other concerns.

## 2022-07-03 DIAGNOSIS — E782 Mixed hyperlipidemia: Secondary | ICD-10-CM | POA: Diagnosis not present

## 2022-07-03 DIAGNOSIS — N182 Chronic kidney disease, stage 2 (mild): Secondary | ICD-10-CM | POA: Diagnosis not present

## 2022-07-03 DIAGNOSIS — R296 Repeated falls: Secondary | ICD-10-CM | POA: Diagnosis not present

## 2022-07-03 DIAGNOSIS — E1122 Type 2 diabetes mellitus with diabetic chronic kidney disease: Secondary | ICD-10-CM | POA: Diagnosis not present

## 2022-07-03 DIAGNOSIS — I129 Hypertensive chronic kidney disease with stage 1 through stage 4 chronic kidney disease, or unspecified chronic kidney disease: Secondary | ICD-10-CM | POA: Diagnosis not present

## 2022-07-03 DIAGNOSIS — E1142 Type 2 diabetes mellitus with diabetic polyneuropathy: Secondary | ICD-10-CM | POA: Diagnosis not present

## 2022-07-03 DIAGNOSIS — E039 Hypothyroidism, unspecified: Secondary | ICD-10-CM | POA: Diagnosis not present

## 2022-07-10 DIAGNOSIS — E039 Hypothyroidism, unspecified: Secondary | ICD-10-CM | POA: Diagnosis not present

## 2022-07-10 DIAGNOSIS — R296 Repeated falls: Secondary | ICD-10-CM | POA: Diagnosis not present

## 2022-07-10 DIAGNOSIS — I129 Hypertensive chronic kidney disease with stage 1 through stage 4 chronic kidney disease, or unspecified chronic kidney disease: Secondary | ICD-10-CM | POA: Diagnosis not present

## 2022-07-10 DIAGNOSIS — E1122 Type 2 diabetes mellitus with diabetic chronic kidney disease: Secondary | ICD-10-CM | POA: Diagnosis not present

## 2022-07-10 DIAGNOSIS — E782 Mixed hyperlipidemia: Secondary | ICD-10-CM | POA: Diagnosis not present

## 2022-07-10 DIAGNOSIS — E1142 Type 2 diabetes mellitus with diabetic polyneuropathy: Secondary | ICD-10-CM | POA: Diagnosis not present

## 2022-07-10 DIAGNOSIS — N182 Chronic kidney disease, stage 2 (mild): Secondary | ICD-10-CM | POA: Diagnosis not present

## 2022-07-22 DIAGNOSIS — Z9181 History of falling: Secondary | ICD-10-CM | POA: Diagnosis not present

## 2022-07-22 DIAGNOSIS — N182 Chronic kidney disease, stage 2 (mild): Secondary | ICD-10-CM | POA: Diagnosis not present

## 2022-07-22 DIAGNOSIS — I129 Hypertensive chronic kidney disease with stage 1 through stage 4 chronic kidney disease, or unspecified chronic kidney disease: Secondary | ICD-10-CM | POA: Diagnosis not present

## 2022-07-22 DIAGNOSIS — E1142 Type 2 diabetes mellitus with diabetic polyneuropathy: Secondary | ICD-10-CM | POA: Diagnosis not present

## 2022-07-22 DIAGNOSIS — E782 Mixed hyperlipidemia: Secondary | ICD-10-CM | POA: Diagnosis not present

## 2022-07-22 DIAGNOSIS — R296 Repeated falls: Secondary | ICD-10-CM | POA: Diagnosis not present

## 2022-07-22 DIAGNOSIS — Z7982 Long term (current) use of aspirin: Secondary | ICD-10-CM | POA: Diagnosis not present

## 2022-07-22 DIAGNOSIS — E1122 Type 2 diabetes mellitus with diabetic chronic kidney disease: Secondary | ICD-10-CM | POA: Diagnosis not present

## 2022-07-22 DIAGNOSIS — E039 Hypothyroidism, unspecified: Secondary | ICD-10-CM | POA: Diagnosis not present

## 2022-07-26 DIAGNOSIS — K219 Gastro-esophageal reflux disease without esophagitis: Secondary | ICD-10-CM | POA: Diagnosis not present

## 2022-07-26 DIAGNOSIS — E559 Vitamin D deficiency, unspecified: Secondary | ICD-10-CM | POA: Diagnosis not present

## 2022-07-26 DIAGNOSIS — K5901 Slow transit constipation: Secondary | ICD-10-CM | POA: Diagnosis not present

## 2022-07-26 DIAGNOSIS — R296 Repeated falls: Secondary | ICD-10-CM | POA: Diagnosis not present

## 2022-07-26 DIAGNOSIS — I129 Hypertensive chronic kidney disease with stage 1 through stage 4 chronic kidney disease, or unspecified chronic kidney disease: Secondary | ICD-10-CM | POA: Diagnosis not present

## 2022-07-26 DIAGNOSIS — N182 Chronic kidney disease, stage 2 (mild): Secondary | ICD-10-CM | POA: Diagnosis not present

## 2022-07-26 DIAGNOSIS — W06XXXA Fall from bed, initial encounter: Secondary | ICD-10-CM | POA: Diagnosis not present

## 2022-07-28 DIAGNOSIS — E782 Mixed hyperlipidemia: Secondary | ICD-10-CM | POA: Diagnosis not present

## 2022-07-28 DIAGNOSIS — Z9181 History of falling: Secondary | ICD-10-CM | POA: Diagnosis not present

## 2022-07-28 DIAGNOSIS — E1142 Type 2 diabetes mellitus with diabetic polyneuropathy: Secondary | ICD-10-CM | POA: Diagnosis not present

## 2022-07-28 DIAGNOSIS — N182 Chronic kidney disease, stage 2 (mild): Secondary | ICD-10-CM | POA: Diagnosis not present

## 2022-07-28 DIAGNOSIS — R296 Repeated falls: Secondary | ICD-10-CM | POA: Diagnosis not present

## 2022-07-28 DIAGNOSIS — E1122 Type 2 diabetes mellitus with diabetic chronic kidney disease: Secondary | ICD-10-CM | POA: Diagnosis not present

## 2022-07-28 DIAGNOSIS — E039 Hypothyroidism, unspecified: Secondary | ICD-10-CM | POA: Diagnosis not present

## 2022-07-28 DIAGNOSIS — I129 Hypertensive chronic kidney disease with stage 1 through stage 4 chronic kidney disease, or unspecified chronic kidney disease: Secondary | ICD-10-CM | POA: Diagnosis not present

## 2022-07-28 DIAGNOSIS — Z7982 Long term (current) use of aspirin: Secondary | ICD-10-CM | POA: Diagnosis not present

## 2022-07-31 DIAGNOSIS — R300 Dysuria: Secondary | ICD-10-CM | POA: Diagnosis not present

## 2022-08-02 DIAGNOSIS — Z9181 History of falling: Secondary | ICD-10-CM | POA: Diagnosis not present

## 2022-08-02 DIAGNOSIS — R296 Repeated falls: Secondary | ICD-10-CM | POA: Diagnosis not present

## 2022-08-02 DIAGNOSIS — Z7982 Long term (current) use of aspirin: Secondary | ICD-10-CM | POA: Diagnosis not present

## 2022-08-02 DIAGNOSIS — R410 Disorientation, unspecified: Secondary | ICD-10-CM | POA: Diagnosis not present

## 2022-08-02 DIAGNOSIS — N182 Chronic kidney disease, stage 2 (mild): Secondary | ICD-10-CM | POA: Diagnosis not present

## 2022-08-02 DIAGNOSIS — E782 Mixed hyperlipidemia: Secondary | ICD-10-CM | POA: Diagnosis not present

## 2022-08-02 DIAGNOSIS — E039 Hypothyroidism, unspecified: Secondary | ICD-10-CM | POA: Diagnosis not present

## 2022-08-02 DIAGNOSIS — E1122 Type 2 diabetes mellitus with diabetic chronic kidney disease: Secondary | ICD-10-CM | POA: Diagnosis not present

## 2022-08-02 DIAGNOSIS — I129 Hypertensive chronic kidney disease with stage 1 through stage 4 chronic kidney disease, or unspecified chronic kidney disease: Secondary | ICD-10-CM | POA: Diagnosis not present

## 2022-08-02 DIAGNOSIS — E1142 Type 2 diabetes mellitus with diabetic polyneuropathy: Secondary | ICD-10-CM | POA: Diagnosis not present

## 2022-08-07 DIAGNOSIS — I129 Hypertensive chronic kidney disease with stage 1 through stage 4 chronic kidney disease, or unspecified chronic kidney disease: Secondary | ICD-10-CM | POA: Diagnosis not present

## 2022-08-07 DIAGNOSIS — Z9181 History of falling: Secondary | ICD-10-CM | POA: Diagnosis not present

## 2022-08-07 DIAGNOSIS — E782 Mixed hyperlipidemia: Secondary | ICD-10-CM | POA: Diagnosis not present

## 2022-08-07 DIAGNOSIS — E1142 Type 2 diabetes mellitus with diabetic polyneuropathy: Secondary | ICD-10-CM | POA: Diagnosis not present

## 2022-08-07 DIAGNOSIS — E039 Hypothyroidism, unspecified: Secondary | ICD-10-CM | POA: Diagnosis not present

## 2022-08-07 DIAGNOSIS — E1122 Type 2 diabetes mellitus with diabetic chronic kidney disease: Secondary | ICD-10-CM | POA: Diagnosis not present

## 2022-08-07 DIAGNOSIS — N182 Chronic kidney disease, stage 2 (mild): Secondary | ICD-10-CM | POA: Diagnosis not present

## 2022-08-07 DIAGNOSIS — Z7982 Long term (current) use of aspirin: Secondary | ICD-10-CM | POA: Diagnosis not present

## 2022-08-07 DIAGNOSIS — R296 Repeated falls: Secondary | ICD-10-CM | POA: Diagnosis not present

## 2022-08-08 DIAGNOSIS — G629 Polyneuropathy, unspecified: Secondary | ICD-10-CM | POA: Diagnosis not present

## 2022-08-08 DIAGNOSIS — I739 Peripheral vascular disease, unspecified: Secondary | ICD-10-CM | POA: Diagnosis not present

## 2022-08-09 DIAGNOSIS — N390 Urinary tract infection, site not specified: Secondary | ICD-10-CM | POA: Diagnosis not present

## 2022-08-09 DIAGNOSIS — B3731 Acute candidiasis of vulva and vagina: Secondary | ICD-10-CM | POA: Diagnosis not present

## 2022-08-09 DIAGNOSIS — W1811XA Fall from or off toilet without subsequent striking against object, initial encounter: Secondary | ICD-10-CM | POA: Diagnosis not present

## 2022-08-09 DIAGNOSIS — R410 Disorientation, unspecified: Secondary | ICD-10-CM | POA: Diagnosis not present

## 2022-08-09 DIAGNOSIS — R269 Unspecified abnormalities of gait and mobility: Secondary | ICD-10-CM | POA: Diagnosis not present

## 2022-08-16 DIAGNOSIS — R296 Repeated falls: Secondary | ICD-10-CM | POA: Diagnosis not present

## 2022-08-16 DIAGNOSIS — Z7982 Long term (current) use of aspirin: Secondary | ICD-10-CM | POA: Diagnosis not present

## 2022-08-16 DIAGNOSIS — E782 Mixed hyperlipidemia: Secondary | ICD-10-CM | POA: Diagnosis not present

## 2022-08-16 DIAGNOSIS — E1122 Type 2 diabetes mellitus with diabetic chronic kidney disease: Secondary | ICD-10-CM | POA: Diagnosis not present

## 2022-08-16 DIAGNOSIS — N182 Chronic kidney disease, stage 2 (mild): Secondary | ICD-10-CM | POA: Diagnosis not present

## 2022-08-16 DIAGNOSIS — E1142 Type 2 diabetes mellitus with diabetic polyneuropathy: Secondary | ICD-10-CM | POA: Diagnosis not present

## 2022-08-16 DIAGNOSIS — E039 Hypothyroidism, unspecified: Secondary | ICD-10-CM | POA: Diagnosis not present

## 2022-08-16 DIAGNOSIS — I129 Hypertensive chronic kidney disease with stage 1 through stage 4 chronic kidney disease, or unspecified chronic kidney disease: Secondary | ICD-10-CM | POA: Diagnosis not present

## 2022-08-16 DIAGNOSIS — Z9181 History of falling: Secondary | ICD-10-CM | POA: Diagnosis not present

## 2022-08-22 DIAGNOSIS — Z9181 History of falling: Secondary | ICD-10-CM | POA: Diagnosis not present

## 2022-08-22 DIAGNOSIS — N182 Chronic kidney disease, stage 2 (mild): Secondary | ICD-10-CM | POA: Diagnosis not present

## 2022-08-22 DIAGNOSIS — E039 Hypothyroidism, unspecified: Secondary | ICD-10-CM | POA: Diagnosis not present

## 2022-08-22 DIAGNOSIS — Z7982 Long term (current) use of aspirin: Secondary | ICD-10-CM | POA: Diagnosis not present

## 2022-08-22 DIAGNOSIS — E1142 Type 2 diabetes mellitus with diabetic polyneuropathy: Secondary | ICD-10-CM | POA: Diagnosis not present

## 2022-08-22 DIAGNOSIS — E1122 Type 2 diabetes mellitus with diabetic chronic kidney disease: Secondary | ICD-10-CM | POA: Diagnosis not present

## 2022-08-22 DIAGNOSIS — R296 Repeated falls: Secondary | ICD-10-CM | POA: Diagnosis not present

## 2022-08-22 DIAGNOSIS — I129 Hypertensive chronic kidney disease with stage 1 through stage 4 chronic kidney disease, or unspecified chronic kidney disease: Secondary | ICD-10-CM | POA: Diagnosis not present

## 2022-08-22 DIAGNOSIS — E782 Mixed hyperlipidemia: Secondary | ICD-10-CM | POA: Diagnosis not present

## 2022-08-23 DIAGNOSIS — R269 Unspecified abnormalities of gait and mobility: Secondary | ICD-10-CM | POA: Diagnosis not present

## 2022-08-23 DIAGNOSIS — K5900 Constipation, unspecified: Secondary | ICD-10-CM | POA: Diagnosis not present

## 2022-08-23 DIAGNOSIS — Z8673 Personal history of transient ischemic attack (TIA), and cerebral infarction without residual deficits: Secondary | ICD-10-CM | POA: Diagnosis not present

## 2022-08-23 DIAGNOSIS — K219 Gastro-esophageal reflux disease without esophagitis: Secondary | ICD-10-CM | POA: Diagnosis not present

## 2022-08-23 DIAGNOSIS — N182 Chronic kidney disease, stage 2 (mild): Secondary | ICD-10-CM | POA: Diagnosis not present

## 2022-08-23 DIAGNOSIS — I129 Hypertensive chronic kidney disease with stage 1 through stage 4 chronic kidney disease, or unspecified chronic kidney disease: Secondary | ICD-10-CM | POA: Diagnosis not present

## 2022-08-28 DIAGNOSIS — E782 Mixed hyperlipidemia: Secondary | ICD-10-CM | POA: Diagnosis not present

## 2022-08-28 DIAGNOSIS — I129 Hypertensive chronic kidney disease with stage 1 through stage 4 chronic kidney disease, or unspecified chronic kidney disease: Secondary | ICD-10-CM | POA: Diagnosis not present

## 2022-08-28 DIAGNOSIS — E1122 Type 2 diabetes mellitus with diabetic chronic kidney disease: Secondary | ICD-10-CM | POA: Diagnosis not present

## 2022-08-28 DIAGNOSIS — E1142 Type 2 diabetes mellitus with diabetic polyneuropathy: Secondary | ICD-10-CM | POA: Diagnosis not present

## 2022-08-28 DIAGNOSIS — Z9181 History of falling: Secondary | ICD-10-CM | POA: Diagnosis not present

## 2022-08-28 DIAGNOSIS — N182 Chronic kidney disease, stage 2 (mild): Secondary | ICD-10-CM | POA: Diagnosis not present

## 2022-08-28 DIAGNOSIS — R296 Repeated falls: Secondary | ICD-10-CM | POA: Diagnosis not present

## 2022-08-28 DIAGNOSIS — E039 Hypothyroidism, unspecified: Secondary | ICD-10-CM | POA: Diagnosis not present

## 2022-08-28 DIAGNOSIS — Z7982 Long term (current) use of aspirin: Secondary | ICD-10-CM | POA: Diagnosis not present

## 2022-09-01 DIAGNOSIS — Z79899 Other long term (current) drug therapy: Secondary | ICD-10-CM | POA: Diagnosis not present

## 2022-09-01 DIAGNOSIS — E559 Vitamin D deficiency, unspecified: Secondary | ICD-10-CM | POA: Diagnosis not present

## 2022-09-04 DIAGNOSIS — Z7982 Long term (current) use of aspirin: Secondary | ICD-10-CM | POA: Diagnosis not present

## 2022-09-04 DIAGNOSIS — E1122 Type 2 diabetes mellitus with diabetic chronic kidney disease: Secondary | ICD-10-CM | POA: Diagnosis not present

## 2022-09-04 DIAGNOSIS — E782 Mixed hyperlipidemia: Secondary | ICD-10-CM | POA: Diagnosis not present

## 2022-09-04 DIAGNOSIS — E039 Hypothyroidism, unspecified: Secondary | ICD-10-CM | POA: Diagnosis not present

## 2022-09-04 DIAGNOSIS — R296 Repeated falls: Secondary | ICD-10-CM | POA: Diagnosis not present

## 2022-09-04 DIAGNOSIS — N182 Chronic kidney disease, stage 2 (mild): Secondary | ICD-10-CM | POA: Diagnosis not present

## 2022-09-04 DIAGNOSIS — Z9181 History of falling: Secondary | ICD-10-CM | POA: Diagnosis not present

## 2022-09-04 DIAGNOSIS — I129 Hypertensive chronic kidney disease with stage 1 through stage 4 chronic kidney disease, or unspecified chronic kidney disease: Secondary | ICD-10-CM | POA: Diagnosis not present

## 2022-09-04 DIAGNOSIS — E1142 Type 2 diabetes mellitus with diabetic polyneuropathy: Secondary | ICD-10-CM | POA: Diagnosis not present

## 2022-09-11 DIAGNOSIS — Z9181 History of falling: Secondary | ICD-10-CM | POA: Diagnosis not present

## 2022-09-11 DIAGNOSIS — E782 Mixed hyperlipidemia: Secondary | ICD-10-CM | POA: Diagnosis not present

## 2022-09-11 DIAGNOSIS — E1142 Type 2 diabetes mellitus with diabetic polyneuropathy: Secondary | ICD-10-CM | POA: Diagnosis not present

## 2022-09-11 DIAGNOSIS — Z7982 Long term (current) use of aspirin: Secondary | ICD-10-CM | POA: Diagnosis not present

## 2022-09-11 DIAGNOSIS — E039 Hypothyroidism, unspecified: Secondary | ICD-10-CM | POA: Diagnosis not present

## 2022-09-11 DIAGNOSIS — E1122 Type 2 diabetes mellitus with diabetic chronic kidney disease: Secondary | ICD-10-CM | POA: Diagnosis not present

## 2022-09-11 DIAGNOSIS — N182 Chronic kidney disease, stage 2 (mild): Secondary | ICD-10-CM | POA: Diagnosis not present

## 2022-09-11 DIAGNOSIS — I129 Hypertensive chronic kidney disease with stage 1 through stage 4 chronic kidney disease, or unspecified chronic kidney disease: Secondary | ICD-10-CM | POA: Diagnosis not present

## 2022-09-11 DIAGNOSIS — R296 Repeated falls: Secondary | ICD-10-CM | POA: Diagnosis not present

## 2022-09-13 DIAGNOSIS — B3731 Acute candidiasis of vulva and vagina: Secondary | ICD-10-CM | POA: Diagnosis not present

## 2022-09-13 DIAGNOSIS — R410 Disorientation, unspecified: Secondary | ICD-10-CM | POA: Diagnosis not present

## 2022-09-18 DIAGNOSIS — R300 Dysuria: Secondary | ICD-10-CM | POA: Diagnosis not present

## 2022-09-19 DIAGNOSIS — Z7982 Long term (current) use of aspirin: Secondary | ICD-10-CM | POA: Diagnosis not present

## 2022-09-19 DIAGNOSIS — Z9181 History of falling: Secondary | ICD-10-CM | POA: Diagnosis not present

## 2022-09-19 DIAGNOSIS — E1142 Type 2 diabetes mellitus with diabetic polyneuropathy: Secondary | ICD-10-CM | POA: Diagnosis not present

## 2022-09-19 DIAGNOSIS — R296 Repeated falls: Secondary | ICD-10-CM | POA: Diagnosis not present

## 2022-09-19 DIAGNOSIS — I129 Hypertensive chronic kidney disease with stage 1 through stage 4 chronic kidney disease, or unspecified chronic kidney disease: Secondary | ICD-10-CM | POA: Diagnosis not present

## 2022-09-19 DIAGNOSIS — E1122 Type 2 diabetes mellitus with diabetic chronic kidney disease: Secondary | ICD-10-CM | POA: Diagnosis not present

## 2022-09-19 DIAGNOSIS — N182 Chronic kidney disease, stage 2 (mild): Secondary | ICD-10-CM | POA: Diagnosis not present

## 2022-09-19 DIAGNOSIS — E039 Hypothyroidism, unspecified: Secondary | ICD-10-CM | POA: Diagnosis not present

## 2022-09-19 DIAGNOSIS — E782 Mixed hyperlipidemia: Secondary | ICD-10-CM | POA: Diagnosis not present

## 2022-09-19 DIAGNOSIS — G912 (Idiopathic) normal pressure hydrocephalus: Secondary | ICD-10-CM | POA: Diagnosis not present

## 2022-09-20 DIAGNOSIS — E039 Hypothyroidism, unspecified: Secondary | ICD-10-CM | POA: Diagnosis not present

## 2022-09-20 DIAGNOSIS — R269 Unspecified abnormalities of gait and mobility: Secondary | ICD-10-CM | POA: Diagnosis not present

## 2022-09-20 DIAGNOSIS — N182 Chronic kidney disease, stage 2 (mild): Secondary | ICD-10-CM | POA: Diagnosis not present

## 2022-09-20 DIAGNOSIS — I129 Hypertensive chronic kidney disease with stage 1 through stage 4 chronic kidney disease, or unspecified chronic kidney disease: Secondary | ICD-10-CM | POA: Diagnosis not present

## 2022-09-29 DIAGNOSIS — I129 Hypertensive chronic kidney disease with stage 1 through stage 4 chronic kidney disease, or unspecified chronic kidney disease: Secondary | ICD-10-CM | POA: Diagnosis not present

## 2022-09-29 DIAGNOSIS — E782 Mixed hyperlipidemia: Secondary | ICD-10-CM | POA: Diagnosis not present

## 2022-09-29 DIAGNOSIS — E1122 Type 2 diabetes mellitus with diabetic chronic kidney disease: Secondary | ICD-10-CM | POA: Diagnosis not present

## 2022-09-29 DIAGNOSIS — R296 Repeated falls: Secondary | ICD-10-CM | POA: Diagnosis not present

## 2022-09-29 DIAGNOSIS — E039 Hypothyroidism, unspecified: Secondary | ICD-10-CM | POA: Diagnosis not present

## 2022-09-29 DIAGNOSIS — Z9181 History of falling: Secondary | ICD-10-CM | POA: Diagnosis not present

## 2022-09-29 DIAGNOSIS — Z7982 Long term (current) use of aspirin: Secondary | ICD-10-CM | POA: Diagnosis not present

## 2022-09-29 DIAGNOSIS — E1142 Type 2 diabetes mellitus with diabetic polyneuropathy: Secondary | ICD-10-CM | POA: Diagnosis not present

## 2022-09-29 DIAGNOSIS — N182 Chronic kidney disease, stage 2 (mild): Secondary | ICD-10-CM | POA: Diagnosis not present

## 2022-10-02 DIAGNOSIS — N182 Chronic kidney disease, stage 2 (mild): Secondary | ICD-10-CM | POA: Diagnosis not present

## 2022-10-02 DIAGNOSIS — Z9181 History of falling: Secondary | ICD-10-CM | POA: Diagnosis not present

## 2022-10-02 DIAGNOSIS — Z7982 Long term (current) use of aspirin: Secondary | ICD-10-CM | POA: Diagnosis not present

## 2022-10-02 DIAGNOSIS — I129 Hypertensive chronic kidney disease with stage 1 through stage 4 chronic kidney disease, or unspecified chronic kidney disease: Secondary | ICD-10-CM | POA: Diagnosis not present

## 2022-10-02 DIAGNOSIS — R296 Repeated falls: Secondary | ICD-10-CM | POA: Diagnosis not present

## 2022-10-02 DIAGNOSIS — E782 Mixed hyperlipidemia: Secondary | ICD-10-CM | POA: Diagnosis not present

## 2022-10-02 DIAGNOSIS — E039 Hypothyroidism, unspecified: Secondary | ICD-10-CM | POA: Diagnosis not present

## 2022-10-02 DIAGNOSIS — E1122 Type 2 diabetes mellitus with diabetic chronic kidney disease: Secondary | ICD-10-CM | POA: Diagnosis not present

## 2022-10-02 DIAGNOSIS — E1142 Type 2 diabetes mellitus with diabetic polyneuropathy: Secondary | ICD-10-CM | POA: Diagnosis not present

## 2022-10-08 ENCOUNTER — Encounter: Payer: Self-pay | Admitting: Neurology

## 2022-10-09 DIAGNOSIS — E1142 Type 2 diabetes mellitus with diabetic polyneuropathy: Secondary | ICD-10-CM | POA: Diagnosis not present

## 2022-10-09 DIAGNOSIS — R296 Repeated falls: Secondary | ICD-10-CM | POA: Diagnosis not present

## 2022-10-09 DIAGNOSIS — N182 Chronic kidney disease, stage 2 (mild): Secondary | ICD-10-CM | POA: Diagnosis not present

## 2022-10-09 DIAGNOSIS — Z7982 Long term (current) use of aspirin: Secondary | ICD-10-CM | POA: Diagnosis not present

## 2022-10-09 DIAGNOSIS — E1122 Type 2 diabetes mellitus with diabetic chronic kidney disease: Secondary | ICD-10-CM | POA: Diagnosis not present

## 2022-10-09 DIAGNOSIS — E039 Hypothyroidism, unspecified: Secondary | ICD-10-CM | POA: Diagnosis not present

## 2022-10-09 DIAGNOSIS — I129 Hypertensive chronic kidney disease with stage 1 through stage 4 chronic kidney disease, or unspecified chronic kidney disease: Secondary | ICD-10-CM | POA: Diagnosis not present

## 2022-10-09 DIAGNOSIS — E782 Mixed hyperlipidemia: Secondary | ICD-10-CM | POA: Diagnosis not present

## 2022-10-09 DIAGNOSIS — Z9181 History of falling: Secondary | ICD-10-CM | POA: Diagnosis not present

## 2022-10-10 ENCOUNTER — Telehealth: Payer: Self-pay | Admitting: Neurology

## 2022-10-10 ENCOUNTER — Ambulatory Visit: Payer: Medicare Other | Admitting: Neurology

## 2022-10-10 ENCOUNTER — Encounter: Payer: Self-pay | Admitting: Neurology

## 2022-10-10 VITALS — BP 122/80 | HR 86 | Ht 67.0 in

## 2022-10-10 DIAGNOSIS — G9389 Other specified disorders of brain: Secondary | ICD-10-CM

## 2022-10-10 DIAGNOSIS — R269 Unspecified abnormalities of gait and mobility: Secondary | ICD-10-CM | POA: Diagnosis not present

## 2022-10-10 DIAGNOSIS — G609 Hereditary and idiopathic neuropathy, unspecified: Secondary | ICD-10-CM

## 2022-10-10 NOTE — Progress Notes (Signed)
Patient: Margaret Francis Date of Birth: 05-25-1930  Reason for Visit: Follow up History from: Patient, Margaret Francis: caregiver  Primary Neurologist: Dr. Julius Bowels. [facility]   ASSESSMENT AND PLAN 89 y.o. year old female  47.  Gait disturbance 2.  Peripheral neuropathy 3.  Ventriculomegaly  4.  Memory change, likely Dementia  [name] is now living in an assisted living facility.  She has had several falls.  We discussed she will continue PT at the facility, continue with fall prevention.  Seems less likely her gait abnormality is primarily due to NPH, need to consider other factors such as peripheral neuropathy, aging, deconditioning.  When reviewing CT head with Dr. Leta Baptist, really no significant change compared to prior, she does have more atrophy.  Her MMSE 16/30 today. We talked about memory medication, but worry about side effect with Aricept and Namenda. Will continue to monitor. They will follow up in 6 months with Dr. Leta Baptist, they would like to see a neurologist.   HISTORY OF PRESENT ILLNESS: Today 10/10/22 Here today with friend, Margaret Francis. [place]oved [place] in June [place] after Norovirus. Living at [facility]. Continues with PT. Has had 11 falls from Margaret Francis. Facility restricts her from independent mobility. Has moved to a bigger room recently, that is more handicap accessible. May take a nap, wake up disoriented, moving from home of 64 years, few different rooms at facility. Takes a nap in the afternoon, can be confused after, thinks she slept all night in the chair. Had fallen went to the ER November 2023, CT showed bilateral nasal bone fractures. Has urinary incontinence. Mentions memory issues. In the past has not wanted to consider a shunt. Also has peripheral neuropathy that contributes. MMSE 16/30. I reviewed CT head in November with Dr. Leta Baptist, no significant change in 10 years in regards to NPH, there is more atrophy. Seems her gait is less likely completely explained by  NPH.  4/45/23 SS: [name] is here today for follow-up. Has caregivers with her in the morning, afternoon. Lives alone. Last Wednesday had a fall, got a skin tear to left shin. Uses walker, walls to hold on. Generally has a fall once monthly. Has wheelchair when she goes out. Would like to do h0ome health PT again. Had great benefit in past. Takes Myrbetriq for bladder, helps but still gets up during the night to urinate. There have been no major changes in her condition.   HISTORY  05/30/2021 Dr. Jannifer Franklin: [name] is an 89 year old left-handed white female with a history of a prominent gait disorder.  The patient has been at risk for falls, she has not fallen since last seen.  She underwent physical therapy which ended in July 2022.  She uses a walker for ambulation.  She stays alone at night, she has a bedside commode and does not have to walk to go to the bathroom.  She is on Myrbetriq 50 mg which seems to help her urinary incontinence some. She did fall prior to her last visit, she fractured her right elbow but this healed up on its own, she is not having any pain or discomfort with this.  She comes to this office for further evaluation.  REVIEW OF SYSTEMS: Out of a complete 14 system review of symptoms, the patient complains only of the following symptoms, and all other reviewed systems are negative.  See HPI  ALLERGIES: Allergies  Allergen Reactions   Demerol Nausea And Vomiting   Morphine And Related Nausea And Vomiting  HOME MEDICATIONS: Outpatient Medications Prior to Visit  Medication Sig Dispense Refill   acetaminophen (TYLENOL) 325 MG tablet Take 650 mg by mouth every 6 (six) hours as needed.     aspirin 81 MG tablet Take 81 mg by mouth daily.     Calcium Carbonate Antacid 400 MG CHEW Chew 400 mg by mouth as needed.     CHLOROTHIAZIDE PO Take 12.5 tablets by mouth daily.     Cholecalciferol (VITAMIN D3) 50 MCG (2000 UT) CAPS Take 2,000 Units by mouth daily.      levothyroxine (SYNTHROID, LEVOTHROID) 50 MCG tablet Take 50 mcg by mouth daily.     loperamide (IMODIUM) 2 MG capsule Take 1 capsule (2 mg total) by mouth as needed for diarrhea or loose stools. 30 capsule 0   losartan (COZAAR) 100 MG tablet Take 100 mg by mouth daily.     mirabegron ER (MYRBETRIQ) 50 MG TB24 tablet Take 1 tablet (50 mg total) by mouth daily. 90 tablet 3   mirtazapine (REMERON) 15 MG tablet Take 7.5 mg by mouth daily.     ondansetron (ZOFRAN) 4 MG tablet Take 4 mg by mouth every 8 (eight) hours as needed for nausea or vomiting.     pantoprazole (PROTONIX) 20 MG tablet Take 20 mg by mouth daily.     polyethylene glycol (MIRALAX / GLYCOLAX) 17 g packet Take 17 g by mouth daily.     sertraline (ZOLOFT) 50 MG tablet 1 tablet     ondansetron (ZOFRAN-ODT) 4 MG disintegrating tablet Take 1 tablet (4 mg total) by mouth every 8 (eight) hours as needed for nausea or vomiting. 20 tablet 0   No facility-administered medications prior to visit.    PAST MEDICAL HISTORY: Past Medical History:  Diagnosis Date   Abnormality of gait 03/20/2013   Bilateral foot-drop 07/28/2020   Cerebral ventriculomegaly    Gastroesophageal reflux disease    History of stroke    Right inferior cerebellum   Hypertension    Osteopenia    Peripheral neuropathy 11/29/2020   Stroke [facility])    Thyroid disease    Urinary frequency 04/19/2011   Urinary incontinence     PAST SURGICAL HISTORY: Past Surgical History:  Procedure Laterality Date   ABDOMINAL HYSTERECTOMY     APPENDECTOMY     BACK SURGERY     x2   BLADDER SURGERY  2010   CHOLECYSTECTOMY     ESOPHAGUS SURGERY     tumor removal.   TONSILLECTOMY     at age32   URETHRAL SLING  2010    FAMILY HISTORY: Family History  Problem Relation Age of Onset   Diabetes Mother    Hypertension Mother    Arthritis Father        died at age 54   Breast cancer Sister 26       mastectomy    SOCIAL HISTORY: Social History   Socioeconomic History    Marital status: Widowed    Spouse name: Not on file   Number of children: Not on file   Years of education: Not on file   Highest education level: Not on file  Occupational History   Not on file  Tobacco Use   Smoking status: Never   Smokeless tobacco: Never   Tobacco comments:    while in college  Substance and Sexual Activity   Alcohol use: No   Drug use: No   Sexual activity: Not on file  Other Topics Concern   Not on file  Social History Narrative   Lives alone   Left Handed   Drinks no caffeine   Social Determinants of Health   Financial Resource Strain: Not on file  Food Insecurity: Not on file  Transportation Needs: Not on file  Physical Activity: Not on file  Stress: Not on file  Social Connections: Not on file  Intimate Partner Violence: Not on file   PHYSICAL EXAM  Vitals:   10/10/22 1129  BP: 122/80  Pulse: 86  Height: 5' 7"$  (1.702 m)   Body mass index is 29.76 kg/m.  Generalized: Well developed, in no acute distress  Neurological examination  Mentation: Alert oriented to time, place, history is mostly provided by her friend.  Follows all commands speech and language fluent Cranial nerve II-XII: Pupils were equal round reactive to light. Extraocular movements were full, visual field were full on confrontational test. Facial sensation and strength were normal. Head turning and shoulder shrug were normal and symmetric. Motor: Overall good strength for age Sensory: decreased sensation to soft touch to knee level bilaterally   Coordination: Cerebellar testing reveals good finger-nose-finger bilaterally, hard to perform heel-to-shin Gait and station: In wheelchair, has to rock to try and stand, still not able to stand up independently, goes backward  Reflexes: Deep tendon reflexes are symmetric and decreased   DIAGNOSTIC DATA (LABS, IMAGING, TESTING) - I reviewed patient records, labs, notes, testing and imaging myself where available.  Lab Results   Component Value Date   WBC 8.2 06/29/2022   HGB 13.6 06/29/2022   HCT 41.0 06/29/2022   MCV 90.3 06/29/2022   PLT 260 06/29/2022      Component Value Date/Time   NA 136 06/29/2022 0036   K 4.0 06/29/2022 0036   CL 103 06/29/2022 0036   CO2 24 06/29/2022 0036   GLUCOSE 162 (H) 06/29/2022 0036   BUN 16 06/29/2022 0036   CREATININE 0.85 06/29/2022 0036   CALCIUM 11.1 (H) 06/29/2022 0036   PROT 5.9 (L) 01/04/2022 0429   PROT 6.8 07/28/2020 1002   ALBUMIN 3.0 (L) 01/04/2022 0429   AST 40 01/04/2022 0429   ALT 51 (H) 01/04/2022 0429   ALKPHOS 115 01/04/2022 0429   BILITOT 0.2 (L) 01/04/2022 0429   GFRNONAA >60 06/29/2022 0036   GFRAA >60 02/28/2019 1151   No results found for: "CHOL", "HDL", "LDLCALC", "LDLDIRECT", "TRIG", "CHOLHDL" No results found for: "HGBA1C" Lab Results  Component Value Date   VITAMINB12 437 07/28/2020   No results found for: "TSH"  Butler Denmark, AGNP-C, DNP 10/10/2022, 11:32 AM [facility] [address] [place], [place] [address] [phone]

## 2022-10-10 NOTE — Telephone Encounter (Signed)
Sent mychart msg informing pt of follow up appointment made with Dr. Leta Baptist

## 2022-10-11 DIAGNOSIS — G912 (Idiopathic) normal pressure hydrocephalus: Secondary | ICD-10-CM | POA: Diagnosis not present

## 2022-10-11 DIAGNOSIS — R296 Repeated falls: Secondary | ICD-10-CM | POA: Diagnosis not present

## 2022-10-11 DIAGNOSIS — R269 Unspecified abnormalities of gait and mobility: Secondary | ICD-10-CM | POA: Diagnosis not present

## 2022-10-16 DIAGNOSIS — G912 (Idiopathic) normal pressure hydrocephalus: Secondary | ICD-10-CM | POA: Diagnosis not present

## 2022-10-17 ENCOUNTER — Telehealth: Payer: Self-pay

## 2022-10-17 DIAGNOSIS — E1122 Type 2 diabetes mellitus with diabetic chronic kidney disease: Secondary | ICD-10-CM | POA: Diagnosis not present

## 2022-10-17 DIAGNOSIS — E039 Hypothyroidism, unspecified: Secondary | ICD-10-CM | POA: Diagnosis not present

## 2022-10-17 DIAGNOSIS — Z9181 History of falling: Secondary | ICD-10-CM | POA: Diagnosis not present

## 2022-10-17 DIAGNOSIS — E782 Mixed hyperlipidemia: Secondary | ICD-10-CM | POA: Diagnosis not present

## 2022-10-17 DIAGNOSIS — I129 Hypertensive chronic kidney disease with stage 1 through stage 4 chronic kidney disease, or unspecified chronic kidney disease: Secondary | ICD-10-CM | POA: Diagnosis not present

## 2022-10-17 DIAGNOSIS — E1142 Type 2 diabetes mellitus with diabetic polyneuropathy: Secondary | ICD-10-CM | POA: Diagnosis not present

## 2022-10-17 DIAGNOSIS — N182 Chronic kidney disease, stage 2 (mild): Secondary | ICD-10-CM | POA: Diagnosis not present

## 2022-10-17 DIAGNOSIS — R296 Repeated falls: Secondary | ICD-10-CM | POA: Diagnosis not present

## 2022-10-17 DIAGNOSIS — Z7982 Long term (current) use of aspirin: Secondary | ICD-10-CM | POA: Diagnosis not present

## 2022-10-17 NOTE — Telephone Encounter (Signed)
Faxed visit form to [facility]

## 2022-10-18 DIAGNOSIS — I129 Hypertensive chronic kidney disease with stage 1 through stage 4 chronic kidney disease, or unspecified chronic kidney disease: Secondary | ICD-10-CM | POA: Diagnosis not present

## 2022-10-18 DIAGNOSIS — R296 Repeated falls: Secondary | ICD-10-CM | POA: Diagnosis not present

## 2022-10-18 DIAGNOSIS — G3184 Mild cognitive impairment, so stated: Secondary | ICD-10-CM | POA: Diagnosis not present

## 2022-10-18 DIAGNOSIS — N182 Chronic kidney disease, stage 2 (mild): Secondary | ICD-10-CM | POA: Diagnosis not present

## 2022-10-25 DIAGNOSIS — E782 Mixed hyperlipidemia: Secondary | ICD-10-CM | POA: Diagnosis not present

## 2022-10-25 DIAGNOSIS — E039 Hypothyroidism, unspecified: Secondary | ICD-10-CM | POA: Diagnosis not present

## 2022-10-25 DIAGNOSIS — R296 Repeated falls: Secondary | ICD-10-CM | POA: Diagnosis not present

## 2022-10-25 DIAGNOSIS — E1122 Type 2 diabetes mellitus with diabetic chronic kidney disease: Secondary | ICD-10-CM | POA: Diagnosis not present

## 2022-10-25 DIAGNOSIS — Z7982 Long term (current) use of aspirin: Secondary | ICD-10-CM | POA: Diagnosis not present

## 2022-10-25 DIAGNOSIS — Z9181 History of falling: Secondary | ICD-10-CM | POA: Diagnosis not present

## 2022-10-25 DIAGNOSIS — I129 Hypertensive chronic kidney disease with stage 1 through stage 4 chronic kidney disease, or unspecified chronic kidney disease: Secondary | ICD-10-CM | POA: Diagnosis not present

## 2022-10-25 DIAGNOSIS — E1142 Type 2 diabetes mellitus with diabetic polyneuropathy: Secondary | ICD-10-CM | POA: Diagnosis not present

## 2022-10-25 DIAGNOSIS — N182 Chronic kidney disease, stage 2 (mild): Secondary | ICD-10-CM | POA: Diagnosis not present

## 2022-11-03 DIAGNOSIS — Z9181 History of falling: Secondary | ICD-10-CM | POA: Diagnosis not present

## 2022-11-03 DIAGNOSIS — Z7982 Long term (current) use of aspirin: Secondary | ICD-10-CM | POA: Diagnosis not present

## 2022-11-03 DIAGNOSIS — E1142 Type 2 diabetes mellitus with diabetic polyneuropathy: Secondary | ICD-10-CM | POA: Diagnosis not present

## 2022-11-03 DIAGNOSIS — R296 Repeated falls: Secondary | ICD-10-CM | POA: Diagnosis not present

## 2022-11-03 DIAGNOSIS — N182 Chronic kidney disease, stage 2 (mild): Secondary | ICD-10-CM | POA: Diagnosis not present

## 2022-11-03 DIAGNOSIS — E782 Mixed hyperlipidemia: Secondary | ICD-10-CM | POA: Diagnosis not present

## 2022-11-03 DIAGNOSIS — E039 Hypothyroidism, unspecified: Secondary | ICD-10-CM | POA: Diagnosis not present

## 2022-11-03 DIAGNOSIS — I129 Hypertensive chronic kidney disease with stage 1 through stage 4 chronic kidney disease, or unspecified chronic kidney disease: Secondary | ICD-10-CM | POA: Diagnosis not present

## 2022-11-03 DIAGNOSIS — E1122 Type 2 diabetes mellitus with diabetic chronic kidney disease: Secondary | ICD-10-CM | POA: Diagnosis not present

## 2022-11-08 DIAGNOSIS — E039 Hypothyroidism, unspecified: Secondary | ICD-10-CM | POA: Diagnosis not present

## 2022-11-08 DIAGNOSIS — Z7982 Long term (current) use of aspirin: Secondary | ICD-10-CM | POA: Diagnosis not present

## 2022-11-08 DIAGNOSIS — N182 Chronic kidney disease, stage 2 (mild): Secondary | ICD-10-CM | POA: Diagnosis not present

## 2022-11-08 DIAGNOSIS — E1142 Type 2 diabetes mellitus with diabetic polyneuropathy: Secondary | ICD-10-CM | POA: Diagnosis not present

## 2022-11-08 DIAGNOSIS — Z9181 History of falling: Secondary | ICD-10-CM | POA: Diagnosis not present

## 2022-11-08 DIAGNOSIS — I129 Hypertensive chronic kidney disease with stage 1 through stage 4 chronic kidney disease, or unspecified chronic kidney disease: Secondary | ICD-10-CM | POA: Diagnosis not present

## 2022-11-08 DIAGNOSIS — R296 Repeated falls: Secondary | ICD-10-CM | POA: Diagnosis not present

## 2022-11-08 DIAGNOSIS — E1122 Type 2 diabetes mellitus with diabetic chronic kidney disease: Secondary | ICD-10-CM | POA: Diagnosis not present

## 2022-11-08 DIAGNOSIS — E782 Mixed hyperlipidemia: Secondary | ICD-10-CM | POA: Diagnosis not present

## 2022-11-14 ENCOUNTER — Telehealth: Payer: Self-pay

## 2022-11-14 DIAGNOSIS — G912 (Idiopathic) normal pressure hydrocephalus: Secondary | ICD-10-CM | POA: Diagnosis not present

## 2022-11-14 DIAGNOSIS — E038 Other specified hypothyroidism: Secondary | ICD-10-CM | POA: Diagnosis not present

## 2022-11-14 NOTE — Telephone Encounter (Signed)
Called pt to confirm appointment for tomorrow.  Left message for pt to call and confirm appointment 

## 2022-11-15 ENCOUNTER — Encounter: Payer: Medicare Other | Admitting: Family Medicine

## 2022-11-15 DIAGNOSIS — R269 Unspecified abnormalities of gait and mobility: Secondary | ICD-10-CM | POA: Diagnosis not present

## 2022-11-15 DIAGNOSIS — N182 Chronic kidney disease, stage 2 (mild): Secondary | ICD-10-CM | POA: Diagnosis not present

## 2022-11-15 DIAGNOSIS — I129 Hypertensive chronic kidney disease with stage 1 through stage 4 chronic kidney disease, or unspecified chronic kidney disease: Secondary | ICD-10-CM | POA: Diagnosis not present

## 2022-11-15 DIAGNOSIS — K5900 Constipation, unspecified: Secondary | ICD-10-CM | POA: Diagnosis not present

## 2022-11-15 DIAGNOSIS — E039 Hypothyroidism, unspecified: Secondary | ICD-10-CM | POA: Diagnosis not present

## 2022-11-15 DIAGNOSIS — K219 Gastro-esophageal reflux disease without esophagitis: Secondary | ICD-10-CM | POA: Diagnosis not present

## 2022-11-16 ENCOUNTER — Ambulatory Visit: Payer: Medicare Other | Admitting: Obstetrics and Gynecology

## 2022-11-16 ENCOUNTER — Encounter: Payer: Self-pay | Admitting: Obstetrics and Gynecology

## 2022-11-16 VITALS — BP 133/85 | HR 80

## 2022-11-16 DIAGNOSIS — N898 Other specified noninflammatory disorders of vagina: Secondary | ICD-10-CM | POA: Diagnosis not present

## 2022-11-16 NOTE — Progress Notes (Signed)
Obstetrics and Gynecology New Patient Evaluation  Appointment Date: 11/16/2022  OBGYN Clinic: Center for [facility]  Primary Care Provider: Thea Gist  Referring Provider: Thea Gist, NP  Chief Complaint:  Chief Complaint  Patient presents with   Vaginitis    History of Present Illness: Margaret Francis is a 89 y.o. with above CC.  Patient had UTI treated a few times in Dec/Jan and since then she has had vaginal itching refractory to her PCPs diflucan and monistat; pt lives at a nursing home and is here with her friend. She wears a depends at the nursing home and has OAB s/s. Last UTI check negative.   No vaginal bleeding, discharge  Review of Systems: Pertinent items noted in HPI and remainder of comprehensive ROS otherwise negative.   Patient Active Problem List   Diagnosis Date Noted   Overweight (BMI 25.0-29.9) 01/04/2022   Hypertension    History of stroke    Thyroid disease    Intestinal infection, enteritis due to rotavirus    Transaminitis    Frequent falls    Peripheral neuropathy 11/29/2020   Cerebral ventriculomegaly 11/29/2020   Bilateral foot-drop 07/28/2020   Abnormality of gait 03/20/2013   Urinary frequency 04/19/2011    Past Medical History:  Past Medical History:  Diagnosis Date   Abnormality of gait 03/20/2013   Bilateral foot-drop 07/28/2020   Cerebral ventriculomegaly    Gastroesophageal reflux disease    History of stroke    Right inferior cerebellum   Hypertension    Osteopenia    Peripheral neuropathy 11/29/2020   Stroke [facility])    Thyroid disease    Urinary frequency 04/19/2011   Urinary incontinence     Past Surgical History:  Past Surgical History:  Procedure Laterality Date   ABDOMINAL HYSTERECTOMY     APPENDECTOMY     BACK SURGERY     x2   BLADDER SURGERY  2010   CHOLECYSTECTOMY     ESOPHAGUS SURGERY     tumor removal.   TONSILLECTOMY     at [facility]  2010    Past Ob/Gyn  History: as above  Social History:  Social History   Socioeconomic History   Marital status: Widowed    Spouse name: Not on file   Number of children: Not on file   Years of education: Not on file   Highest education level: Not on file  Occupational History   Not on file  Tobacco Use   Smoking status: Never   Smokeless tobacco: Never   Tobacco comments:    while in college  Substance and Sexual Activity   Alcohol use: No   Drug use: No   Sexual activity: Not on file  Other Topics Concern   Not on file  Social History Narrative   Lives alone   Left Handed   Drinks no caffeine   Social Determinants of Health   Financial Resource Strain: Not on file  Food Insecurity: Not on file  Transportation Needs: Not on file  Physical Activity: Not on file  Stress: Not on file  Social Connections: Not on file  Intimate Partner Violence: Not on file    Family History:  Family History  Problem Relation Age of Onset   Diabetes Mother    Hypertension Mother    Arthritis Father        died at age 53   Breast cancer Sister 61       mastectomy  Medications Carnetta C. Knerr had no medications administered during this visit. Current Outpatient Medications  Medication Sig Dispense Refill   acetaminophen (TYLENOL) 325 MG tablet Take 650 mg by mouth every 6 (six) hours as needed.     aspirin 81 MG tablet Take 81 mg by mouth daily.     Calcium Carbonate Antacid 400 MG CHEW Chew 400 mg by mouth as needed.     CHLOROTHIAZIDE PO Take 12.5 tablets by mouth daily.     Cholecalciferol (VITAMIN D3) 50 MCG (2000 UT) CAPS Take 2,000 Units by mouth daily.     levothyroxine (SYNTHROID, LEVOTHROID) 50 MCG tablet Take 50 mcg by mouth daily.     loperamide (IMODIUM) 2 MG capsule Take 1 capsule (2 mg total) by mouth as needed for diarrhea or loose stools. 30 capsule 0   losartan (COZAAR) 100 MG tablet Take 100 mg by mouth daily.     mirabegron ER (MYRBETRIQ) 50 MG TB24 tablet Take 1 tablet (50  mg total) by mouth daily. 90 tablet 3   mirtazapine (REMERON) 15 MG tablet Take 7.5 mg by mouth daily.     ondansetron (ZOFRAN) 4 MG tablet Take 4 mg by mouth every 8 (eight) hours as needed for nausea or vomiting.     pantoprazole (PROTONIX) 20 MG tablet Take 20 mg by mouth daily.     polyethylene glycol (MIRALAX / GLYCOLAX) 17 g packet Take 17 g by mouth daily.     sertraline (ZOLOFT) 50 MG tablet 1 tablet     No current facility-administered medications for this visit.    Allergies Demerol and Morphine and related   Physical Exam:  BP 133/85   Pulse 80  There is no height or weight on file to calculate BMI. General appearance: Well nourished, well developed female in no acute distress.  Respiratory:   Normal respiratory effort Abdomen: soft, nttp, nd Neuro/Psych:  Normal mood and affect.  Skin:  Warm and dry.  Lymphatic:  No inguinal lymphadenopathy.   Cervical exam performed in the presence of a chaperone Pelvic exam: is not limited by body habitus EGBUS: mild b/l erythema on the labia majora and near rectum. Labia minora, introitus wnl except for white cottage cheese like d/c. Has not used any vaginal medications for a few days at least  Laboratory: none  Radiology: none  Assessment: patient stable  Plan:  1. Vaginal itching F/u swab testing. May need to be on maintenance therapy and/or vaginal medications.  - NuSwab Vaginitis Plus (VG+)  Orders Placed This Encounter  Procedures   NuSwab Vaginitis Plus (VG+)    RTC 2 months  Return in about 2 months (around 01/16/2023) for 6-8wks, in person, with dr Ilda Basset, md visit.  Future Appointments  Date Time Provider [place]  01/19/2023  8:55 AM Aletha Halim, MD [facility]  04/12/2023  3:45 PM Penumalli, Earlean Polka, MD [facility] None    Durene Romans MD Attending Center for [facility])

## 2022-11-16 NOTE — Progress Notes (Signed)
NGYN patient here for problem visit today.   Recurrent UTI's and vaginal yeast notes sx's of itching.   Has tried Rx treatment for both yet sx's return.  Pt states the vaginal itching is more on the outside and urgency.

## 2022-11-17 DIAGNOSIS — N182 Chronic kidney disease, stage 2 (mild): Secondary | ICD-10-CM | POA: Diagnosis not present

## 2022-11-17 DIAGNOSIS — R296 Repeated falls: Secondary | ICD-10-CM | POA: Diagnosis not present

## 2022-11-17 DIAGNOSIS — Z9181 History of falling: Secondary | ICD-10-CM | POA: Diagnosis not present

## 2022-11-17 DIAGNOSIS — Z7982 Long term (current) use of aspirin: Secondary | ICD-10-CM | POA: Diagnosis not present

## 2022-11-17 DIAGNOSIS — E782 Mixed hyperlipidemia: Secondary | ICD-10-CM | POA: Diagnosis not present

## 2022-11-17 DIAGNOSIS — E039 Hypothyroidism, unspecified: Secondary | ICD-10-CM | POA: Diagnosis not present

## 2022-11-17 DIAGNOSIS — E1142 Type 2 diabetes mellitus with diabetic polyneuropathy: Secondary | ICD-10-CM | POA: Diagnosis not present

## 2022-11-17 DIAGNOSIS — E1122 Type 2 diabetes mellitus with diabetic chronic kidney disease: Secondary | ICD-10-CM | POA: Diagnosis not present

## 2022-11-17 DIAGNOSIS — I129 Hypertensive chronic kidney disease with stage 1 through stage 4 chronic kidney disease, or unspecified chronic kidney disease: Secondary | ICD-10-CM | POA: Diagnosis not present

## 2022-11-20 LAB — NUSWAB VAGINITIS PLUS (VG+)
Candida albicans, NAA: NEGATIVE
Candida glabrata, NAA: NEGATIVE
Chlamydia trachomatis, NAA: NEGATIVE
Neisseria gonorrhoeae, NAA: NEGATIVE
Trich vag by NAA: NEGATIVE

## 2022-11-22 DIAGNOSIS — N182 Chronic kidney disease, stage 2 (mild): Secondary | ICD-10-CM | POA: Diagnosis not present

## 2022-11-22 DIAGNOSIS — E1122 Type 2 diabetes mellitus with diabetic chronic kidney disease: Secondary | ICD-10-CM | POA: Diagnosis not present

## 2022-11-22 DIAGNOSIS — I129 Hypertensive chronic kidney disease with stage 1 through stage 4 chronic kidney disease, or unspecified chronic kidney disease: Secondary | ICD-10-CM | POA: Diagnosis not present

## 2022-11-22 DIAGNOSIS — E782 Mixed hyperlipidemia: Secondary | ICD-10-CM | POA: Diagnosis not present

## 2022-11-22 DIAGNOSIS — Z7982 Long term (current) use of aspirin: Secondary | ICD-10-CM | POA: Diagnosis not present

## 2022-11-22 DIAGNOSIS — E1142 Type 2 diabetes mellitus with diabetic polyneuropathy: Secondary | ICD-10-CM | POA: Diagnosis not present

## 2022-11-22 DIAGNOSIS — E039 Hypothyroidism, unspecified: Secondary | ICD-10-CM | POA: Diagnosis not present

## 2022-11-22 DIAGNOSIS — R296 Repeated falls: Secondary | ICD-10-CM | POA: Diagnosis not present

## 2022-11-22 DIAGNOSIS — Z9181 History of falling: Secondary | ICD-10-CM | POA: Diagnosis not present

## 2022-11-27 DIAGNOSIS — N182 Chronic kidney disease, stage 2 (mild): Secondary | ICD-10-CM | POA: Diagnosis not present

## 2022-11-27 DIAGNOSIS — Z9181 History of falling: Secondary | ICD-10-CM | POA: Diagnosis not present

## 2022-11-27 DIAGNOSIS — Z7982 Long term (current) use of aspirin: Secondary | ICD-10-CM | POA: Diagnosis not present

## 2022-11-27 DIAGNOSIS — E1122 Type 2 diabetes mellitus with diabetic chronic kidney disease: Secondary | ICD-10-CM | POA: Diagnosis not present

## 2022-11-27 DIAGNOSIS — E782 Mixed hyperlipidemia: Secondary | ICD-10-CM | POA: Diagnosis not present

## 2022-11-27 DIAGNOSIS — E039 Hypothyroidism, unspecified: Secondary | ICD-10-CM | POA: Diagnosis not present

## 2022-11-27 DIAGNOSIS — E1142 Type 2 diabetes mellitus with diabetic polyneuropathy: Secondary | ICD-10-CM | POA: Diagnosis not present

## 2022-11-27 DIAGNOSIS — I129 Hypertensive chronic kidney disease with stage 1 through stage 4 chronic kidney disease, or unspecified chronic kidney disease: Secondary | ICD-10-CM | POA: Diagnosis not present

## 2022-11-27 DIAGNOSIS — R296 Repeated falls: Secondary | ICD-10-CM | POA: Diagnosis not present

## 2022-11-30 ENCOUNTER — Other Ambulatory Visit: Payer: Self-pay | Admitting: Obstetrics and Gynecology

## 2022-11-30 DIAGNOSIS — G629 Polyneuropathy, unspecified: Secondary | ICD-10-CM | POA: Diagnosis not present

## 2022-11-30 DIAGNOSIS — R410 Disorientation, unspecified: Secondary | ICD-10-CM | POA: Diagnosis not present

## 2022-11-30 DIAGNOSIS — Z8673 Personal history of transient ischemic attack (TIA), and cerebral infarction without residual deficits: Secondary | ICD-10-CM | POA: Diagnosis not present

## 2022-11-30 MED ORDER — CLOBETASOL PROPIONATE 0.05 % EX OINT: 1.0000 | TOPICAL_OINTMENT | Freq: Every day | CUTANEOUS | 2 refills | Status: DC

## 2022-12-01 DIAGNOSIS — K219 Gastro-esophageal reflux disease without esophagitis: Secondary | ICD-10-CM | POA: Diagnosis not present

## 2022-12-01 DIAGNOSIS — I10 Essential (primary) hypertension: Secondary | ICD-10-CM | POA: Diagnosis not present

## 2022-12-01 DIAGNOSIS — E038 Other specified hypothyroidism: Secondary | ICD-10-CM | POA: Diagnosis not present

## 2022-12-01 DIAGNOSIS — M6281 Muscle weakness (generalized): Secondary | ICD-10-CM | POA: Diagnosis not present

## 2022-12-04 ENCOUNTER — Telehealth: Payer: Self-pay

## 2022-12-04 NOTE — Telephone Encounter (Signed)
Left a message on voicemail to return the call to the office

## 2022-12-06 DIAGNOSIS — R269 Unspecified abnormalities of gait and mobility: Secondary | ICD-10-CM | POA: Diagnosis not present

## 2022-12-06 DIAGNOSIS — K5901 Slow transit constipation: Secondary | ICD-10-CM | POA: Diagnosis not present

## 2022-12-06 DIAGNOSIS — I129 Hypertensive chronic kidney disease with stage 1 through stage 4 chronic kidney disease, or unspecified chronic kidney disease: Secondary | ICD-10-CM | POA: Diagnosis not present

## 2022-12-06 DIAGNOSIS — Z9181 History of falling: Secondary | ICD-10-CM | POA: Diagnosis not present

## 2022-12-06 DIAGNOSIS — E039 Hypothyroidism, unspecified: Secondary | ICD-10-CM | POA: Diagnosis not present

## 2022-12-06 DIAGNOSIS — N182 Chronic kidney disease, stage 2 (mild): Secondary | ICD-10-CM | POA: Diagnosis not present

## 2022-12-06 DIAGNOSIS — E1142 Type 2 diabetes mellitus with diabetic polyneuropathy: Secondary | ICD-10-CM | POA: Diagnosis not present

## 2022-12-06 DIAGNOSIS — K219 Gastro-esophageal reflux disease without esophagitis: Secondary | ICD-10-CM | POA: Diagnosis not present

## 2022-12-06 DIAGNOSIS — R296 Repeated falls: Secondary | ICD-10-CM | POA: Diagnosis not present

## 2022-12-06 DIAGNOSIS — E782 Mixed hyperlipidemia: Secondary | ICD-10-CM | POA: Diagnosis not present

## 2022-12-06 DIAGNOSIS — E1122 Type 2 diabetes mellitus with diabetic chronic kidney disease: Secondary | ICD-10-CM | POA: Diagnosis not present

## 2022-12-06 DIAGNOSIS — Z7982 Long term (current) use of aspirin: Secondary | ICD-10-CM | POA: Diagnosis not present

## 2022-12-11 DIAGNOSIS — E1142 Type 2 diabetes mellitus with diabetic polyneuropathy: Secondary | ICD-10-CM | POA: Diagnosis not present

## 2022-12-11 DIAGNOSIS — N182 Chronic kidney disease, stage 2 (mild): Secondary | ICD-10-CM | POA: Diagnosis not present

## 2022-12-11 DIAGNOSIS — E1122 Type 2 diabetes mellitus with diabetic chronic kidney disease: Secondary | ICD-10-CM | POA: Diagnosis not present

## 2022-12-11 DIAGNOSIS — E782 Mixed hyperlipidemia: Secondary | ICD-10-CM | POA: Diagnosis not present

## 2022-12-11 DIAGNOSIS — I129 Hypertensive chronic kidney disease with stage 1 through stage 4 chronic kidney disease, or unspecified chronic kidney disease: Secondary | ICD-10-CM | POA: Diagnosis not present

## 2022-12-11 DIAGNOSIS — R296 Repeated falls: Secondary | ICD-10-CM | POA: Diagnosis not present

## 2022-12-11 DIAGNOSIS — E039 Hypothyroidism, unspecified: Secondary | ICD-10-CM | POA: Diagnosis not present

## 2022-12-11 DIAGNOSIS — Z7982 Long term (current) use of aspirin: Secondary | ICD-10-CM | POA: Diagnosis not present

## 2022-12-11 DIAGNOSIS — Z9181 History of falling: Secondary | ICD-10-CM | POA: Diagnosis not present

## 2022-12-13 DIAGNOSIS — I7091 Generalized atherosclerosis: Secondary | ICD-10-CM | POA: Diagnosis not present

## 2022-12-14 ENCOUNTER — Telehealth: Payer: Self-pay | Admitting: Diagnostic Neuroimaging

## 2022-12-14 ENCOUNTER — Ambulatory Visit: Payer: Medicare Other | Admitting: Neurology

## 2022-12-14 DIAGNOSIS — R051 Acute cough: Secondary | ICD-10-CM | POA: Diagnosis not present

## 2022-12-14 DIAGNOSIS — J209 Acute bronchitis, unspecified: Secondary | ICD-10-CM | POA: Diagnosis not present

## 2022-12-14 NOTE — Telephone Encounter (Signed)
Sent mychart msg informing pt of appt change due to provider template change 

## 2022-12-18 DIAGNOSIS — N182 Chronic kidney disease, stage 2 (mild): Secondary | ICD-10-CM | POA: Diagnosis not present

## 2022-12-18 DIAGNOSIS — Z7982 Long term (current) use of aspirin: Secondary | ICD-10-CM | POA: Diagnosis not present

## 2022-12-18 DIAGNOSIS — E782 Mixed hyperlipidemia: Secondary | ICD-10-CM | POA: Diagnosis not present

## 2022-12-18 DIAGNOSIS — E039 Hypothyroidism, unspecified: Secondary | ICD-10-CM | POA: Diagnosis not present

## 2022-12-18 DIAGNOSIS — R296 Repeated falls: Secondary | ICD-10-CM | POA: Diagnosis not present

## 2022-12-18 DIAGNOSIS — Z9181 History of falling: Secondary | ICD-10-CM | POA: Diagnosis not present

## 2022-12-18 DIAGNOSIS — E1122 Type 2 diabetes mellitus with diabetic chronic kidney disease: Secondary | ICD-10-CM | POA: Diagnosis not present

## 2022-12-18 DIAGNOSIS — E1142 Type 2 diabetes mellitus with diabetic polyneuropathy: Secondary | ICD-10-CM | POA: Diagnosis not present

## 2022-12-18 DIAGNOSIS — I129 Hypertensive chronic kidney disease with stage 1 through stage 4 chronic kidney disease, or unspecified chronic kidney disease: Secondary | ICD-10-CM | POA: Diagnosis not present

## 2022-12-19 DIAGNOSIS — I129 Hypertensive chronic kidney disease with stage 1 through stage 4 chronic kidney disease, or unspecified chronic kidney disease: Secondary | ICD-10-CM | POA: Diagnosis not present

## 2022-12-19 DIAGNOSIS — E1142 Type 2 diabetes mellitus with diabetic polyneuropathy: Secondary | ICD-10-CM | POA: Diagnosis not present

## 2022-12-19 DIAGNOSIS — R296 Repeated falls: Secondary | ICD-10-CM | POA: Diagnosis not present

## 2022-12-19 DIAGNOSIS — G912 (Idiopathic) normal pressure hydrocephalus: Secondary | ICD-10-CM | POA: Diagnosis not present

## 2022-12-19 DIAGNOSIS — E038 Other specified hypothyroidism: Secondary | ICD-10-CM | POA: Diagnosis not present

## 2022-12-20 DIAGNOSIS — R051 Acute cough: Secondary | ICD-10-CM | POA: Diagnosis not present

## 2022-12-20 DIAGNOSIS — J209 Acute bronchitis, unspecified: Secondary | ICD-10-CM | POA: Diagnosis not present

## 2022-12-25 DIAGNOSIS — I129 Hypertensive chronic kidney disease with stage 1 through stage 4 chronic kidney disease, or unspecified chronic kidney disease: Secondary | ICD-10-CM | POA: Diagnosis not present

## 2022-12-25 DIAGNOSIS — Z9181 History of falling: Secondary | ICD-10-CM | POA: Diagnosis not present

## 2022-12-25 DIAGNOSIS — E039 Hypothyroidism, unspecified: Secondary | ICD-10-CM | POA: Diagnosis not present

## 2022-12-25 DIAGNOSIS — N182 Chronic kidney disease, stage 2 (mild): Secondary | ICD-10-CM | POA: Diagnosis not present

## 2022-12-25 DIAGNOSIS — R296 Repeated falls: Secondary | ICD-10-CM | POA: Diagnosis not present

## 2022-12-25 DIAGNOSIS — Z7982 Long term (current) use of aspirin: Secondary | ICD-10-CM | POA: Diagnosis not present

## 2022-12-25 DIAGNOSIS — E1142 Type 2 diabetes mellitus with diabetic polyneuropathy: Secondary | ICD-10-CM | POA: Diagnosis not present

## 2022-12-25 DIAGNOSIS — E1122 Type 2 diabetes mellitus with diabetic chronic kidney disease: Secondary | ICD-10-CM | POA: Diagnosis not present

## 2022-12-25 DIAGNOSIS — E782 Mixed hyperlipidemia: Secondary | ICD-10-CM | POA: Diagnosis not present

## 2022-12-29 DIAGNOSIS — R410 Disorientation, unspecified: Secondary | ICD-10-CM | POA: Diagnosis not present

## 2022-12-29 DIAGNOSIS — Z8673 Personal history of transient ischemic attack (TIA), and cerebral infarction without residual deficits: Secondary | ICD-10-CM | POA: Diagnosis not present

## 2023-01-03 DIAGNOSIS — Z9181 History of falling: Secondary | ICD-10-CM | POA: Diagnosis not present

## 2023-01-03 DIAGNOSIS — Z7982 Long term (current) use of aspirin: Secondary | ICD-10-CM | POA: Diagnosis not present

## 2023-01-03 DIAGNOSIS — N182 Chronic kidney disease, stage 2 (mild): Secondary | ICD-10-CM | POA: Diagnosis not present

## 2023-01-03 DIAGNOSIS — E782 Mixed hyperlipidemia: Secondary | ICD-10-CM | POA: Diagnosis not present

## 2023-01-03 DIAGNOSIS — G3184 Mild cognitive impairment, so stated: Secondary | ICD-10-CM | POA: Diagnosis not present

## 2023-01-03 DIAGNOSIS — R279 Unspecified lack of coordination: Secondary | ICD-10-CM | POA: Diagnosis not present

## 2023-01-03 DIAGNOSIS — E039 Hypothyroidism, unspecified: Secondary | ICD-10-CM | POA: Diagnosis not present

## 2023-01-03 DIAGNOSIS — E1142 Type 2 diabetes mellitus with diabetic polyneuropathy: Secondary | ICD-10-CM | POA: Diagnosis not present

## 2023-01-03 DIAGNOSIS — I129 Hypertensive chronic kidney disease with stage 1 through stage 4 chronic kidney disease, or unspecified chronic kidney disease: Secondary | ICD-10-CM | POA: Diagnosis not present

## 2023-01-03 DIAGNOSIS — I739 Peripheral vascular disease, unspecified: Secondary | ICD-10-CM | POA: Diagnosis not present

## 2023-01-03 DIAGNOSIS — E1122 Type 2 diabetes mellitus with diabetic chronic kidney disease: Secondary | ICD-10-CM | POA: Diagnosis not present

## 2023-01-03 DIAGNOSIS — R296 Repeated falls: Secondary | ICD-10-CM | POA: Diagnosis not present

## 2023-01-08 DIAGNOSIS — G912 (Idiopathic) normal pressure hydrocephalus: Secondary | ICD-10-CM | POA: Diagnosis not present

## 2023-01-08 DIAGNOSIS — E038 Other specified hypothyroidism: Secondary | ICD-10-CM | POA: Diagnosis not present

## 2023-01-10 DIAGNOSIS — E1122 Type 2 diabetes mellitus with diabetic chronic kidney disease: Secondary | ICD-10-CM | POA: Diagnosis not present

## 2023-01-10 DIAGNOSIS — I129 Hypertensive chronic kidney disease with stage 1 through stage 4 chronic kidney disease, or unspecified chronic kidney disease: Secondary | ICD-10-CM | POA: Diagnosis not present

## 2023-01-10 DIAGNOSIS — Z7982 Long term (current) use of aspirin: Secondary | ICD-10-CM | POA: Diagnosis not present

## 2023-01-10 DIAGNOSIS — R296 Repeated falls: Secondary | ICD-10-CM | POA: Diagnosis not present

## 2023-01-10 DIAGNOSIS — E1142 Type 2 diabetes mellitus with diabetic polyneuropathy: Secondary | ICD-10-CM | POA: Diagnosis not present

## 2023-01-10 DIAGNOSIS — E039 Hypothyroidism, unspecified: Secondary | ICD-10-CM | POA: Diagnosis not present

## 2023-01-10 DIAGNOSIS — E782 Mixed hyperlipidemia: Secondary | ICD-10-CM | POA: Diagnosis not present

## 2023-01-10 DIAGNOSIS — Z9181 History of falling: Secondary | ICD-10-CM | POA: Diagnosis not present

## 2023-01-10 DIAGNOSIS — N182 Chronic kidney disease, stage 2 (mild): Secondary | ICD-10-CM | POA: Diagnosis not present

## 2023-01-17 DIAGNOSIS — E039 Hypothyroidism, unspecified: Secondary | ICD-10-CM | POA: Diagnosis not present

## 2023-01-17 DIAGNOSIS — I129 Hypertensive chronic kidney disease with stage 1 through stage 4 chronic kidney disease, or unspecified chronic kidney disease: Secondary | ICD-10-CM | POA: Diagnosis not present

## 2023-01-17 DIAGNOSIS — E782 Mixed hyperlipidemia: Secondary | ICD-10-CM | POA: Diagnosis not present

## 2023-01-17 DIAGNOSIS — N182 Chronic kidney disease, stage 2 (mild): Secondary | ICD-10-CM | POA: Diagnosis not present

## 2023-01-17 DIAGNOSIS — E1122 Type 2 diabetes mellitus with diabetic chronic kidney disease: Secondary | ICD-10-CM | POA: Diagnosis not present

## 2023-01-17 DIAGNOSIS — E1142 Type 2 diabetes mellitus with diabetic polyneuropathy: Secondary | ICD-10-CM | POA: Diagnosis not present

## 2023-01-17 DIAGNOSIS — Z9181 History of falling: Secondary | ICD-10-CM | POA: Diagnosis not present

## 2023-01-17 DIAGNOSIS — R296 Repeated falls: Secondary | ICD-10-CM | POA: Diagnosis not present

## 2023-01-17 DIAGNOSIS — Z7982 Long term (current) use of aspirin: Secondary | ICD-10-CM | POA: Diagnosis not present

## 2023-01-19 ENCOUNTER — Ambulatory Visit: Payer: Medicare Other | Admitting: Obstetrics and Gynecology

## 2023-01-19 ENCOUNTER — Other Ambulatory Visit (HOSPITAL_COMMUNITY)
Admission: RE | Admit: 2023-01-19 | Discharge: 2023-01-19 | Disposition: A | Discharge status: Home / Self Care | Payer: Medicare Other | Source: Ambulatory Visit | Attending: Obstetrics and Gynecology | Admitting: Obstetrics and Gynecology

## 2023-01-19 ENCOUNTER — Encounter: Payer: Self-pay | Admitting: Obstetrics and Gynecology

## 2023-01-19 VITALS — BP 124/75 | HR 85

## 2023-01-19 DIAGNOSIS — L292 Pruritus vulvae: Secondary | ICD-10-CM

## 2023-01-19 HISTORY — PX: BIOPSY VULVA: PRO42

## 2023-01-19 MED ORDER — CLOBETASOL PROPIONATE 0.05 % EX OINT: TOPICAL_OINTMENT | CUTANEOUS | 2 refills | Status: DC

## 2023-01-19 NOTE — Progress Notes (Signed)
RGYN here for F/U on vaginal itching.  Pt notes improvement itching is not as severe anymore.  Reports had issues with pharmacy getting Rx and was not able to start cream would rather it be sent to local pharmacy [facility] in [place].

## 2023-01-19 NOTE — Progress Notes (Signed)
Obstetrics and Gynecology Visit Return Patient Evaluation  Appointment Date: 01/19/2023  Primary Care Provider: Smiley Francis  OBGYN Clinic: [facility] for [facility]  Chief Complaint: f/u vulvar/vaginal itching  History of Present Illness:  Margaret Francis is a 89 y.o. with above CC. Patient initially seen by me 3/28, and patient had UTI treated a few times in Dec/Jan and since then she has had vaginal itching refractory to her PCPs diflucan and monistat; pt lives at a nursing home and is here with her friend. She wears a depends at the nursing home and has OAB s/s. Last UTI check negative.   Erythema noted on exam, and Nuswab negative at that visit. Plan at that time was for clobetasol ointment and f/u in a few months  Interval History: Since that time, she states that unfortunately she has not been able to do the ointment due to pharmacy issues and she would like it sent to a new one. She states that it feels better in the GU area and has been very diligent about changing her undergarments and staying clean in that area.   She is again accompanied by her friend today.   Review of Systems:  as noted in the History of Present Illness.  Patient Active Problem List   Diagnosis Date Noted   Overweight (BMI 25.0-29.9) 01/04/2022   Hypertension    History of stroke    Thyroid disease    Intestinal infection, enteritis due to rotavirus    Transaminitis    Frequent falls    Peripheral neuropathy 11/29/2020   Cerebral ventriculomegaly 11/29/2020   Bilateral foot-drop 07/28/2020   Abnormality of gait 03/20/2013   Urinary frequency 04/19/2011   Medications:  Margaret Francis had no medications administered during this visit. Current Outpatient Medications  Medication Sig Dispense Refill   acetaminophen (TYLENOL) 325 MG tablet Take 650 mg by mouth every 6 (six) hours as needed.     aspirin 81 MG tablet Take 81 mg by mouth daily.     Calcium Carbonate Antacid 400 MG  CHEW Chew 400 mg by mouth as needed.     CHLOROTHIAZIDE PO Take 12.5 tablets by mouth daily.     Cholecalciferol (VITAMIN D3) 50 MCG (2000 UT) CAPS Take 2,000 Units by mouth daily.     levothyroxine (SYNTHROID, LEVOTHROID) 50 MCG tablet Take 50 mcg by mouth daily.     loperamide (IMODIUM) 2 MG capsule Take 1 capsule (2 mg total) by mouth as needed for diarrhea or loose stools. 30 capsule 0   losartan (COZAAR) 100 MG tablet Take 100 mg by mouth daily.     mirabegron ER (MYRBETRIQ) 50 MG TB24 tablet Take 1 tablet (50 mg total) by mouth daily. 90 tablet 3   mirtazapine (REMERON) 15 MG tablet Take 7.5 mg by mouth daily.     ondansetron (ZOFRAN) 4 MG tablet Take 4 mg by mouth every 8 (eight) hours as needed for nausea or vomiting.     pantoprazole (PROTONIX) 20 MG tablet Take 20 mg by mouth daily.     polyethylene glycol (MIRALAX / GLYCOLAX) 17 g packet Take 17 g by mouth daily.     sertraline (ZOLOFT) 50 MG tablet 1 tablet     clobetasol ointment (TEMOVATE) 0.05 % Apply to vulvar/vagina qhs x 1 month, then twice weekly until seen in the office. In July/august 30 g 2   No current facility-administered medications for this visit.    Allergies: is allergic to demerol and morphine and  codeine.  Physical Exam:  BP 124/75   Pulse 85  There is no height or weight on file to calculate BMI. General appearance: Well nourished, well developed female in no acute distress.  Neuro/Psych:  Normal mood and affect.    Pelvic exam:  Pelvic  exam performed in the presence of a chaperone EGBUS: with mild erythema and moderate erythema at the vaginal introitus. 4mm punch biopsy taken from right labia minora (see procedure note)   Assessment: pt stable  Plan:  1. Vulvar pruritus F/u pathology and clobetasol ointment sent to another pharmacy. I told her and her friend to make sure she doesn't just apply externally but particularly at the introitus.  - Surgical pathology( [place]/ [facility])   Return  in about 2 months (around 03/21/2023) for in person, with dr Margaret Francis.  Future Appointments  Date Time Provider Department Center  04/16/2023  2:00 PM Francis, Margaret Bayley, MD [facility] None    Margaret Copa MD Attending Center for [facility]

## 2023-01-19 NOTE — Procedures (Signed)
Vulvar Biopsy Procedure Note  Pre-operative Diagnosis: Chronic vulvar pruritis  Post-operative Diagnosis: same  Procedure Details  Pelvic exam performed in the presence of a chaperone The risks and benefits of the procedure were explained to the patient and Written informed consent was obtained.  The patient was placed in the dorsal lithotomy position. An area on the right labia minora at the junction of mild erythema to moderate erythema was chosen, at the introitus. Area cleaned with betadine and injected with 5mm of lidocaine with epi. 4mm punch biopsy done and sent to pathology; area hemostatic after silver nitrate. The sample was sent for pathologic examination.  Condition: Stable  Complications: None  Plan: Follow up final pathology  Cornelia Copa MD Attending Center for [facility])

## 2023-01-23 LAB — SURGICAL PATHOLOGY

## 2023-01-24 ENCOUNTER — Telehealth: Payer: Self-pay | Admitting: *Deleted

## 2023-01-24 NOTE — Telephone Encounter (Signed)
Left message making sure they saw results and message from Dr Vergie Living and if they have any questions to call back.

## 2023-01-24 NOTE — Telephone Encounter (Signed)
-----   Message from [place] Bing, MD sent at 01/23/2023 10:46 PM EDT ----- Can you let the patient and her friend know that the biopsy didn't show any cancer?  Also, please let them know that even though there was no cancer the ointment is definitely needed to prevent it from getting to the pre-cancerous stage?

## 2023-01-26 DIAGNOSIS — Z8673 Personal history of transient ischemic attack (TIA), and cerebral infarction without residual deficits: Secondary | ICD-10-CM | POA: Diagnosis not present

## 2023-01-31 DIAGNOSIS — E559 Vitamin D deficiency, unspecified: Secondary | ICD-10-CM | POA: Diagnosis not present

## 2023-01-31 DIAGNOSIS — N182 Chronic kidney disease, stage 2 (mild): Secondary | ICD-10-CM | POA: Diagnosis not present

## 2023-01-31 DIAGNOSIS — I679 Cerebrovascular disease, unspecified: Secondary | ICD-10-CM | POA: Diagnosis not present

## 2023-01-31 DIAGNOSIS — F03B0 Unspecified dementia, moderate, without behavioral disturbance, psychotic disturbance, mood disturbance, and anxiety: Secondary | ICD-10-CM | POA: Diagnosis not present

## 2023-01-31 DIAGNOSIS — I129 Hypertensive chronic kidney disease with stage 1 through stage 4 chronic kidney disease, or unspecified chronic kidney disease: Secondary | ICD-10-CM | POA: Diagnosis not present

## 2023-01-31 DIAGNOSIS — R269 Unspecified abnormalities of gait and mobility: Secondary | ICD-10-CM | POA: Diagnosis not present

## 2023-02-02 DIAGNOSIS — I69854 Hemiplegia and hemiparesis following other cerebrovascular disease affecting left non-dominant side: Secondary | ICD-10-CM | POA: Diagnosis not present

## 2023-02-02 DIAGNOSIS — E038 Other specified hypothyroidism: Secondary | ICD-10-CM | POA: Diagnosis not present

## 2023-02-15 DIAGNOSIS — I7091 Generalized atherosclerosis: Secondary | ICD-10-CM | POA: Diagnosis not present

## 2023-02-23 DIAGNOSIS — E039 Hypothyroidism, unspecified: Secondary | ICD-10-CM | POA: Diagnosis not present

## 2023-02-23 DIAGNOSIS — K5900 Constipation, unspecified: Secondary | ICD-10-CM | POA: Diagnosis not present

## 2023-02-23 DIAGNOSIS — M6281 Muscle weakness (generalized): Secondary | ICD-10-CM | POA: Diagnosis not present

## 2023-02-23 DIAGNOSIS — I10 Essential (primary) hypertension: Secondary | ICD-10-CM | POA: Diagnosis not present

## 2023-02-23 DIAGNOSIS — K219 Gastro-esophageal reflux disease without esophagitis: Secondary | ICD-10-CM | POA: Diagnosis not present

## 2023-02-26 DIAGNOSIS — Z8673 Personal history of transient ischemic attack (TIA), and cerebral infarction without residual deficits: Secondary | ICD-10-CM | POA: Diagnosis not present

## 2023-02-26 DIAGNOSIS — G3184 Mild cognitive impairment, so stated: Secondary | ICD-10-CM | POA: Diagnosis not present

## 2023-02-28 DIAGNOSIS — I129 Hypertensive chronic kidney disease with stage 1 through stage 4 chronic kidney disease, or unspecified chronic kidney disease: Secondary | ICD-10-CM | POA: Diagnosis not present

## 2023-02-28 DIAGNOSIS — E1122 Type 2 diabetes mellitus with diabetic chronic kidney disease: Secondary | ICD-10-CM | POA: Diagnosis not present

## 2023-02-28 DIAGNOSIS — I679 Cerebrovascular disease, unspecified: Secondary | ICD-10-CM | POA: Diagnosis not present

## 2023-02-28 DIAGNOSIS — Z66 Do not resuscitate: Secondary | ICD-10-CM | POA: Diagnosis not present

## 2023-02-28 DIAGNOSIS — F01A0 Vascular dementia, mild, without behavioral disturbance, psychotic disturbance, mood disturbance, and anxiety: Secondary | ICD-10-CM | POA: Diagnosis not present

## 2023-02-28 DIAGNOSIS — I69854 Hemiplegia and hemiparesis following other cerebrovascular disease affecting left non-dominant side: Secondary | ICD-10-CM | POA: Diagnosis not present

## 2023-02-28 DIAGNOSIS — R269 Unspecified abnormalities of gait and mobility: Secondary | ICD-10-CM | POA: Diagnosis not present

## 2023-02-28 DIAGNOSIS — Z79899 Other long term (current) drug therapy: Secondary | ICD-10-CM | POA: Diagnosis not present

## 2023-02-28 DIAGNOSIS — N182 Chronic kidney disease, stage 2 (mild): Secondary | ICD-10-CM | POA: Diagnosis not present

## 2023-03-22 ENCOUNTER — Ambulatory Visit: Payer: Medicare Other | Admitting: Obstetrics and Gynecology

## 2023-03-22 ENCOUNTER — Encounter: Payer: Self-pay | Admitting: Obstetrics and Gynecology

## 2023-03-22 VITALS — BP 101/68 | HR 81

## 2023-03-22 DIAGNOSIS — L292 Pruritus vulvae: Secondary | ICD-10-CM

## 2023-03-22 DIAGNOSIS — B3731 Acute candidiasis of vulva and vagina: Secondary | ICD-10-CM

## 2023-03-22 MED ORDER — CLOBETASOL PROPIONATE 0.05 % EX OINT: TOPICAL_OINTMENT | CUTANEOUS | 2 refills | Status: DC

## 2023-03-22 MED ORDER — FLUCONAZOLE 150 MG PO TABS: 150.0000 mg | ORAL_TABLET | ORAL | 0 refills | Status: AC

## 2023-03-22 NOTE — Progress Notes (Signed)
Obstetrics and Gynecology Visit Return Patient Evaluation  Appointment Date: 03/22/2023  Primary Care Provider: Smiley Houseman  OBGYN Clinic: [facility]  Chief Complaint: follow up vulvar-vaginal itching.   History of Present Illness:  Margaret Francis is a 89 y.o. here for follow up.   Patient initially seen by me 11/16/2022, and patient had UTI treated a few times in Dec/Jan and since then she has had vaginal itching refractory to her PCPs diflucan and monistat; pt lives at a nursing home and is here with her friend. She wears a depends at the nursing home and has OAB s/s. Last UTI check negative.   Erythema noted on exam, and Nuswab negative at that visit. Plan at that time was for clobetasol ointment and f/u in a few months  Patient seen for follow up on 5/31 and right vulvar biopsy done which showed no malignancy with, "Squamous mucosa with marked acute and chronic inflammation and reactive atypia  Negative for dysplasia and diagnostic viral change  Fungal stain negative (PASF, adequate control)"  She lives in an assisted living facility so difficulty with proper placement of the ointment and had some difficulty getting it from the pharmacy. Plan to continue treatment and RTC in 3 months   Interval History: Friend here with her believes s/s are better because pt not irritated with sitting down, but patient still notes itching. She states it is being applied nightly and they were able to pick it up a few days after her last visti on 5/31. .   Review of Systems: as noted in the History of Present Illness.  Patient Active Problem List   Diagnosis Date Noted   Vulvar pruritus 01/19/2023   Overweight (BMI 25.0-29.9) 01/04/2022   Hypertension    History of stroke    Thyroid disease    Intestinal infection, enteritis due to rotavirus    Transaminitis    Frequent falls    Peripheral neuropathy 11/29/2020   Cerebral ventriculomegaly 11/29/2020    Bilateral foot-drop 07/28/2020   Abnormality of gait 03/20/2013   Urinary frequency 04/19/2011   Medications:  Solange C. Kantor had no medications administered during this visit. Current Outpatient Medications  Medication Sig Dispense Refill   acetaminophen (TYLENOL) 325 MG tablet Take 650 mg by mouth every 6 (six) hours as needed.     aspirin 81 MG tablet Take 81 mg by mouth daily.     Calcium Carbonate Antacid 400 MG CHEW Chew 400 mg by mouth as needed.     CHLOROTHIAZIDE PO Take 12.5 tablets by mouth daily.     Cholecalciferol (VITAMIN D3) 50 MCG (2000 UT) CAPS Take 2,000 Units by mouth daily.     fluconazole (DIFLUCAN) 150 MG tablet Take 1 tablet (150 mg total) by mouth every 3 (three) days for 2 doses. Can take additional dose three days later if symptoms persist 2 tablet 0   levothyroxine (SYNTHROID, LEVOTHROID) 50 MCG tablet Take 50 mcg by mouth daily.     loperamide (IMODIUM) 2 MG capsule Take 1 capsule (2 mg total) by mouth as needed for diarrhea or loose stools. 30 capsule 0   losartan (COZAAR) 100 MG tablet Take 100 mg by mouth daily.     mirabegron ER (MYRBETRIQ) 50 MG TB24 tablet Take 1 tablet (50 mg total) by mouth daily. 90 tablet 3   mirtazapine (REMERON) 15 MG tablet Take 7.5 mg by mouth daily.     ondansetron (ZOFRAN) 4 MG tablet Take 4 mg by mouth  every 8 (eight) hours as needed for nausea or vomiting.     pantoprazole (PROTONIX) 20 MG tablet Take 20 mg by mouth daily.     polyethylene glycol (MIRALAX / GLYCOLAX) 17 g packet Take 17 g by mouth daily.     sertraline (ZOLOFT) 50 MG tablet 1 tablet     clobetasol ointment (TEMOVATE) 0.05 % Spread the labia/lips as wide as possible and apply half applicator full tube of medicine to the opening of the vagina qhs x 2 month, then three times  weekly until seen in the office. 30 g 2   No current facility-administered medications for this visit.    Allergies: is allergic to demerol and morphine and codeine.  Physical Exam:  BP  101/68   Pulse 81  There is no height or weight on file to calculate BMI. General appearance: Well nourished, well developed female in no acute distress.  Neuro/Psych:  Normal mood and affect.    Pelvic exam:  Labia majora wnl Labia minora stable with erythema at the introitus, particularly near the urethra. White cottage cheese d/c at the introitus   Assessment: pt stable  Plan:  1. Vulvovaginal candidiasis Diflucan x 2 q72h apart  2. Vulvar itching It's difficult b/c of where she lives but the ointment really needs to get the introitus and not just the labia majora. The key is to spread the labia majora as wide as possible before application. Continue with at bedtime application x25m and then 3x/week until I see her back. If not significant improved, consider repeat biopsy.   Return in about 3 months (around 06/22/2023) for in person, with dr Vergie Living.  Future Appointments  Date Time Provider Department Center  04/16/2023  2:00 PM Penumalli, Glenford Bayley, MD [facility] None    Cornelia Copa MD Attending [facility])

## 2023-03-22 NOTE — Progress Notes (Signed)
Follow up :  Still itching, medication from [place] helping some

## 2023-03-26 DIAGNOSIS — Z8673 Personal history of transient ischemic attack (TIA), and cerebral infarction without residual deficits: Secondary | ICD-10-CM | POA: Diagnosis not present

## 2023-03-26 DIAGNOSIS — G3184 Mild cognitive impairment, so stated: Secondary | ICD-10-CM | POA: Diagnosis not present

## 2023-04-04 DIAGNOSIS — R2681 Unsteadiness on feet: Secondary | ICD-10-CM | POA: Diagnosis not present

## 2023-04-04 DIAGNOSIS — I679 Cerebrovascular disease, unspecified: Secondary | ICD-10-CM | POA: Diagnosis not present

## 2023-04-04 DIAGNOSIS — I129 Hypertensive chronic kidney disease with stage 1 through stage 4 chronic kidney disease, or unspecified chronic kidney disease: Secondary | ICD-10-CM | POA: Diagnosis not present

## 2023-04-04 DIAGNOSIS — F01A0 Vascular dementia, mild, without behavioral disturbance, psychotic disturbance, mood disturbance, and anxiety: Secondary | ICD-10-CM | POA: Diagnosis not present

## 2023-04-04 DIAGNOSIS — W19XXXA Unspecified fall, initial encounter: Secondary | ICD-10-CM | POA: Diagnosis not present

## 2023-04-04 DIAGNOSIS — N182 Chronic kidney disease, stage 2 (mild): Secondary | ICD-10-CM | POA: Diagnosis not present

## 2023-04-04 DIAGNOSIS — I69354 Hemiplegia and hemiparesis following cerebral infarction affecting left non-dominant side: Secondary | ICD-10-CM | POA: Diagnosis not present

## 2023-04-09 DIAGNOSIS — G3184 Mild cognitive impairment, so stated: Secondary | ICD-10-CM | POA: Diagnosis not present

## 2023-04-09 DIAGNOSIS — Z8673 Personal history of transient ischemic attack (TIA), and cerebral infarction without residual deficits: Secondary | ICD-10-CM | POA: Diagnosis not present

## 2023-04-12 ENCOUNTER — Ambulatory Visit: Payer: Medicare Other | Admitting: Diagnostic Neuroimaging

## 2023-04-16 ENCOUNTER — Encounter: Payer: Self-pay | Admitting: Diagnostic Neuroimaging

## 2023-04-16 ENCOUNTER — Ambulatory Visit: Payer: Medicare Other | Admitting: Diagnostic Neuroimaging

## 2023-04-16 VITALS — BP 104/67 | HR 80 | Ht 67.0 in | Wt 180.0 lb

## 2023-04-16 DIAGNOSIS — R269 Unspecified abnormalities of gait and mobility: Secondary | ICD-10-CM | POA: Diagnosis not present

## 2023-04-16 DIAGNOSIS — G609 Hereditary and idiopathic neuropathy, unspecified: Secondary | ICD-10-CM

## 2023-04-16 DIAGNOSIS — F03A0 Unspecified dementia, mild, without behavioral disturbance, psychotic disturbance, mood disturbance, and anxiety: Secondary | ICD-10-CM

## 2023-04-16 DIAGNOSIS — I69354 Hemiplegia and hemiparesis following cerebral infarction affecting left non-dominant side: Secondary | ICD-10-CM | POA: Diagnosis not present

## 2023-04-16 DIAGNOSIS — E039 Hypothyroidism, unspecified: Secondary | ICD-10-CM | POA: Diagnosis not present

## 2023-04-16 DIAGNOSIS — I129 Hypertensive chronic kidney disease with stage 1 through stage 4 chronic kidney disease, or unspecified chronic kidney disease: Secondary | ICD-10-CM | POA: Diagnosis not present

## 2023-04-16 DIAGNOSIS — N182 Chronic kidney disease, stage 2 (mild): Secondary | ICD-10-CM | POA: Diagnosis not present

## 2023-04-16 DIAGNOSIS — G629 Polyneuropathy, unspecified: Secondary | ICD-10-CM | POA: Diagnosis not present

## 2023-04-16 NOTE — Progress Notes (Signed)
[facility]  PATIENT: Margaret Francis DOB: March 19, 1930  REFERRING CLINICIAN: Smiley Houseman, NP HISTORY FROM: patient  REASON FOR VISIT: new consult   HISTORICAL  CHIEF COMPLAINT:  Chief Complaint  Patient presents with   Follow-up    Rm 6, here  Pt is following up on gait instability.     HISTORY OF PRESENT ILLNESS:   UPDATE (04/16/23, VRP): 89 year old female here for evaluation of dementia.  Patient having increasing fatigue and gait difficulty.  She lives at [facility] assisted living.  She needs 1-2 person assistance for mobility.  Caregiver notes that patient sometimes sleeps all day.  She needs help and assistance with motivation to get up and out of bed.  PRIOR HPI (10/10/22, SS): "Here today with friend, heather. [place]oved to [place] in June 2023 after Norovirus. Living at [facility]. Continues with PT. Has had 11 falls from [place]. Facility restricts her from independent mobility. Has moved to a bigger room recently, that is more handicap accessible. May take a nap, wake up disoriented, moving from home of 64 years, few different rooms at facility. Takes a nap in the afternoon, can be confused after, thinks she slept all night in the chair. Had fallen went to the ER November 2023, CT showed bilateral nasal bone fractures. Has urinary incontinence. Mentions memory issues. In the past has not wanted to consider a shunt. Also has peripheral neuropathy that contributes. MMSE 16/30. I reviewed CT head in November with Dr. Marjory Lies, no significant change in 10 years in regards to NPH, there is more atrophy. Seems her gait is less likely completely explained by NPH".   REVIEW OF SYSTEMS: Full 14 system review of systems performed and negative with exception of: as per HPI.  ALLERGIES: Allergies  Allergen Reactions   Demerol Nausea And Vomiting   Morphine And Codeine Nausea And Vomiting    HOME MEDICATIONS: Outpatient Medications Prior to Visit  Medication  Sig Dispense Refill   acetaminophen (TYLENOL) 325 MG tablet Take 650 mg by mouth every 6 (six) hours as needed.     aspirin 81 MG tablet Take 81 mg by mouth daily.     Calcium Carbonate Antacid 400 MG CHEW Chew 400 mg by mouth as needed.     CHLOROTHIAZIDE PO Take 12.5 tablets by mouth daily.     Cholecalciferol (VITAMIN D3) 50 MCG (2000 UT) CAPS Take 2,000 Units by mouth daily.     clobetasol ointment (TEMOVATE) 0.05 % Spread the labia/lips as wide as possible and apply half applicator full tube of medicine to the opening of the vagina qhs x 2 month, then three times  weekly until seen in the office. 30 g 2   hydrochlorothiazide (HYDRODIURIL) 12.5 MG tablet Take 12.5 mg by mouth daily.     levothyroxine (SYNTHROID, LEVOTHROID) 50 MCG tablet Take 50 mcg by mouth daily.     loperamide (IMODIUM) 2 MG capsule Take 1 capsule (2 mg total) by mouth as needed for diarrhea or loose stools. 30 capsule 0   losartan (COZAAR) 100 MG tablet Take 100 mg by mouth daily.     mirabegron ER (MYRBETRIQ) 50 MG TB24 tablet Take 1 tablet (50 mg total) by mouth daily. 90 tablet 3   mirtazapine (REMERON) 15 MG tablet Take 7.5 mg by mouth daily.     ondansetron (ZOFRAN) 4 MG tablet Take 4 mg by mouth every 8 (eight) hours as needed for nausea or vomiting.     pantoprazole (PROTONIX) 20 MG tablet Take 20  mg by mouth daily.     polyethylene glycol (MIRALAX / GLYCOLAX) 17 g packet Take 17 g by mouth daily.     sertraline (ZOLOFT) 50 MG tablet 1 tablet     No facility-administered medications prior to visit.      PHYSICAL EXAM  GENERAL EXAM/CONSTITUTIONAL: Vitals:  Vitals:   04/16/23 1453  BP: 104/67  Pulse: 80  Weight: 180 lb (81.6 kg)  Height: 5\' 7"  (1.702 m)   Body mass index is 28.19 kg/m. Wt Readings from Last 3 Encounters:  04/16/23 180 lb (81.6 kg)  06/29/22 190 lb (86.2 kg)  01/02/22 190 lb (86.2 kg)   Patient is in no distress; well developed, nourished and groomed; neck is  supple  CARDIOVASCULAR: Examination of carotid arteries is normal; no carotid bruits Regular rate and rhythm, no murmurs Examination of peripheral vascular system by observation and palpation is normal  EYES: Ophthalmoscopic exam of optic discs and posterior segments is normal; no papilledema or hemorrhages No results found.  MUSCULOSKELETAL: Gait, strength, tone, movements noted in Neurologic exam below  NEUROLOGIC: MENTAL STATUS:     10/10/2022   11:00 AM 11/29/2020   10:40 AM  MMSE - Mini Mental State Exam  Orientation to time 0 3  Orientation to Place 5 5  Registration 3 3  Attention/ Calculation 1 2  Recall 0 2  Language- name 2 objects 2 2  Language- repeat 1 1  Language- follow 3 step command 3 3  Language- read & follow direction 1 1  Write a sentence 0 1  Copy design 0 0  Total score 16 23   awake, alert, oriented to person, place and time recent and remote memory intact normal attention and concentration language fluent, comprehension intact, naming intact fund of knowledge appropriate  CRANIAL NERVE:  2nd - no papilledema on fundoscopic exam 2nd, 3rd, 4th, 6th - pupils equal and reactive to light, visual fields full to confrontation, extraocular muscles intact, no nystagmus 5th - facial sensation symmetric 7th - facial strength symmetric 8th - hearing intact 9th - palate elevates symmetrically, uvula midline 11th - shoulder shrug symmetric 12th - tongue protrusion midline  MOTOR:  normal bulk and tone, DIFFUSE BUE 4, BLE 3  SENSORY:  normal and symmetric to light touch  COORDINATION:  finger-nose-finger, fine finger movements normal  REFLEXES:  deep tendon reflexes TRACE and symmetric  GAIT/STATION:  IN TRANSPORT CHAIR     DIAGNOSTIC DATA (LABS, IMAGING, TESTING) - I reviewed patient records, labs, notes, testing and imaging myself where available.  Lab Results  Component Value Date   WBC 8.2 06/29/2022   HGB 13.6 06/29/2022   HCT  41.0 06/29/2022   MCV 90.3 06/29/2022   PLT 260 06/29/2022      Component Value Date/Time   NA 136 06/29/2022 0036   K 4.0 06/29/2022 0036   CL 103 06/29/2022 0036   CO2 24 06/29/2022 0036   GLUCOSE 162 (H) 06/29/2022 0036   BUN 16 06/29/2022 0036   CREATININE 0.85 06/29/2022 0036   CALCIUM 11.1 (H) 06/29/2022 0036   PROT 5.9 (L) 01/04/2022 0429   PROT 6.8 07/28/2020 1002   ALBUMIN 3.0 (L) 01/04/2022 0429   AST 40 01/04/2022 0429   ALT 51 (H) 01/04/2022 0429   ALKPHOS 115 01/04/2022 0429   BILITOT 0.2 (L) 01/04/2022 0429   GFRNONAA >60 06/29/2022 0036   GFRAA >60 02/28/2019 1151   No results found for: "CHOL", "HDL", "LDLCALC", "LDLDIRECT", "TRIG", "CHOLHDL" No results found  for: "HGBA1C" Lab Results  Component Value Date   VITAMINB12 437 07/28/2020   No results found for: "TSH"   06/29/22 CT head / maxillofacial / cervical spine: 1. No acute intracranial abnormality or calvarial fracture. 2. Bilateral nasal bone fractures. 3. Nasal septal fracture. No definite nasal septal hematoma though clinical correlation is recommended. 4. No acute cervical spine fracture.  Multilevel spondylosis.    ASSESSMENT AND PLAN  89 y.o. year old female here with:  Dx:  1. Abnormality of gait   2. Idiopathic peripheral neuropathy   3. Mild dementia without behavioral disturbance, psychotic disturbance, mood disturbance, or anxiety, unspecified dementia type (HCC)     PLAN:  GAIT DIFFICULTY (due to neuropathy, age, deconditioning, dementia) - continue PT exercises and supportive care  FATIGUE (due to age, dementia, schedule) - continue supportive care  Return for return to PCP, pending if symptoms worsen or fail to improve.    Suanne Marker, MD 04/16/2023, 5:41 PM Certified in Neurology, Neurophysiology and Neuroimaging  [facility] [address] [place], [place] [address] [phone]

## 2023-04-19 DIAGNOSIS — G629 Polyneuropathy, unspecified: Secondary | ICD-10-CM | POA: Diagnosis not present

## 2023-04-19 DIAGNOSIS — N182 Chronic kidney disease, stage 2 (mild): Secondary | ICD-10-CM | POA: Diagnosis not present

## 2023-04-19 DIAGNOSIS — I69354 Hemiplegia and hemiparesis following cerebral infarction affecting left non-dominant side: Secondary | ICD-10-CM | POA: Diagnosis not present

## 2023-04-19 DIAGNOSIS — I7091 Generalized atherosclerosis: Secondary | ICD-10-CM | POA: Diagnosis not present

## 2023-04-19 DIAGNOSIS — I129 Hypertensive chronic kidney disease with stage 1 through stage 4 chronic kidney disease, or unspecified chronic kidney disease: Secondary | ICD-10-CM | POA: Diagnosis not present

## 2023-04-19 DIAGNOSIS — E039 Hypothyroidism, unspecified: Secondary | ICD-10-CM | POA: Diagnosis not present

## 2023-04-19 DIAGNOSIS — E1122 Type 2 diabetes mellitus with diabetic chronic kidney disease: Secondary | ICD-10-CM | POA: Diagnosis not present

## 2023-04-24 DIAGNOSIS — E039 Hypothyroidism, unspecified: Secondary | ICD-10-CM | POA: Diagnosis not present

## 2023-04-24 DIAGNOSIS — I69354 Hemiplegia and hemiparesis following cerebral infarction affecting left non-dominant side: Secondary | ICD-10-CM | POA: Diagnosis not present

## 2023-04-24 DIAGNOSIS — I129 Hypertensive chronic kidney disease with stage 1 through stage 4 chronic kidney disease, or unspecified chronic kidney disease: Secondary | ICD-10-CM | POA: Diagnosis not present

## 2023-04-24 DIAGNOSIS — N182 Chronic kidney disease, stage 2 (mild): Secondary | ICD-10-CM | POA: Diagnosis not present

## 2023-04-24 DIAGNOSIS — G629 Polyneuropathy, unspecified: Secondary | ICD-10-CM | POA: Diagnosis not present

## 2023-04-25 DIAGNOSIS — R2681 Unsteadiness on feet: Secondary | ICD-10-CM | POA: Diagnosis not present

## 2023-04-25 DIAGNOSIS — E1122 Type 2 diabetes mellitus with diabetic chronic kidney disease: Secondary | ICD-10-CM | POA: Diagnosis not present

## 2023-04-25 DIAGNOSIS — I739 Peripheral vascular disease, unspecified: Secondary | ICD-10-CM | POA: Diagnosis not present

## 2023-04-25 DIAGNOSIS — G629 Polyneuropathy, unspecified: Secondary | ICD-10-CM | POA: Diagnosis not present

## 2023-04-25 DIAGNOSIS — N182 Chronic kidney disease, stage 2 (mild): Secondary | ICD-10-CM | POA: Diagnosis not present

## 2023-04-25 DIAGNOSIS — I69354 Hemiplegia and hemiparesis following cerebral infarction affecting left non-dominant side: Secondary | ICD-10-CM | POA: Diagnosis not present

## 2023-04-25 DIAGNOSIS — E039 Hypothyroidism, unspecified: Secondary | ICD-10-CM | POA: Diagnosis not present

## 2023-04-25 DIAGNOSIS — I129 Hypertensive chronic kidney disease with stage 1 through stage 4 chronic kidney disease, or unspecified chronic kidney disease: Secondary | ICD-10-CM | POA: Diagnosis not present

## 2023-04-27 DIAGNOSIS — R300 Dysuria: Secondary | ICD-10-CM | POA: Diagnosis not present

## 2023-04-30 DIAGNOSIS — E559 Vitamin D deficiency, unspecified: Secondary | ICD-10-CM | POA: Diagnosis not present

## 2023-04-30 DIAGNOSIS — I69354 Hemiplegia and hemiparesis following cerebral infarction affecting left non-dominant side: Secondary | ICD-10-CM | POA: Diagnosis not present

## 2023-04-30 DIAGNOSIS — N182 Chronic kidney disease, stage 2 (mild): Secondary | ICD-10-CM | POA: Diagnosis not present

## 2023-04-30 DIAGNOSIS — I129 Hypertensive chronic kidney disease with stage 1 through stage 4 chronic kidney disease, or unspecified chronic kidney disease: Secondary | ICD-10-CM | POA: Diagnosis not present

## 2023-04-30 DIAGNOSIS — Z79899 Other long term (current) drug therapy: Secondary | ICD-10-CM | POA: Diagnosis not present

## 2023-04-30 DIAGNOSIS — E039 Hypothyroidism, unspecified: Secondary | ICD-10-CM | POA: Diagnosis not present

## 2023-04-30 DIAGNOSIS — G629 Polyneuropathy, unspecified: Secondary | ICD-10-CM | POA: Diagnosis not present

## 2023-05-01 DIAGNOSIS — N182 Chronic kidney disease, stage 2 (mild): Secondary | ICD-10-CM | POA: Diagnosis not present

## 2023-05-01 DIAGNOSIS — I129 Hypertensive chronic kidney disease with stage 1 through stage 4 chronic kidney disease, or unspecified chronic kidney disease: Secondary | ICD-10-CM | POA: Diagnosis not present

## 2023-05-01 DIAGNOSIS — I69354 Hemiplegia and hemiparesis following cerebral infarction affecting left non-dominant side: Secondary | ICD-10-CM | POA: Diagnosis not present

## 2023-05-02 DIAGNOSIS — I679 Cerebrovascular disease, unspecified: Secondary | ICD-10-CM | POA: Diagnosis not present

## 2023-05-02 DIAGNOSIS — N390 Urinary tract infection, site not specified: Secondary | ICD-10-CM | POA: Diagnosis not present

## 2023-05-03 DIAGNOSIS — I129 Hypertensive chronic kidney disease with stage 1 through stage 4 chronic kidney disease, or unspecified chronic kidney disease: Secondary | ICD-10-CM | POA: Diagnosis not present

## 2023-05-03 DIAGNOSIS — G629 Polyneuropathy, unspecified: Secondary | ICD-10-CM | POA: Diagnosis not present

## 2023-05-03 DIAGNOSIS — I69354 Hemiplegia and hemiparesis following cerebral infarction affecting left non-dominant side: Secondary | ICD-10-CM | POA: Diagnosis not present

## 2023-05-03 DIAGNOSIS — E039 Hypothyroidism, unspecified: Secondary | ICD-10-CM | POA: Diagnosis not present

## 2023-05-03 DIAGNOSIS — N182 Chronic kidney disease, stage 2 (mild): Secondary | ICD-10-CM | POA: Diagnosis not present

## 2023-05-07 DIAGNOSIS — N182 Chronic kidney disease, stage 2 (mild): Secondary | ICD-10-CM | POA: Diagnosis not present

## 2023-05-07 DIAGNOSIS — E039 Hypothyroidism, unspecified: Secondary | ICD-10-CM | POA: Diagnosis not present

## 2023-05-07 DIAGNOSIS — I69354 Hemiplegia and hemiparesis following cerebral infarction affecting left non-dominant side: Secondary | ICD-10-CM | POA: Diagnosis not present

## 2023-05-07 DIAGNOSIS — I129 Hypertensive chronic kidney disease with stage 1 through stage 4 chronic kidney disease, or unspecified chronic kidney disease: Secondary | ICD-10-CM | POA: Diagnosis not present

## 2023-05-07 DIAGNOSIS — G629 Polyneuropathy, unspecified: Secondary | ICD-10-CM | POA: Diagnosis not present

## 2023-05-08 ENCOUNTER — Emergency Department: Payer: Medicare Other

## 2023-05-08 ENCOUNTER — Other Ambulatory Visit: Payer: Self-pay

## 2023-05-08 ENCOUNTER — Inpatient Hospital Stay
Admission: EM | Admit: 2023-05-08 | Discharge: 2023-05-15 | DRG: 872 | Disposition: A | Discharge status: Skilled Nursing Facility | Payer: Medicare Other | Source: Skilled Nursing Facility | Attending: Internal Medicine | Admitting: Internal Medicine

## 2023-05-08 ENCOUNTER — Inpatient Hospital Stay: Payer: Medicare Other

## 2023-05-08 DIAGNOSIS — N390 Urinary tract infection, site not specified: Secondary | ICD-10-CM | POA: Diagnosis present

## 2023-05-08 DIAGNOSIS — R0689 Other abnormalities of breathing: Secondary | ICD-10-CM | POA: Diagnosis not present

## 2023-05-08 DIAGNOSIS — D696 Thrombocytopenia, unspecified: Secondary | ICD-10-CM | POA: Diagnosis present

## 2023-05-08 DIAGNOSIS — R6520 Severe sepsis without septic shock: Secondary | ICD-10-CM | POA: Diagnosis present

## 2023-05-08 DIAGNOSIS — R269 Unspecified abnormalities of gait and mobility: Secondary | ICD-10-CM

## 2023-05-08 DIAGNOSIS — Z6832 Body mass index (BMI) 32.0-32.9, adult: Secondary | ICD-10-CM

## 2023-05-08 DIAGNOSIS — B3731 Acute candidiasis of vulva and vagina: Secondary | ICD-10-CM | POA: Diagnosis present

## 2023-05-08 DIAGNOSIS — F32A Depression, unspecified: Secondary | ICD-10-CM | POA: Diagnosis present

## 2023-05-08 DIAGNOSIS — Z743 Need for continuous supervision: Secondary | ICD-10-CM | POA: Diagnosis not present

## 2023-05-08 DIAGNOSIS — Z803 Family history of malignant neoplasm of breast: Secondary | ICD-10-CM

## 2023-05-08 DIAGNOSIS — Z8249 Family history of ischemic heart disease and other diseases of the circulatory system: Secondary | ICD-10-CM

## 2023-05-08 DIAGNOSIS — L292 Pruritus vulvae: Secondary | ICD-10-CM | POA: Diagnosis present

## 2023-05-08 DIAGNOSIS — E039 Hypothyroidism, unspecified: Secondary | ICD-10-CM | POA: Insufficient documentation

## 2023-05-08 DIAGNOSIS — E669 Obesity, unspecified: Secondary | ICD-10-CM | POA: Diagnosis present

## 2023-05-08 DIAGNOSIS — M21372 Foot drop, left foot: Secondary | ICD-10-CM | POA: Diagnosis present

## 2023-05-08 DIAGNOSIS — I672 Cerebral atherosclerosis: Secondary | ICD-10-CM | POA: Diagnosis not present

## 2023-05-08 DIAGNOSIS — Z79890 Hormone replacement therapy (postmenopausal): Secondary | ICD-10-CM | POA: Diagnosis not present

## 2023-05-08 DIAGNOSIS — Z66 Do not resuscitate: Secondary | ICD-10-CM | POA: Diagnosis not present

## 2023-05-08 DIAGNOSIS — Z1152 Encounter for screening for COVID-19: Secondary | ICD-10-CM

## 2023-05-08 DIAGNOSIS — R6889 Other general symptoms and signs: Secondary | ICD-10-CM | POA: Diagnosis not present

## 2023-05-08 DIAGNOSIS — K5900 Constipation, unspecified: Secondary | ICD-10-CM | POA: Diagnosis not present

## 2023-05-08 DIAGNOSIS — M21371 Foot drop, right foot: Secondary | ICD-10-CM | POA: Diagnosis present

## 2023-05-08 DIAGNOSIS — E559 Vitamin D deficiency, unspecified: Secondary | ICD-10-CM | POA: Diagnosis not present

## 2023-05-08 DIAGNOSIS — M19012 Primary osteoarthritis, left shoulder: Secondary | ICD-10-CM | POA: Diagnosis not present

## 2023-05-08 DIAGNOSIS — R54 Age-related physical debility: Secondary | ICD-10-CM | POA: Diagnosis not present

## 2023-05-08 DIAGNOSIS — A419 Sepsis, unspecified organism: Secondary | ICD-10-CM | POA: Diagnosis not present

## 2023-05-08 DIAGNOSIS — Z79899 Other long term (current) drug therapy: Secondary | ICD-10-CM

## 2023-05-08 DIAGNOSIS — K219 Gastro-esophageal reflux disease without esophagitis: Secondary | ICD-10-CM | POA: Diagnosis not present

## 2023-05-08 DIAGNOSIS — Z7982 Long term (current) use of aspirin: Secondary | ICD-10-CM | POA: Diagnosis not present

## 2023-05-08 DIAGNOSIS — R531 Weakness: Secondary | ICD-10-CM | POA: Diagnosis not present

## 2023-05-08 DIAGNOSIS — R8281 Pyuria: Secondary | ICD-10-CM | POA: Insufficient documentation

## 2023-05-08 DIAGNOSIS — E876 Hypokalemia: Secondary | ICD-10-CM | POA: Diagnosis present

## 2023-05-08 DIAGNOSIS — Z8261 Family history of arthritis: Secondary | ICD-10-CM

## 2023-05-08 DIAGNOSIS — G629 Polyneuropathy, unspecified: Secondary | ICD-10-CM

## 2023-05-08 DIAGNOSIS — Z8673 Personal history of transient ischemic attack (TIA), and cerebral infarction without residual deficits: Secondary | ICD-10-CM | POA: Diagnosis not present

## 2023-05-08 DIAGNOSIS — E871 Hypo-osmolality and hyponatremia: Secondary | ICD-10-CM | POA: Diagnosis present

## 2023-05-08 DIAGNOSIS — S022XXA Fracture of nasal bones, initial encounter for closed fracture: Secondary | ICD-10-CM | POA: Diagnosis not present

## 2023-05-08 DIAGNOSIS — Z90710 Acquired absence of both cervix and uterus: Secondary | ICD-10-CM

## 2023-05-08 DIAGNOSIS — I10 Essential (primary) hypertension: Secondary | ICD-10-CM | POA: Diagnosis not present

## 2023-05-08 DIAGNOSIS — R0683 Snoring: Secondary | ICD-10-CM | POA: Diagnosis present

## 2023-05-08 DIAGNOSIS — M79622 Pain in left upper arm: Secondary | ICD-10-CM | POA: Diagnosis not present

## 2023-05-08 DIAGNOSIS — Z885 Allergy status to narcotic agent status: Secondary | ICD-10-CM | POA: Diagnosis not present

## 2023-05-08 DIAGNOSIS — Z833 Family history of diabetes mellitus: Secondary | ICD-10-CM

## 2023-05-08 DIAGNOSIS — N3281 Overactive bladder: Secondary | ICD-10-CM | POA: Diagnosis not present

## 2023-05-08 DIAGNOSIS — R509 Fever, unspecified: Secondary | ICD-10-CM | POA: Diagnosis not present

## 2023-05-08 DIAGNOSIS — Z9049 Acquired absence of other specified parts of digestive tract: Secondary | ICD-10-CM

## 2023-05-08 LAB — LACTIC ACID, PLASMA
Lactic Acid, Venous: 1.8 mmol/L (ref 0.5–1.9)
Lactic Acid, Venous: 1.8 mmol/L (ref 0.5–1.9)

## 2023-05-08 LAB — RESP PANEL BY RT-PCR (RSV, FLU A&B, COVID)  RVPGX2
Influenza A by PCR: NEGATIVE
Influenza B by PCR: NEGATIVE
Resp Syncytial Virus by PCR: NEGATIVE
SARS Coronavirus 2 by RT PCR: NEGATIVE

## 2023-05-08 LAB — URINALYSIS, ROUTINE W REFLEX MICROSCOPIC
Bacteria, UA: NONE SEEN
Bilirubin Urine: NEGATIVE
Glucose, UA: NEGATIVE mg/dL
Hgb urine dipstick: NEGATIVE
Ketones, ur: NEGATIVE mg/dL
Nitrite: NEGATIVE
Protein, ur: NEGATIVE mg/dL
Specific Gravity, Urine: 1.021 (ref 1.005–1.030)
pH: 7 (ref 5.0–8.0)

## 2023-05-08 LAB — CBC
HCT: 44.4 % (ref 36.0–46.0)
Hemoglobin: 14.6 g/dL (ref 12.0–15.0)
MCH: 30.4 pg (ref 26.0–34.0)
MCHC: 32.9 g/dL (ref 30.0–36.0)
MCV: 92.3 fL (ref 80.0–100.0)
Platelets: 202 10*3/uL (ref 150–400)
RBC: 4.81 MIL/uL (ref 3.87–5.11)
RDW: 13.1 % (ref 11.5–15.5)
WBC: 12.4 10*3/uL — ABNORMAL HIGH (ref 4.0–10.5)
nRBC: 0 % (ref 0.0–0.2)

## 2023-05-08 LAB — BASIC METABOLIC PANEL
Anion gap: 11 (ref 5–15)
BUN: 16 mg/dL (ref 8–23)
CO2: 24 mmol/L (ref 22–32)
Calcium: 10.9 mg/dL — ABNORMAL HIGH (ref 8.9–10.3)
Chloride: 97 mmol/L — ABNORMAL LOW (ref 98–111)
Creatinine, Ser: 0.82 mg/dL (ref 0.44–1.00)
GFR, Estimated: >60 mL/min (ref >60)
Glucose, Bld: 176 mg/dL — ABNORMAL HIGH (ref 70–99)
Potassium: 4.2 mmol/L (ref 3.5–5.1)
Sodium: 132 mmol/L — ABNORMAL LOW (ref 135–145)

## 2023-05-08 LAB — HEPATIC FUNCTION PANEL
ALT: 31 U/L (ref 0–44)
AST: 29 U/L (ref 15–41)
Albumin: 3.8 g/dL (ref 3.5–5.0)
Alkaline Phosphatase: 138 U/L — ABNORMAL HIGH (ref 38–126)
Bilirubin, Direct: 0.2 mg/dL (ref 0.0–0.2)
Indirect Bilirubin: 1 mg/dL — ABNORMAL HIGH (ref 0.3–0.9)
Total Bilirubin: 1.2 mg/dL (ref 0.3–1.2)
Total Protein: 7.1 g/dL (ref 6.5–8.1)

## 2023-05-08 LAB — APTT: aPTT: 33 s (ref 24–36)

## 2023-05-08 LAB — PROTIME-INR
INR: 1.1 (ref 0.8–1.2)
Prothrombin Time: 13.9 s (ref 11.4–15.2)

## 2023-05-08 MED ORDER — VITAMIN D3 25 MCG (1000 UNIT) PO TABS
2000.0000 [IU] | ORAL_TABLET | Freq: Every day | ORAL | Status: DC
  Administered 2023-05-09 – 2023-05-14 (×6): 2000 [IU] via ORAL
  Filled 2023-05-08 (×13): qty 2

## 2023-05-08 MED ORDER — LOSARTAN POTASSIUM 50 MG PO TABS
100.0000 mg | ORAL_TABLET | Freq: Every day | ORAL | Status: DC
  Administered 2023-05-09 – 2023-05-15 (×7): 100 mg via ORAL
  Filled 2023-05-08 (×7): qty 2

## 2023-05-08 MED ORDER — LACTATED RINGERS IV BOLUS (SEPSIS)
1000.0000 mL | Freq: Once | INTRAVENOUS | Status: AC
  Administered 2023-05-08: 1000 mL via INTRAVENOUS

## 2023-05-08 MED ORDER — ONDANSETRON HCL 4 MG PO TABS: 4.0000 mg | ORAL_TABLET | Freq: Four times a day (QID) | ORAL | Status: AC | PRN

## 2023-05-08 MED ORDER — MIRTAZAPINE 15 MG PO TABS
7.5000 mg | ORAL_TABLET | Freq: Every day | ORAL | Status: DC
  Administered 2023-05-09 – 2023-05-15 (×7): 7.5 mg via ORAL
  Filled 2023-05-08 (×7): qty 1

## 2023-05-08 MED ORDER — SODIUM CHLORIDE 0.9 % IV BOLUS (SEPSIS)
500.0000 mL | Freq: Once | INTRAVENOUS | Status: AC
  Administered 2023-05-08: 500 mL via INTRAVENOUS

## 2023-05-08 MED ORDER — MIRABEGRON ER 50 MG PO TB24
50.0000 mg | ORAL_TABLET | Freq: Every day | ORAL | Status: DC
  Administered 2023-05-09 – 2023-05-15 (×7): 50 mg via ORAL
  Filled 2023-05-08 (×7): qty 1

## 2023-05-08 MED ORDER — VANCOMYCIN HCL 1750 MG/350ML IV SOLN
1750.0000 mg | Freq: Once | INTRAVENOUS | Status: AC
  Administered 2023-05-08: 1750 mg via INTRAVENOUS
  Filled 2023-05-08: qty 350

## 2023-05-08 MED ORDER — ENOXAPARIN SODIUM 40 MG/0.4ML IJ SOSY
40.0000 mg | PREFILLED_SYRINGE | INTRAMUSCULAR | Status: DC
  Administered 2023-05-09 – 2023-05-15 (×7): 40 mg via SUBCUTANEOUS
  Filled 2023-05-08 (×7): qty 0.4

## 2023-05-08 MED ORDER — HYDROCHLOROTHIAZIDE 12.5 MG PO TABS
12.5000 mg | ORAL_TABLET | Freq: Every day | ORAL | Status: DC
  Administered 2023-05-09 – 2023-05-15 (×7): 12.5 mg via ORAL
  Filled 2023-05-08 (×7): qty 1

## 2023-05-08 MED ORDER — PANTOPRAZOLE SODIUM 20 MG PO TBEC
20.0000 mg | DELAYED_RELEASE_TABLET | Freq: Every day | ORAL | Status: DC
  Administered 2023-05-09 – 2023-05-15 (×7): 20 mg via ORAL
  Filled 2023-05-08 (×7): qty 1

## 2023-05-08 MED ORDER — ONDANSETRON HCL 4 MG/2ML IJ SOLN: 4.0000 mg | Freq: Four times a day (QID) | INTRAMUSCULAR | Status: AC | PRN

## 2023-05-08 MED ORDER — METRONIDAZOLE 500 MG/100ML IV SOLN
500.0000 mg | Freq: Once | INTRAVENOUS | Status: AC
  Administered 2023-05-08: 500 mg via INTRAVENOUS
  Filled 2023-05-08: qty 100

## 2023-05-08 MED ORDER — LEVOTHYROXINE SODIUM 50 MCG PO TABS
50.0000 ug | ORAL_TABLET | Freq: Every day | ORAL | Status: DC
  Administered 2023-05-09 – 2023-05-15 (×6): 50 ug via ORAL
  Filled 2023-05-08 (×6): qty 1

## 2023-05-08 MED ORDER — ASPIRIN 81 MG PO TBEC
81.0000 mg | DELAYED_RELEASE_TABLET | Freq: Every day | ORAL | Status: DC
  Administered 2023-05-09 – 2023-05-15 (×7): 81 mg via ORAL
  Filled 2023-05-08 (×7): qty 1

## 2023-05-08 MED ORDER — SERTRALINE HCL 50 MG PO TABS
50.0000 mg | ORAL_TABLET | Freq: Every day | ORAL | Status: DC
  Administered 2023-05-09 – 2023-05-15 (×7): 50 mg via ORAL
  Filled 2023-05-08 (×7): qty 1

## 2023-05-08 MED ORDER — ACETAMINOPHEN 650 MG RE SUPP: 650.0000 mg | Freq: Four times a day (QID) | RECTAL | Status: DC | PRN

## 2023-05-08 MED ORDER — HYDRALAZINE HCL 20 MG/ML IJ SOLN: 5.0000 mg | Freq: Three times a day (TID) | INTRAMUSCULAR | Status: DC | PRN

## 2023-05-08 MED ORDER — ACETAMINOPHEN 325 MG PO TABS
650.0000 mg | ORAL_TABLET | Freq: Four times a day (QID) | ORAL | Status: DC | PRN
  Administered 2023-05-14 (×3): 650 mg via ORAL
  Filled 2023-05-08 (×3): qty 2

## 2023-05-08 MED ORDER — SODIUM CHLORIDE 0.9 % IV SOLN: INTRAVENOUS | Status: DC

## 2023-05-08 MED ORDER — CLOBETASOL PROPIONATE 0.05 % EX OINT
TOPICAL_OINTMENT | Freq: Every day | CUTANEOUS | Status: DC
  Filled 2023-05-08 (×2): qty 15

## 2023-05-08 MED ORDER — SODIUM CHLORIDE 0.9 % IV SOLN
2.0000 g | INTRAVENOUS | Status: AC
  Administered 2023-05-09 – 2023-05-13 (×5): 2 g via INTRAVENOUS
  Filled 2023-05-08 (×6): qty 20

## 2023-05-08 MED ORDER — POLYETHYLENE GLYCOL 3350 17 G PO PACK
17.0000 g | PACK | Freq: Every day | ORAL | Status: DC | PRN
  Administered 2023-05-11 – 2023-05-14 (×3): 17 g via ORAL
  Filled 2023-05-08 (×3): qty 1

## 2023-05-08 MED ORDER — VANCOMYCIN HCL IN DEXTROSE 1-5 GM/200ML-% IV SOLN
1000.0000 mg | Freq: Once | INTRAVENOUS | Status: DC
  Filled 2023-05-08: qty 200

## 2023-05-08 MED ORDER — SENNOSIDES-DOCUSATE SODIUM 8.6-50 MG PO TABS
1.0000 | ORAL_TABLET | Freq: Every evening | ORAL | Status: DC | PRN
  Administered 2023-05-13 – 2023-05-14 (×2): 1 via ORAL
  Filled 2023-05-08 (×2): qty 1

## 2023-05-08 MED ORDER — SODIUM CHLORIDE 0.9 % IV SOLN
2.0000 g | Freq: Once | INTRAVENOUS | Status: AC
  Administered 2023-05-08: 2 g via INTRAVENOUS
  Filled 2023-05-08: qty 12.5

## 2023-05-08 NOTE — Assessment & Plan Note (Signed)
Patient met sepsis criteria with increased heart rate, and leukocytosis of 12.4 with possible source of urine Blood cultures x 2 have been ordered in the ED Lactic acid was negative x 2

## 2023-05-08 NOTE — Assessment & Plan Note (Signed)
-  Home levothyroxine 50 mcg daily before breakfast resumed

## 2023-05-08 NOTE — Assessment & Plan Note (Signed)
Home clobetasol ointment application to the labia/lips nightly resumed

## 2023-05-08 NOTE — Assessment & Plan Note (Addendum)
Patient states that she can walk normally.  I asked the patient when the last time she walked, she states she does not know. I attempted to call Madily[facility] to discuss patient's baseline status listed at [phone], no pickup. I called [facility] facility at [phone], selecting the operator and there was no pickup.  I called a second number [phone], selecting operator and also no pickup  CT of the head without contrast ordered PT, OT, fall precaution

## 2023-05-08 NOTE — Assessment & Plan Note (Signed)
Present on admission Of note on [facility] review, patient completed 6-day course of Macrobid 100 mg p.o. twice daily, first dose starting on 9/11 with last dose today prior to ED presentation.

## 2023-05-08 NOTE — ED Triage Notes (Signed)
Pt comes via EMS from [facility] with c/o sepsis. Pt  states generalized weakness that started today. Pt was not able to daily acitivities. Pt is A*OX4.   BP-130/86 HR-104 RR-22 O2-94 % RA ETCO2-33 T-99.7

## 2023-05-08 NOTE — Consult Note (Signed)
PHARMACY -  BRIEF ANTIBIOTIC NOTE   Pharmacy has received consult(s) for cefepime, vancomycin from an ED provider.  The patient's profile has been reviewed for ht/wt/allergies/indication/available labs.    One time order(s) placed for  Cefepime 2 gram Vancomycin 1750 mg   Further antibiotics/pharmacy consults should be ordered by admitting physician if indicated.                       Thank you, Sharen Hones, PharmD, BCPS Clinical Pharmacist   05/08/2023  6:18 PM

## 2023-05-08 NOTE — Sepsis Progress Note (Signed)
Sepsis protocol monitored by eLink ?

## 2023-05-08 NOTE — ED Provider Notes (Signed)
[facility] Provider Note    Event Date/Time   First MD Initiated Contact with Patient 05/08/23 1737     (approximate)  History   Chief Complaint: Weakness  HPI  Margaret Francis is a 89 y.o. female with a past medical history of CVA, hypertension, presents to the emergency department from her nursing facility for worsening weakness and possible sepsis.  According to EMS they were called out to [facility] for possible sepsis patient was noted to be very weak today per staff had a temperature at the facility of 99.7 tachycardic to 104 and tachypneic to 22 meeting sepsis criteria.  However upon arrival patient is no longer febrile currently 98.4 with a respiratory rate of 18.  Patient is somnolent but will awaken to voice will occasionally attempt to answer question, but is largely unable to do so.  Patient appears very fatigued.  Physical Exam   Triage Vital Signs: ED Triage Vitals  Encounter Vitals Group     BP 05/08/23 1522 137/75     Systolic BP Percentile --      Diastolic BP Percentile --      Pulse Rate 05/08/23 1522 99     Resp 05/08/23 1522 18     Temp 05/08/23 1522 98.4 F (36.9 C)     Temp Source 05/08/23 1522 Oral     SpO2 05/08/23 1522 92 %     Weight --      Height --      Head Circumference --      Peak Flow --      Pain Score 05/08/23 1519 0     Pain Loc --      Pain Education --      Exclude from Growth Chart --     Most recent vital signs: Vitals:   05/08/23 1522  BP: 137/75  Pulse: 99  Resp: 18  Temp: 98.4 F (36.9 C)  SpO2: 92%    General: Somnolent but awakens to voice. CV:  Good peripheral perfusion.  Regular rate and rhythm around 100 bpm. Resp:  Normal effort.  Equal breath sounds bilaterally.  No obvious wheeze rales or rhonchi. Abd:  No distention.  Soft, nontender.  No rebound or guarding.  No reaction to palpation. Other:  No lower extremity edema.   ED Results / Procedures / Treatments   EKG  EKG viewed and  interpreted by myself shows a normal sinus rhythm at 99 bpm with a narrow QRS, normal axis, normal intervals, no concerning ST changes.  RADIOLOGY  I have reviewed and interpreted chest x-ray images.  No obvious consolidation on my evaluation. Radiology is read the x-ray is negative for acute abnormality.   MEDICATIONS ORDERED IN ED: Medications  lactated ringers bolus 1,000 mL (has no administration in time range)  ceFEPIme (MAXIPIME) 2 g in sodium chloride 0.9 % 100 mL IVPB (has no administration in time range)  metroNIDAZOLE (FLAGYL) IVPB 500 mg (has no administration in time range)  vancomycin (VANCOCIN) IVPB 1000 mg/200 mL premix (has no administration in time range)     IMPRESSION / MDM / ASSESSMENT AND PLAN / ED COURSE  I reviewed the triage vital signs and the nursing notes.  Patient's presentation is most consistent with acute presentation with potential threat to life or bodily function.  Patient presents the emergency department for generalized weakness.  Patient found to be borderline febrile tachypneic and tachycardic at her nursing facility.  Afebrile here but continues to be tachycardic  around 100 bpm satting around 90 to 92% with no baseline O2 requirement.  We will check labs and continue to closely monitor. Patient CBC is resulted 12.4 not meeting sepsis criteria.  I have added additional lab work cultures, we will IV hydrate and start broad-spectrum antibiotics.  Differentials quite broad but would include pneumonia, COVID, UTI among other infectious etiologies.  Patient's workup is overall reassuring.  Remainder of the lab work shows a reassuring chemistry which has mild hyperglycemia.  Urinalysis does show 6-10 white cells but no bacteria.  Patient's COVID and flu test is negative lactic acid is borderline elevated at 1.8.  Chest x-ray shows no concerning findings.  However given the patient's initial reported borderline temperature as well as initial tachycardia  tachypnea and reported weakness we will admit to the hospital service for continued antibiotics until the patient's cultures have grown out.  CRITICAL CARE Performed by: Minna Antis   Total critical care time: 30 minutes  Critical care time was exclusive of separately billable procedures and treating other patients.  Critical care was necessary to treat or prevent imminent or life-threatening deterioration.  Critical care was time spent personally by me on the following activities: development of treatment plan with patient and/or surrogate as well as nursing, discussions with consultants, evaluation of patient's response to treatment, examination of patient, obtaining history from patient or surrogate, ordering and performing treatments and interventions, ordering and review of laboratory studies, ordering and review of radiographic studies, pulse oximetry and re-evaluation of patient's condition.   FINAL CLINICAL IMPRESSION(S) / ED DIAGNOSES   Sepsis Weakness  Note:  This document was prepared using Dragon voice recognition software and may include unintentional dictation errors.   Minna Antis, MD 05/08/23 2216

## 2023-05-08 NOTE — Assessment & Plan Note (Signed)
Hydrochlorothiazide 12.5 mg p.o. daily, losartan 100 mg daily resumed for 9/18 Hydralazine 5 mg IV every 8 hours as needed for SBP greater 170, 4 days ordered

## 2023-05-08 NOTE — Code Documentation (Signed)
CODE SEPSIS - PHARMACY COMMUNICATION  **Broad Spectrum Antibiotics should be administered within 1 hour of Sepsis diagnosis**  Time Code Sepsis Called/Page Received: 1743  Antibiotics Ordered: cefepime, vancomycin, metronidazole  Time of 1st antibiotic administration: 1755  Additional action taken by pharmacy:   If necessary, Name of Provider/Nurse Contacted:     Sharen Hones ,PharmD Clinical Pharmacist  05/08/2023  6:17 PM

## 2023-05-08 NOTE — ED Notes (Signed)
Patient transported to CT 

## 2023-05-08 NOTE — Assessment & Plan Note (Signed)
-  Fall precaution

## 2023-05-08 NOTE — H&P (Addendum)
History and Physical   KYNNADI WALLOCH ZOX:[id number] DOB: 03/20/1930 DOA: 05/08/2023  PCP: Smiley Houseman, NP  Outpatient Specialists: Dr. Joycelyn Schmid Patient coming from: [facility] unit in [place] via EMS  I have personally briefly reviewed patient's old medical records in [facility] EMR.  Chief Concern:   HPI: Ms. Margaret Francis is a 89 year old female with history of hypertension, hypothyroid, GERD, depression, who presents to the emergency department for chief concerns of fever and worsening weakness.  Patient is from [facility] facility.  Per ED report the fever at [facility] was reported to be 100.0.  When EMS arrived, patient's temperature was noted to be 99.7.  Unclear if medication interventions were given at [facility].  Vitals in the ED showed temperature of 98.4, respiration rate of 16, heart rate of 102, blood pressure 129/83, SpO2 of 92% on room air.  Serum sodium is 132, potassium 4.2, chloride 97, bicarb 24, BUN of 16, serum creatinine 0.82, EGFR greater than 60, nonfasting blood glucose 176, WBC 12.4, hemoglobin 14.6, platelets of 202.  Lactic acid is 1.8, on repeat was 1.8.  UA was positive for trace leukocytes.  COVID/influenza A/influenza B/RSV PCR were negative.  ED treatment: Cefepime 2 g IV, Flagyl 500 mg IV, vancomycin, LR 1 L bolus. ---------------------------- At bedside, patient was able to tell me her name is Margaret Francis.  She states that she is in the hospital.  She states that her age is 57.  Then she states her age is 37.  She was not able to tell me the current calendar year.  She goes back to snoring.  She is arousable.  I ask why she is in the hospital, she states she does not know.  Social history: Patient lives in [facility] unit.  ROS: Unable to complete  ED Course: Discussed with emergency medicine provider, patient requiring hospitalization for chief concerns of sepsis.  Assessment/Plan  Principal Problem:   Sepsis  (HCC) Active Problems:   Abnormality of gait   Weakness   Hypertension   History of stroke   Bilateral foot-drop   Peripheral neuropathy   Vulvar pruritus   Hypothyroidism   Vulvovaginal candidiasis   Pyuria   Assessment and Plan:  * Sepsis (HCC) Patient met sepsis criteria with increased heart rate, and leukocytosis of 12.4 with possible source of urine Blood cultures x 2 have been ordered in the ED Lactic acid was negative x 2  Weakness Patient states that she can walk normally.  I asked the patient when the last time she walked, she states she does not know. I attempted to call Madilyn Hook to discuss patient's baseline status listed at [phone], no pickup. I called [facility] facility at [phone], selecting the operator and there was no pickup.  I called a second number [phone], selecting operator and also no pickup  CT of the head without contrast ordered PT, OT, fall precaution  Abnormality of gait Fall precaution  Hypertension Hydrochlorothiazide 12.5 mg p.o. daily, losartan 100 mg daily resumed for 9/18 Hydralazine 5 mg IV every 8 hours as needed for SBP greater 170, 4 days ordered  Pyuria Present on admission Of note on [facility] review, patient completed 6-day course of Macrobid 100 mg p.o. twice daily, first dose starting on 9/11 with last dose today prior to ED presentation.  Vulvovaginal candidiasis Home clobetasol ointment application to the labia/lips nightly resumed  Hypothyroidism Home levothyroxine 50 mcg daily before breakfast resumed  Chart reviewed.   DVT prophylaxis: Enoxaparin Code Status:  DNR/DNI, MOST form at bedside Diet: N.p.o. pending CT head as patient may need SLP evaluation Family Communication: Attempted to call Madilyn Hook, no pickup Disposition Plan: Pending clinical course Consults called: PT, OT Admission status: Telemetry medical, inpatient  Past Medical History:  Diagnosis Date    Abnormality of gait 03/20/2013   Bilateral foot-drop 07/28/2020   Cerebral ventriculomegaly    Gastroesophageal reflux disease    History of stroke    Right inferior cerebellum   Hypertension    Osteopenia    Peripheral neuropathy 11/29/2020   Stroke [facility])    Thyroid disease    Urinary frequency 04/19/2011   Urinary incontinence    Past Surgical History:  Procedure Laterality Date   ABDOMINAL HYSTERECTOMY     APPENDECTOMY     BACK SURGERY     x2   BIOPSY VULVA  01/19/2023   BLADDER SURGERY  2010   CHOLECYSTECTOMY     ESOPHAGUS SURGERY     tumor removal.   TONSILLECTOMY     at age32   URETHRAL SLING  2010   Social History:  reports that she has never smoked. She has never used smokeless tobacco. She reports that she does not drink alcohol and does not use drugs.  Allergies  Allergen Reactions   Demerol Nausea And Vomiting   Morphine And Codeine Nausea And Vomiting   Family History  Problem Relation Age of Onset   Diabetes Mother    Hypertension Mother    Arthritis Father        died at age 50   Breast cancer Sister 58       mastectomy   Family history: Family history reviewed and not pertinent.  Prior to Admission medications   Medication Sig Start Date End Date Taking? Authorizing Provider  acetaminophen (TYLENOL) 325 MG tablet Take 650 mg by mouth every 6 (six) hours as needed.    [provider]  aspirin 81 MG tablet Take 81 mg by mouth daily.    [provider]  Calcium Carbonate Antacid 400 MG CHEW Chew 400 mg by mouth as needed.    [provider]  CHLOROTHIAZIDE PO Take 12.5 tablets by mouth daily.    [provider]  Cholecalciferol (VITAMIN D3) 50 MCG (2000 UT) CAPS Take 2,000 Units by mouth daily.    [provider]  clobetasol ointment (TEMOVATE) 0.05 % Spread the labia/lips as wide as possible and apply half applicator full tube of medicine to the opening of the vagina qhs x 2 month, then three times  weekly  until seen in the office. 03/22/23   [place] Bing, MD  hydrochlorothiazide (HYDRODIURIL) 12.5 MG tablet Take 12.5 mg by mouth daily. 04/02/23   [provider]  levothyroxine (SYNTHROID, LEVOTHROID) 50 MCG tablet Take 50 mcg by mouth daily.    [provider]  loperamide (IMODIUM) 2 MG capsule Take 1 capsule (2 mg total) by mouth as needed for diarrhea or loose stools. 01/05/22   Hollice Espy, MD  losartan (COZAAR) 100 MG tablet Take 100 mg by mouth daily. 11/10/21   [provider]  mirabegron ER (MYRBETRIQ) 50 MG TB24 tablet Take 1 tablet (50 mg total) by mouth daily. 05/30/21   York Spaniel, MD  mirtazapine (REMERON) 15 MG tablet Take 7.5 mg by mouth daily. 12/26/21   [provider]  ondansetron (ZOFRAN) 4 MG tablet Take 4 mg by mouth every 8 (eight) hours as needed for nausea or vomiting.  [provider]  pantoprazole (PROTONIX) 20 MG tablet Take 20 mg by mouth daily.    [provider]  polyethylene glycol (MIRALAX / GLYCOLAX) 17 g packet Take 17 g by mouth daily.    [provider]  sertraline (ZOLOFT) 50 MG tablet 1 tablet 11/29/21   [provider]   Physical Exam: Vitals:   05/08/23 1900 05/08/23 1932 05/08/23 2200 05/08/23 2230  BP: 116/66  127/62 128/73  Pulse: 97  100 96  Resp: 20  19 19   Temp:  98.5 F (36.9 C)    TempSrc:      SpO2: 93%  93% 92%   Constitutional: appears frail, age-appropriate, sleepy Eyes: PERRL, lids and conjunctivae normal ENMT: Mucous membranes are dry. Posterior pharynx clear of any exudate or lesions. Age-appropriate dentition.  Unable to assess hearing Neck: normal, supple, no masses, no thyromegaly Respiratory: clear to auscultation bilaterally, no wheezing, no crackles. Normal respiratory effort. No accessory muscle use.  Cardiovascular: Regular rate and rhythm, no murmurs / rubs / gallops. No extremity edema. 2+ pedal pulses. No carotid bruits.  Abdomen: Obese  abdomen, no tenderness, no masses palpated, no hepatosplenomegaly. Bowel sounds positive.  Musculoskeletal: no clubbing / cyanosis. No joint deformity upper and lower extremities. Good ROM, no contractures, no atrophy. Normal muscle tone.  Skin: no rashes, lesions, ulcers. No induration Neurologic: Sensation intact. Strength 5/5 in all 4.  Psychiatric: Normal judgment and insight. Alert and oriented x 3. Normal mood.   EKG: independently reviewed, showing sinus rhythm with rate of 99, QTc 446  Chest x-ray on Admission: I personally reviewed and I agree with radiologist reading as below.  DG Chest Portable 1 View  Result Date: 05/08/2023 CLINICAL DATA:  Fever, weakness. EXAM: PORTABLE CHEST 1 VIEW COMPARISON:  Jan 02, 2022. FINDINGS: The heart size and mediastinal contours are within normal limits. Both lungs are clear. The visualized skeletal structures are unremarkable. IMPRESSION: No active disease. Electronically Signed   By: Lupita Raider M.D.   On: 05/08/2023 18:41    Labs on Admission: I have personally reviewed following labs  CBC: Recent Labs  Lab 05/08/23 1525  WBC 12.4*  HGB 14.6  HCT 44.4  MCV 92.3  PLT 202   Basic Metabolic Panel: Recent Labs  Lab 05/08/23 1525  NA 132*  K 4.2  CL 97*  CO2 24  GLUCOSE 176*  BUN 16  CREATININE 0.82  CALCIUM 10.9*   GFR: CrCl cannot be calculated (Unknown ideal weight.).  Liver Function Tests: Recent Labs  Lab 05/08/23 1525  AST 29  ALT 31  ALKPHOS 138*  BILITOT 1.2  PROT 7.1  ALBUMIN 3.8   Coagulation Profile: Recent Labs  Lab 05/08/23 2007  INR 1.1   Urine analysis:    Component Value Date/Time   COLORURINE YELLOW (A) 05/08/2023 1722   APPEARANCEUR CLEAR (A) 05/08/2023 1722   LABSPEC 1.021 05/08/2023 1722   PHURINE 7.0 05/08/2023 1722   GLUCOSEU NEGATIVE 05/08/2023 1722   HGBUR NEGATIVE 05/08/2023 1722   BILIRUBINUR NEGATIVE 05/08/2023 1722   BILIRUBINUR neg 04/19/2011 1221   KETONESUR NEGATIVE  05/08/2023 1722   PROTEINUR NEGATIVE 05/08/2023 1722   UROBILINOGEN neg 04/19/2011 1221   NITRITE NEGATIVE 05/08/2023 1722   LEUKOCYTESUR TRACE (A) 05/08/2023 1722   This document was prepared using Dragon Voice Recognition software and may include unintentional dictation errors.  Dr. Sedalia Muta [facility]  If 7PM-7AM, please contact overnight-coverage provider If 7AM-7PM, please contact day attending provider [email or web address]  05/08/2023,  11:23 PM

## 2023-05-08 NOTE — Hospital Course (Addendum)
Ms. Margaret Francis is a 89 year old female with history of hypertension, hypothyroid, GERD, depression, who presents to the emergency department for chief concerns of fever and worsening weakness.  Patient is from [facility] facility.  Per ED report the fever at [facility] was reported to be 100.0.  When EMS arrived, patient's temperature was noted to be 99.7.  Unclear if medication interventions were given at [facility].  Vitals in the ED showed temperature of 98.4, respiration rate of 16, heart rate of 102, blood pressure 129/83, SpO2 of 92% on room air.  Serum sodium is 132, potassium 4.2, chloride 97, bicarb 24, BUN of 16, serum creatinine 0.82, EGFR greater than 60, nonfasting blood glucose 176, WBC 12.4, hemoglobin 14.6, platelets of 202.  Lactic acid is 1.8, on repeat was 1.8.  UA was positive for trace leukocytes.  COVID/influenza A/influenza B/RSV PCR were negative.  ED treatment: Cefepime 2 g IV, Flagyl 500 mg IV, vancomycin, LR 1 L bolus.

## 2023-05-09 DIAGNOSIS — A419 Sepsis, unspecified organism: Secondary | ICD-10-CM | POA: Diagnosis not present

## 2023-05-09 MED ORDER — SODIUM CHLORIDE 0.9 % IV SOLN: INTRAVENOUS | Status: DC

## 2023-05-09 MED ORDER — HYDRALAZINE HCL 20 MG/ML IJ SOLN: 5.0000 mg | Freq: Three times a day (TID) | INTRAMUSCULAR | Status: DC | PRN

## 2023-05-09 NOTE — Evaluation (Signed)
Occupational Therapy Evaluation Patient Details Name: OTILIA KARLEN MRN: [id number] DOB: [birth year] Today's Date: 05/09/2023   History of Present Illness Ms. Aliaha Edison is a 89 year old female with history of hypertension, hypothyroid, GERD, depression, who presents to the emergency department for chief concerns of fever and worsening weakness   Clinical Impression   Patient received for OT evaluation. See flowsheet below for details of function. Generally, patient requiring MOD A x2 for bed mobility, MOD A x2 to briefly stand (10 seconds; not full stand); unable to perform functional mobility, and MOD-MAX A overall for ADLs. Patient will benefit from continued OT while in acute care.       If plan is discharge home, recommend the following: Two people to help with walking and/or transfers;A lot of help with bathing/dressing/bathroom;Assistance with cooking/housework;Direct supervision/assist for medications management;Direct supervision/assist for financial management;Help with stairs or ramp for entrance;Assist for transportation;Supervision due to cognitive status    Functional Status Assessment  Patient has had a recent decline in their functional status and demonstrates the ability to make significant improvements in function in a reasonable and predictable amount of time.  Equipment Recommendations  Other (comment) (defer to next venue of care)    Recommendations for Other Services       Precautions / Restrictions Precautions Precautions: Fall Restrictions Weight Bearing Restrictions: No      Mobility Bed Mobility Overal bed mobility: Needs Assistance Bed Mobility: Supine to Sit, Sit to Supine     Supine to sit: Mod assist, +2 for physical assistance, HOB elevated, Used rails Sit to supine: Mod assist, +2 for physical assistance   General bed mobility comments: limited by weakness/fatigue    Transfers Overall transfer level: Needs assistance Equipment used: 2  person hand held assist Transfers: Sit to/from Stand Sit to Stand: Mod assist, +2 physical assistance           General transfer comment: patient able to partially stand only briefly.      Balance Overall balance assessment: Needs assistance Sitting-balance support: Feet supported Sitting balance-Leahy Scale: Fair Sitting balance - Comments: able to sit at edge of bed several minutes with supervision to min A Postural control: Left lateral lean Standing balance support: Bilateral upper extremity supported, During functional activity, Reliant on assistive device for balance Standing balance-Leahy Scale: Poor                             ADL either performed or assessed with clinical judgement   ADL Overall ADL's : Needs assistance/impaired     Grooming: Wash/dry face;Contact guard assist;Sitting Grooming Details (indicate cue type and reason): seated EOB; intermittent assist for sitting balance         Upper Body Dressing : Moderate assistance;Sitting Upper Body Dressing Details (indicate cue type and reason): assist for doffing shirt and donning gown Lower Body Dressing: Total assistance;Bed level Lower Body Dressing Details (indicate cue type and reason): donning BIL socks               General ADL Comments: anticipate pt to be MOD-MAX A for most ADLs at this time; seated or bed level     Vision Patient Visual Report:  (unknown)       Perception         Praxis         Pertinent Vitals/Pain Pain Assessment Pain Assessment: Faces Faces Pain Scale: Hurts a little bit Pain Location: general Pain Descriptors / Indicators: Discomfort,  Grimacing Pain Intervention(s): Limited activity within patient's tolerance, Monitored during session     Extremity/Trunk Assessment Upper Extremity Assessment Upper Extremity Assessment: Generalized weakness (Able to hold washcloth and wash face)   Lower Extremity Assessment Lower Extremity Assessment:  Generalized weakness   Cervical / Trunk Assessment Cervical / Trunk Assessment: Normal   Communication Communication Communication: No apparent difficulties Cueing Techniques: Verbal cues;Gestural cues   Cognition Arousal: Alert, Lethargic Behavior During Therapy: WFL for tasks assessed/performed Overall Cognitive Status: No family/caregiver present to determine baseline cognitive functioning                                 General Comments: Pt thinks it is January 2006. When asked who is running for president, she states "Garnet Koyanagi"; when prompted for the other candidate, she states "is it Duane Boston?" When prompted that Duane Boston stepped down from the race, pt unable to state who is running.     General Comments  Pt on room air. Assisted pt to doff shirt and don gown during session. Bed pad changed during session    Exercises     Shoulder Instructions      Home Living Family/patient expects to be discharged to:: Assisted living                             Home Equipment: Rolling Walker (2 wheels)   Additional Comments: Pt lives at [facility]      Prior Functioning/Environment Prior Level of Function : Needs assist             Mobility Comments: Pt states that she lives at [facility], hasn't been there very long, and thinks she uses a RW, but poor historian ADLs Comments: Unsure; pt poor historian        OT Problem List: Decreased strength;Decreased range of motion;Decreased activity tolerance;Impaired balance (sitting and/or standing);Decreased cognition;Decreased safety awareness      OT Treatment/Interventions: Self-care/ADL training;Therapeutic exercise;Therapeutic activities    OT Goals(Current goals can be found in the care plan section) Acute Rehab OT Goals Patient Stated Goal: Get better OT Goal Formulation: With patient Time For Goal Achievement: 05/23/23 Potential to Achieve Goals: Fair ADL Goals Pt Will Perform Grooming:  with modified independence;sitting Pt Will Transfer to Toilet: with modified independence;with min assist;bedside commode Pt Will Perform Toileting - Clothing Manipulation and hygiene: with modified independence;sit to/from stand  OT Frequency: Min 1X/week    Co-evaluation PT/OT/SLP Co-Evaluation/Treatment: Yes Reason for Co-Treatment: Necessary to address cognition/behavior during functional activity;For patient/therapist safety;To address functional/ADL transfers PT goals addressed during session: Mobility/safety with mobility;Balance OT goals addressed during session: ADL's and self-care      AM-PAC OT "6 Clicks" Daily Activity     Outcome Measure Help from another person eating meals?: A Little Help from another person taking care of personal grooming?: A Little Help from another person toileting, which includes using toliet, bedpan, or urinal?: Total Help from another person bathing (including washing, rinsing, drying)?: A Lot Help from another person to put on and taking off regular upper body clothing?: A Lot Help from another person to put on and taking off regular lower body clothing?: Total 6 Click Score: 12   End of Session Nurse Communication: Mobility status  Activity Tolerance: Patient tolerated treatment well;Patient limited by fatigue Patient left: in bed;with call bell/phone within reach;with bed alarm set  OT Visit Diagnosis: Unsteadiness  on feet (R26.81);Muscle weakness (generalized) (M62.81)                Time: 1027-2536 OT Time Calculation (min): 19 min Charges:  OT General Charges $OT Visit: 1 Visit OT Evaluation $OT Eval Moderate Complexity: 1 Mod  Elzie Knisley Junie Panning, MS, OTR/L  Alvester Morin 05/09/2023, 3:45 PM

## 2023-05-09 NOTE — Evaluation (Signed)
Physical Therapy Evaluation Patient Details Name: Margaret Francis MRN: [id number] DOB: 1930-03-16 Today's Date: 05/09/2023  History of Present Illness  Margaret Francis is a 89 year old female with history of hypertension, hypothyroid, GERD, depression, who presents to the emergency department for chief concerns of fever and worsening weakness  Clinical Impression  Patient received in bed sleeping. She wakes to calling her name. Patient appears quite fatigued, required +1-2 mod assist for bed mobility.  +2 mod A for sit to stand at edge of bed. Patient unable to ambulate at this time due to fatigue and weakness. She will continue to benefit from skilled PT to improve functional mobility, strength and independence.       If plan is discharge home, recommend the following: A lot of help with walking and/or transfers;A lot of help with bathing/dressing/bathroom;Help with stairs or ramp for entrance;Assist for transportation;Direct supervision/assist for financial management;Assistance with cooking/housework;Direct supervision/assist for medications management   Can travel by private vehicle   No    Equipment Recommendations Other (comment) (TBD)  Recommendations for Other Services       Functional Status Assessment Patient has had a recent decline in their functional status and demonstrates the ability to make significant improvements in function in a reasonable and predictable amount of time.     Precautions / Restrictions Precautions Precautions: Fall Restrictions Weight Bearing Restrictions: No      Mobility  Bed Mobility Overal bed mobility: Needs Assistance Bed Mobility: Supine to Sit, Sit to Supine     Supine to sit: Mod assist, +2 for physical assistance, HOB elevated, Used rails Sit to supine: Mod assist, +2 for physical assistance   General bed mobility comments: limited by weakness/fatigue    Transfers Overall transfer level: Needs assistance Equipment used: 2  person hand held assist Transfers: Sit to/from Stand Sit to Stand: Mod assist, +2 physical assistance           General transfer comment: patient able to partially stand only briefly.    Ambulation/Gait               General Gait Details: unable this session  Stairs            Wheelchair Mobility     Tilt Bed    Modified Rankin (Stroke Patients Only)       Balance Overall balance assessment: Needs assistance Sitting-balance support: Feet supported Sitting balance-Leahy Scale: Fair Sitting balance - Comments: able to sit at edge of bed several minutes with supervision to min A Postural control: Left lateral lean Standing balance support: Bilateral upper extremity supported, During functional activity, Reliant on assistive device for balance Standing balance-Leahy Scale: Poor                               Pertinent Vitals/Pain Pain Assessment Pain Assessment: Faces Faces Pain Scale: Hurts a little bit Pain Location: general Pain Descriptors / Indicators: Discomfort, Grimacing Pain Intervention(s): Monitored during session, Repositioned    Home Living Family/patient expects to be discharged to:: Assisted living                   Additional Comments: unsure    Prior Function               Mobility Comments: unsure. Patient lethargic, poor historian. Reports she lives at [facility], has not lived there long per her report       Extremity/Trunk Assessment   Upper  Extremity Assessment Upper Extremity Assessment: Defer to OT evaluation    Lower Extremity Assessment Lower Extremity Assessment: Generalized weakness    Cervical / Trunk Assessment Cervical / Trunk Assessment: Normal  Communication   Communication Communication: No apparent difficulties Cueing Techniques: Verbal cues;Gestural cues  Cognition Arousal: Alert, Lethargic Behavior During Therapy: WFL for tasks assessed/performed Overall Cognitive Status: No  family/caregiver present to determine baseline cognitive functioning                                 General Comments: per chart patient lives in memory care at [facility]        General Comments      Exercises     Assessment/Plan    PT Assessment Patient needs continued PT services  PT Problem List Decreased strength;Decreased activity tolerance;Decreased balance;Decreased mobility;Decreased cognition;Obesity       PT Treatment Interventions DME instruction;Gait training;Functional mobility training;Therapeutic activities;Therapeutic exercise;Balance training;Patient/family education;Cognitive remediation    PT Goals (Current goals can be found in the Care Plan section)  Acute Rehab PT Goals Patient Stated Goal: none stated PT Goal Formulation: Patient unable to participate in goal setting Time For Goal Achievement: 05/21/23    Frequency Min 1X/week     Co-evaluation PT/OT/SLP Co-Evaluation/Treatment: Yes Reason for Co-Treatment: Necessary to address cognition/behavior during functional activity;For patient/therapist safety;To address functional/ADL transfers PT goals addressed during session: Mobility/safety with mobility;Balance         AM-PAC PT "6 Clicks" Mobility  Outcome Measure Help needed turning from your back to your side while in a flat bed without using bedrails?: A Lot Help needed moving from lying on your back to sitting on the side of a flat bed without using bedrails?: A Lot Help needed moving to and from a bed to a chair (including a wheelchair)?: Total Help needed standing up from a chair using your arms (e.g., wheelchair or bedside chair)?: A Lot Help needed to walk in hospital room?: Total Help needed climbing 3-5 steps with a railing? : Total 6 Click Score: 9    End of Session   Activity Tolerance: Patient limited by fatigue;Patient limited by lethargy Patient left: in bed;with call bell/phone within reach;with bed alarm  set Nurse Communication: Mobility status;Other (comment) (need for pure wick) PT Visit Diagnosis: Other abnormalities of gait and mobility (R26.89);Muscle weakness (generalized) (M62.81);Unsteadiness on feet (R26.81)    Time: 1610-9604 PT Time Calculation (min) (ACUTE ONLY): 19 min   Charges:   PT Evaluation $PT Eval Moderate Complexity: 1 Mod   PT General Charges $$ ACUTE PT VISIT: 1 Visit         Rejoice Heatwole, PT, GCS 05/09/23,2:40 PM

## 2023-05-09 NOTE — ED Notes (Signed)
RN recycled vitals and assessed pt environment

## 2023-05-09 NOTE — Progress Notes (Signed)
OT Cancellation Note  Patient Details Name: Margaret Francis MRN: [id number] DOB: December 02, 1929   Cancelled Treatment:    Reason Eval/Treat Not Completed: Fatigue/lethargy limiting ability to participate (pt grunting when OT attempting to wake her up, but not responding meaningfully)  Alvester Morin 05/09/2023, 9:00 AM

## 2023-05-09 NOTE — Progress Notes (Signed)
PROGRESS NOTE    Margaret Francis  NWG:[id number] DOB: August 15, 1930 DOA: 05/08/2023 PCP: Smiley Houseman, NP    Assessment & Plan:   Principal Problem:   Sepsis [facility]) Active Problems:   Abnormality of gait   Weakness   Hypertension   History of stroke   Bilateral foot-drop   Peripheral neuropathy   Vulvar pruritus   Hypothyroidism   Vulvovaginal candidiasis   Pyuria  Assessment and Plan: Sepsis: met criteria w/ tachycardia, leukocytosis & possible UTI. Urine cx is pending. Continue on IV rocephin. Blood cxs NGTD  Weakness: PT/OT consulted. CT head shows no acute intracranial abnormalities.   HTN: continue on losartah, HCTZ  Vulvovaginal candidiasis: continue on home clobetastol ointment    Hypothyroidism: continue on levothyroxine   Hyponatremia: continue on IVFs      DVT prophylaxis: lovenox  Code Status: DNR Family Communication: discussed pt's care w/ pt's family at bedside and answered their questions  Disposition Plan: depends on PT/OT recs   Level of care: Telemetry Medical Status is: Inpatient Remains inpatient appropriate because: severity of illness    Consultants:    Procedures:   Antimicrobials: rocephin    Subjective: Pt c/o fatigue   Objective: Vitals:   05/09/23 0200 05/09/23 0230 05/09/23 0300 05/09/23 0439  BP: (!) 122/59 108/60 (!) 106/48   Pulse: 88 85 80   Resp: (!) 28 20 19    Temp:    99.4 F (37.4 C)  TempSrc:    Axillary  SpO2: 90% 92% 93%     Intake/Output Summary (Last 24 hours) at 05/09/2023 0850 Last data filed at 05/09/2023 0028 Gross per 24 hour  Intake 2050 ml  Output --  Net 2050 ml   There were no vitals filed for this visit.  Examination:  General exam: Appears calm and comfortable  Respiratory system: Clear to auscultation. Respiratory effort normal. Cardiovascular system: S1 & S2 +. No rubs, gallops or clicks.  Gastrointestinal system: Abdomen is nondistended, soft and nontender. Normal bowel sounds  heard. Central nervous system: Alert and awake Psychiatry: Judgement and insight appears close to baseline. Flat mood and affect     Data Reviewed: I have personally reviewed following labs and imaging studies  CBC: Recent Labs  Lab 05/08/23 1525 05/09/23 0512  WBC 12.4* 9.9  HGB 14.6 12.7  HCT 44.4 37.3  MCV 92.3 90.8  PLT 202 165   Basic Metabolic Panel: Recent Labs  Lab 05/08/23 1525 05/09/23 0512  NA 132* 133*  K 4.2 4.0  CL 97* 102  CO2 24 24  GLUCOSE 176* 145*  BUN 16 17  CREATININE 0.82 0.79  CALCIUM 10.9* 10.2   GFR: CrCl cannot be calculated (Unknown ideal weight.). Liver Function Tests: Recent Labs  Lab 05/08/23 1525  AST 29  ALT 31  ALKPHOS 138*  BILITOT 1.2  PROT 7.1  ALBUMIN 3.8   No results for input(s): "LIPASE", "AMYLASE" in the last 168 hours. No results for input(s): "AMMONIA" in the last 168 hours. Coagulation Profile: Recent Labs  Lab 05/08/23 2007 05/09/23 0512  INR 1.1 1.1   Cardiac Enzymes: No results for input(s): "CKTOTAL", "CKMB", "CKMBINDEX", "TROPONINI" in the last 168 hours. BNP (last 3 results) No results for input(s): "PROBNP" in the last 8760 hours. HbA1C: No results for input(s): "HGBA1C" in the last 72 hours. CBG: No results for input(s): "GLUCAP" in the last 168 hours. Lipid Profile: No results for input(s): "CHOL", "HDL", "LDLCALC", "TRIG", "CHOLHDL", "LDLDIRECT" in the last 72 hours. Thyroid Function Tests:  No results for input(s): "TSH", "T4TOTAL", "FREET4", "T3FREE", "THYROIDAB" in the last 72 hours. Anemia Panel: No results for input(s): "VITAMINB12", "FOLATE", "FERRITIN", "TIBC", "IRON", "RETICCTPCT" in the last 72 hours. Sepsis Labs: Recent Labs  Lab 05/08/23 1521 05/08/23 2007  PROCALCITON  --  0.17  LATICACIDVEN 1.8 1.8    Recent Results (from the past 240 hour(s))  Blood culture (routine x 2)     Status: None (Preliminary result)   Collection Time: 05/08/23  3:25 PM   Specimen: BLOOD  Result  Value Ref Range Status   Specimen Description BLOOD BLOOD RIGHT HAND  Final   Special Requests   Final    BOTTLES DRAWN AEROBIC AND ANAEROBIC Blood Culture results may not be optimal due to an excessive volume of blood received in culture bottles   Culture   Final    NO GROWTH < 24 HOURS Performed at [facility], [address] [place], [place] [address]    Report Status PENDING  Incomplete  Resp panel by RT-PCR (RSV, Flu A&B, Covid) Anterior Nasal Swab     Status: None   Collection Time: 05/08/23  6:00 PM   Specimen: Anterior Nasal Swab  Result Value Ref Range Status   SARS Coronavirus 2 by RT PCR NEGATIVE NEGATIVE Final    Comment: (NOTE) SARS-CoV-2 target nucleic acids are NOT DETECTED.  The SARS-CoV-2 RNA is generally detectable in upper respiratory specimens during the acute phase of infection. The lowest concentration of SARS-CoV-2 viral copies this assay can detect is 138 copies/mL. A negative result does not preclude SARS-Cov-2 infection and should not be used as the sole basis for treatment or other patient management decisions. A negative result may occur with  improper specimen collection/handling, submission of specimen other than nasopharyngeal swab, presence of viral mutation(s) within the areas targeted by this assay, and inadequate number of viral copies(<138 copies/mL). A negative result must be combined with clinical observations, patient history, and epidemiological information. The expected result is Negative.  Fact Sheet for Patients:  BloggerCourse.com  Fact Sheet for Healthcare Providers:  SeriousBroker.it  This test is no t yet approved or cleared by the [place] FDA and  has been authorized for detection and/or diagnosis of SARS-CoV-2 by FDA under an Emergency Use Authorization (EUA). This EUA will remain  in effect (meaning this test can be used) for the duration of the COVID-19  declaration under Section 564(b)(1) of the Act, 21 U.S.C.section 360bbb-3(b)(1), unless the authorization is terminated  or revoked sooner.       Influenza A by PCR NEGATIVE NEGATIVE Final   Influenza B by PCR NEGATIVE NEGATIVE Final    Comment: (NOTE) The Xpert Xpress SARS-CoV-2/FLU/RSV plus assay is intended as an aid in the diagnosis of influenza from Nasopharyngeal swab specimens and should not be used as a sole basis for treatment. Nasal washings and aspirates are unacceptable for Xpert Xpress SARS-CoV-2/FLU/RSV testing.  Fact Sheet for Patients: BloggerCourse.com  Fact Sheet for Healthcare Providers: SeriousBroker.it  This test is not yet approved or cleared by the [place] FDA and has been authorized for detection and/or diagnosis of SARS-CoV-2 by FDA under an Emergency Use Authorization (EUA). This EUA will remain in effect (meaning this test can be used) for the duration of the COVID-19 declaration under Section 564(b)(1) of the Act, 21 U.S.C. section 360bbb-3(b)(1), unless the authorization is terminated or revoked.     Resp Syncytial Virus by PCR NEGATIVE NEGATIVE Final    Comment: (NOTE) Fact Sheet for Patients:  BloggerCourse.com  Fact Sheet for Healthcare Providers: SeriousBroker.it  This test is not yet approved or cleared by the [place] FDA and has been authorized for detection and/or diagnosis of SARS-CoV-2 by FDA under an Emergency Use Authorization (EUA). This EUA will remain in effect (meaning this test can be used) for the duration of the COVID-19 declaration under Section 564(b)(1) of the Act, 21 U.S.C. section 360bbb-3(b)(1), unless the authorization is terminated or revoked.  Performed at [facility], [address], [place], [place] [address]          Radiology Studies: CT HEAD WO CONTRAST ( )  Result Date:  05/08/2023 CLINICAL DATA:  89 year old female with history of CVA presents for worsening weakness and possible sepsis EXAM: CT HEAD WITHOUT CONTRAST TECHNIQUE: Contiguous axial images were obtained from the base of the skull through the vertex without intravenous contrast. RADIATION DOSE REDUCTION: This exam was performed according to the departmental dose-optimization program which includes automated exposure control, adjustment of the mA and/or kV according to patient size and/or use of iterative reconstruction technique. COMPARISON:  CT head 04/29/2022 FINDINGS: Brain: No intracranial hemorrhage, mass effect, or evidence of acute infarct. No hydrocephalus. No extra-axial fluid collection. Age-commensurate cerebral atrophy and chronic small vessel ischemic disease. Similar ventriculomegaly Vascular: No hyperdense vessel. Intracranial arterial calcification. Skull: No fracture or focal lesion. Chronic left nasal bone fracture. Sinuses/Orbits: No acute finding. Other: None. IMPRESSION: 1. No acute intracranial abnormality. Electronically Signed   By: Minerva Fester M.D.   On: 05/08/2023 23:28   DG Chest Portable 1 View  Result Date: 05/08/2023 CLINICAL DATA:  Fever, weakness. EXAM: PORTABLE CHEST 1 VIEW COMPARISON:  Jan 02, 2022. FINDINGS: The heart size and mediastinal contours are within normal limits. Both lungs are clear. The visualized skeletal structures are unremarkable. IMPRESSION: No active disease. Electronically Signed   By: Lupita Raider M.D.   On: 05/08/2023 18:41        Scheduled Meds:  aspirin EC  81 mg Oral Daily   cholecalciferol  2,000 Units Oral Daily   clobetasol ointment   Topical QHS   enoxaparin (LOVENOX) injection  40 mg Subcutaneous Q24H   hydrochlorothiazide  12.5 mg Oral Daily   levothyroxine  50 mcg Oral Q0600   losartan  100 mg Oral Daily   mirabegron ER  50 mg Oral Daily   mirtazapine  7.5 mg Oral Daily   pantoprazole  20 mg Oral Daily   sertraline  50 mg Oral  Daily   Continuous Infusions:  sodium chloride 125 mL/hr at 05/09/23 0049   cefTRIAXone (ROCEPHIN)  IV       LOS: 1 day      Charise Killian, MD [facility] Pager [phone]  If 7PM-7AM, please contact night-coverage [email or web address] 05/09/2023, 8:50 AM

## 2023-05-10 DIAGNOSIS — R531 Weakness: Secondary | ICD-10-CM

## 2023-05-10 LAB — MISC LABCORP TEST (SEND OUT): Labcorp test code: 4051

## 2023-05-10 LAB — BASIC METABOLIC PANEL
Anion gap: 10 (ref 5–15)
BUN: 14 mg/dL (ref 8–23)
CO2: 21 mmol/L — ABNORMAL LOW (ref 22–32)
Calcium: 10 mg/dL (ref 8.9–10.3)
Chloride: 102 mmol/L (ref 98–111)
Creatinine, Ser: 0.73 mg/dL (ref 0.44–1.00)
GFR, Estimated: >60 mL/min (ref >60)
Glucose, Bld: 120 mg/dL — ABNORMAL HIGH (ref 70–99)
Potassium: 3.5 mmol/L (ref 3.5–5.1)
Sodium: 133 mmol/L — ABNORMAL LOW (ref 135–145)

## 2023-05-10 LAB — CBC
HCT: 35.6 % — ABNORMAL LOW (ref 36.0–46.0)
Hemoglobin: 12.3 g/dL (ref 12.0–15.0)
MCH: 31.6 pg (ref 26.0–34.0)
MCHC: 34.6 g/dL (ref 30.0–36.0)
MCV: 91.5 fL (ref 80.0–100.0)
Platelets: 149 10*3/uL — ABNORMAL LOW (ref 150–400)
RBC: 3.89 MIL/uL (ref 3.87–5.11)
RDW: 13.2 % (ref 11.5–15.5)
WBC: 8.3 10*3/uL (ref 4.0–10.5)
nRBC: 0 % (ref 0.0–0.2)

## 2023-05-10 LAB — URINE CULTURE: Culture: <10000 — AB

## 2023-05-10 MED ORDER — SODIUM CHLORIDE 0.9 % IV SOLN: INTRAVENOUS | Status: AC

## 2023-05-10 NOTE — TOC Progression Note (Signed)
Transition of Care [facility]) - Progression Note    Patient Details  Name: Margaret Francis MRN: [id number] Date of Birth: Apr 06, 1930  [facility] CM/SW Contact  Marlowe Sax, RN Phone Number: 05/10/2023, 3:07 PM  Clinical Narrative:    Reached out to multiple facilities asking if they can review for a bed offer to go to [facility], once bed offers obtained will review with the patient and daughter   Expected Discharge Plan: Skilled Nursing Facility Barriers to Discharge: SNF Pending bed offer, Insurance Authorization  Expected Discharge Plan and Services                                               Social Determinants of Health (SDOH) Interventions SDOH Screenings   Tobacco Use: Low Risk  (05/08/2023)    Readmission Risk Interventions     No data to display

## 2023-05-10 NOTE — TOC Progression Note (Addendum)
Transition of Care [facility]) - Progression Note    Patient Details  Name: Margaret Francis MRN: [id number] Date of Birth: 16-Jan-1930  Transition [facility] Care [facility]) CM/SW Contact  Marlowe Sax, RN Phone Number: 05/10/2023, 11:02 AM  Clinical Narrative:    Met with the patient and her daughter in the room, they are agreeable to a bedsearch to go to STR,  Bedsearch sent fl2 completed, PASSR obtained, Will review bed offers once obtained  Expected Discharge Plan: Skilled Nursing Facility Barriers to Discharge: SNF Pending bed offer, Insurance Authorization  Expected Discharge Plan and Services                                               Social Determinants of Health (SDOH) Interventions SDOH Screenings   Tobacco Use: Low Risk  (05/08/2023)    Readmission Risk Interventions     No data to display

## 2023-05-10 NOTE — Plan of Care (Signed)
  Problem: Education: Goal: Knowledge of General Education information will improve Description: Including pain rating scale, medication(s)/side effects and non-pharmacologic comfort measures Outcome: Progressing   Problem: Activity: Goal: Risk for activity intolerance will decrease Outcome: Progressing   Problem: Nutrition: Goal: Adequate nutrition will be maintained Outcome: Progressing   

## 2023-05-10 NOTE — NC FL2 (Signed)
[place] MEDICAID FL2 LEVEL OF CARE FORM     IDENTIFICATION  Patient Name: Margaret Francis Birthdate: [birth year] Sex: female Admission Date (Current Location): 05/08/2023  [facility] and [place] Number:  Chiropodist and Address:  [facility], [address], [place], [place] [address]      Provider Number: [id number]  Attending Physician Name and Address:  Charise Killian, MD  Relative Name and Phone Number:  Herbert Seta Daughter [phone]    Current Level of Care: Hospital Recommended Level of Care: Skilled Nursing Facility Prior Approval Number:    Date Approved/Denied:   PASRR Number: [id number]  Discharge Plan: SNF    Current Diagnoses: Patient Active Problem List   Diagnosis Date Noted   Sepsis (HCC) 05/08/2023   Weakness 05/08/2023   Hypothyroidism 05/08/2023   Vulvovaginal candidiasis 05/08/2023   Pyuria 05/08/2023   Vulvar pruritus 01/19/2023   Overweight (BMI 25.0-29.9) 01/04/2022   Hypertension    History of stroke    Thyroid disease    Intestinal infection, enteritis due to rotavirus    Transaminitis    Frequent falls    Peripheral neuropathy 11/29/2020   Cerebral ventriculomegaly 11/29/2020   Bilateral foot-drop 07/28/2020   Abnormality of gait 03/20/2013   Urinary frequency 04/19/2011    Orientation RESPIRATION BLADDER Height & Weight     Self, Time, Situation, Place  Normal Continent, External catheter Weight:   Height:     BEHAVIORAL SYMPTOMS/MOOD NEUROLOGICAL BOWEL NUTRITION STATUS      Continent Diet (See dc summary)  AMBULATORY STATUS COMMUNICATION OF NEEDS Skin   Extensive Assist Verbally Normal                       Personal Care Assistance Level of Assistance  Bathing, Feeding, Dressing Bathing Assistance: Maximum assistance Feeding assistance: Limited assistance Dressing Assistance: Maximum assistance     Functional Limitations Info  Sight, Hearing, Speech Sight Info:  Impaired Hearing Info: Impaired Speech Info: Adequate    SPECIAL CARE FACTORS FREQUENCY  PT (By licensed PT), OT (By licensed OT)     PT Frequency: 5 times per week OT Frequency: 5 times per week            Contractures Contractures Info: Not present    Additional Factors Info  Code Status, Allergies Code Status Info: DNR Allergies Info: demerol, morphine           Current Medications (05/10/2023):  This is the current hospital active medication list Current Facility-Administered Medications  Medication Dose Route Frequency Provider Last Rate Last Admin   0.9 %  sodium chloride infusion   Intravenous Continuous Charise Killian, MD       acetaminophen (TYLENOL) tablet 650 mg  650 mg Oral Q6H PRN Cox, Amy N, DO       Or   acetaminophen (TYLENOL) suppository 650 mg  650 mg Rectal Q6H PRN Cox, Amy N, DO       aspirin EC tablet 81 mg  81 mg Oral Daily Cox, Amy N, DO   81 mg at 05/10/23 0949   cefTRIAXone (ROCEPHIN) 2 g in sodium chloride 0.9 % 100 mL IVPB  2 g Intravenous Q24H Cox, Amy N, DO 200 mL/hr at 05/10/23 1003 2 g at 05/10/23 1003   cholecalciferol (VITAMIN D3) tablet 2,000 Units  2,000 Units Oral Daily Cox, Amy N, DO   2,000 Units at 05/10/23 0949   clobetasol ointment (TEMOVATE) 0.05 %   Topical QHS  Cox, Amy N, DO   Given at 05/09/23 2106   enoxaparin (LOVENOX) injection 40 mg  40 mg Subcutaneous Q24H Cox, Amy N, DO   40 mg at 05/10/23 0950   hydrALAZINE (APRESOLINE) injection 5 mg  5 mg Intravenous Q8H PRN Charise Killian, MD       hydrochlorothiazide (HYDRODIURIL) tablet 12.5 mg  12.5 mg Oral Daily Cox, Amy N, DO   12.5 mg at 05/10/23 5784   levothyroxine (SYNTHROID) tablet 50 mcg  50 mcg Oral Q0600 Cox, Amy N, DO   50 mcg at 05/09/23 0538   losartan (COZAAR) tablet 100 mg  100 mg Oral Daily Cox, Amy N, DO   100 mg at 05/10/23 0948   mirabegron ER (MYRBETRIQ) tablet 50 mg  50 mg Oral Daily Cox, Amy N, DO   50 mg at 05/10/23 0949   mirtazapine (REMERON) tablet  7.5 mg  7.5 mg Oral Daily Cox, Amy N, DO   7.5 mg at 05/10/23 0948   ondansetron (ZOFRAN) tablet 4 mg  4 mg Oral Q6H PRN Cox, Amy N, DO       Or   ondansetron (ZOFRAN) injection 4 mg  4 mg Intravenous Q6H PRN Cox, Amy N, DO       pantoprazole (PROTONIX) EC tablet 20 mg  20 mg Oral Daily Cox, Amy N, DO   20 mg at 05/10/23 0949   polyethylene glycol (MIRALAX / GLYCOLAX) packet 17 g  17 g Oral Daily PRN Cox, Amy N, DO       senna-docusate (Senokot-S) tablet 1 tablet  1 tablet Oral QHS PRN Cox, Amy N, DO       sertraline (ZOLOFT) tablet 50 mg  50 mg Oral Daily Cox, Amy N, DO   50 mg at 05/10/23 6962     Discharge Medications: Please see discharge summary for a list of discharge medications.  Relevant Imaging Results:  Relevant Lab Results:   Additional Information SSN:[id number]  Marlowe Sax, RN

## 2023-05-10 NOTE — Progress Notes (Signed)
PROGRESS NOTE    Margaret Francis  ZOX:[id number] DOB: 03/12/1930 DOA: 05/08/2023 PCP: Smiley Houseman, NP    Assessment & Plan:   Principal Problem:   Sepsis [facility]) Active Problems:   Abnormality of gait   Weakness   Hypertension   History of stroke   Bilateral foot-drop   Peripheral neuropathy   Vulvar pruritus   Hypothyroidism   Vulvovaginal candidiasis   Pyuria  Assessment and Plan: Sepsis: met criteria w/ tachycardia, leukocytosis & possible UTI. Urine cx shows insignificant growth. Continue on IV rocephin. Blood cxs NGTD. Sepsis resolved  Weakness: PT/OT recs SNF. CT head shows no acute intracranial abnormalities.   HTN: continue on hydrochlorothiazide, losartan   Vulvovaginal candidiasis: continue on home clobetastol ointment    Hypothyroidism: continue on levothyroxine   Hyponatremia: stable from day prior   Thrombocytopenia: etiology unclear. Will continue to monitor   DVT prophylaxis: lovenox  Code Status: DNR Family Communication: discussed pt's care w/ pt's family at bedside and answered their questions  Disposition Plan: likely d/c to SNF   Level of care: Telemetry Medical Status is: Inpatient Remains inpatient appropriate because: severity of illness    Consultants:    Procedures:   Antimicrobials: rocephin    Subjective: Pt c/o malaise   Objective: Vitals:   05/09/23 1200 05/09/23 1724 05/09/23 2324 05/10/23 0759  BP: (!) 110/50 (!) 145/66 (!) 156/63 (!) 151/67  Pulse: 85 61 70 68  Resp: 20 20 20 18   Temp: 99.3 F (37.4 C) 97.8 F (36.6 C) 98.3 F (36.8 C) 97.7 F (36.5 C)  TempSrc: Oral     SpO2: 92% 97% 94% 94%    Intake/Output Summary (Last 24 hours) at 05/10/2023 0814 Last data filed at 05/10/2023 0330 Gross per 24 hour  Intake 1046.29 ml  Output --  Net 1046.29 ml   There were no vitals filed for this visit.  Examination:  General exam: Appears comfortable  Respiratory system: clear breath sounds  b/l Cardiovascular system: S1/S2+. No rubs or clicks  Gastrointestinal system: abd is soft, NT, ND & normal bowel sounds  Central nervous system: alert & awake. Moves all extremities Psychiatry: judgement and insight appears back to baseline. Flat mood and affect    Data Reviewed: I have personally reviewed following labs and imaging studies  CBC: Recent Labs  Lab 05/08/23 1525 05/09/23 0512 05/10/23 0200  WBC 12.4* 9.9 8.3  HGB 14.6 12.7 12.3  HCT 44.4 37.3 35.6*  MCV 92.3 90.8 91.5  PLT 202 165 149*   Basic Metabolic Panel: Recent Labs  Lab 05/08/23 1525 05/09/23 0512 05/10/23 0200  NA 132* 133* 133*  K 4.2 4.0 3.5  CL 97* 102 102  CO2 24 24 21*  GLUCOSE 176* 145* 120*  BUN 16 17 14   CREATININE 0.82 0.79 0.73  CALCIUM 10.9* 10.2 10.0   GFR: CrCl cannot be calculated (Unknown ideal weight.). Liver Function Tests: Recent Labs  Lab 05/08/23 1525  AST 29  ALT 31  ALKPHOS 138*  BILITOT 1.2  PROT 7.1  ALBUMIN 3.8   No results for input(s): "LIPASE", "AMYLASE" in the last 168 hours. No results for input(s): "AMMONIA" in the last 168 hours. Coagulation Profile: Recent Labs  Lab 05/08/23 2007 05/09/23 0512  INR 1.1 1.1   Cardiac Enzymes: No results for input(s): "CKTOTAL", "CKMB", "CKMBINDEX", "TROPONINI" in the last 168 hours. BNP (last 3 results) No results for input(s): "PROBNP" in the last 8760 hours. HbA1C: No results for input(s): "HGBA1C" in the last  72 hours. CBG: No results for input(s): "GLUCAP" in the last 168 hours. Lipid Profile: No results for input(s): "CHOL", "HDL", "LDLCALC", "TRIG", "CHOLHDL", "LDLDIRECT" in the last 72 hours. Thyroid Function Tests: No results for input(s): "TSH", "T4TOTAL", "FREET4", "T3FREE", "THYROIDAB" in the last 72 hours. Anemia Panel: No results for input(s): "VITAMINB12", "FOLATE", "FERRITIN", "TIBC", "IRON", "RETICCTPCT" in the last 72 hours. Sepsis Labs: Recent Labs  Lab 05/08/23 1521 05/08/23 2007   PROCALCITON  --  0.17  LATICACIDVEN 1.8 1.8    Recent Results (from the past 240 hour(s))  Blood culture (routine x 2)     Status: None (Preliminary result)   Collection Time: 05/08/23  3:25 PM   Specimen: BLOOD  Result Value Ref Range Status   Specimen Description BLOOD BLOOD RIGHT HAND  Final   Special Requests   Final    BOTTLES DRAWN AEROBIC AND ANAEROBIC Blood Culture results may not be optimal due to an excessive volume of blood received in culture bottles   Culture   Final    NO GROWTH 2 DAYS Performed at [facility], [address] [place], [place] [address]    Report Status PENDING  Incomplete  Resp panel by RT-PCR (RSV, Flu A&B, Covid) Anterior Nasal Swab     Status: None   Collection Time: 05/08/23  6:00 PM   Specimen: Anterior Nasal Swab  Result Value Ref Range Status   SARS Coronavirus 2 by RT PCR NEGATIVE NEGATIVE Final    Comment: (NOTE) SARS-CoV-2 target nucleic acids are NOT DETECTED.  The SARS-CoV-2 RNA is generally detectable in upper respiratory specimens during the acute phase of infection. The lowest concentration of SARS-CoV-2 viral copies this assay can detect is 138 copies/mL. A negative result does not preclude SARS-Cov-2 infection and should not be used as the sole basis for treatment or other patient management decisions. A negative result may occur with  improper specimen collection/handling, submission of specimen other than nasopharyngeal swab, presence of viral mutation(s) within the areas targeted by this assay, and inadequate number of viral copies(<138 copies/mL). A negative result must be combined with clinical observations, patient history, and epidemiological information. The expected result is Negative.  Fact Sheet for Patients:  BloggerCourse.com  Fact Sheet for Healthcare Providers:  SeriousBroker.it  This test is no t yet approved or cleared by the [place] FDA and   has been authorized for detection and/or diagnosis of SARS-CoV-2 by FDA under an Emergency Use Authorization (EUA). This EUA will remain  in effect (meaning this test can be used) for the duration of the COVID-19 declaration under Section 564(b)(1) of the Act, 21 U.S.C.section 360bbb-3(b)(1), unless the authorization is terminated  or revoked sooner.       Influenza A by PCR NEGATIVE NEGATIVE Final   Influenza B by PCR NEGATIVE NEGATIVE Final    Comment: (NOTE) The Xpert Xpress SARS-CoV-2/FLU/RSV plus assay is intended as an aid in the diagnosis of influenza from Nasopharyngeal swab specimens and should not be used as a sole basis for treatment. Nasal washings and aspirates are unacceptable for Xpert Xpress SARS-CoV-2/FLU/RSV testing.  Fact Sheet for Patients: BloggerCourse.com  Fact Sheet for Healthcare Providers: SeriousBroker.it  This test is not yet approved or cleared by the [place] FDA and has been authorized for detection and/or diagnosis of SARS-CoV-2 by FDA under an Emergency Use Authorization (EUA). This EUA will remain in effect (meaning this test can be used) for the duration of the COVID-19 declaration under Section 564(b)(1) of the Act,  21 U.S.C. section 360bbb-3(b)(1), unless the authorization is terminated or revoked.     Resp Syncytial Virus by PCR NEGATIVE NEGATIVE Final    Comment: (NOTE) Fact Sheet for Patients: BloggerCourse.com  Fact Sheet for Healthcare Providers: SeriousBroker.it  This test is not yet approved or cleared by the [place] FDA and has been authorized for detection and/or diagnosis of SARS-CoV-2 by FDA under an Emergency Use Authorization (EUA). This EUA will remain in effect (meaning this test can be used) for the duration of the COVID-19 declaration under Section 564(b)(1) of the Act, 21 U.S.C. section 360bbb-3(b)(1),  unless the authorization is terminated or revoked.  Performed at [facility], [address] [place], [place] [address]   Culture, blood (Routine X 2) w Reflex to ID Panel     Status: None (Preliminary result)   Collection Time: 05/09/23  1:05 PM   Specimen: BLOOD  Result Value Ref Range Status   Specimen Description BLOOD BLOOD RIGHT HAND  Final   Special Requests   Final    BOTTLES DRAWN AEROBIC AND ANAEROBIC Blood Culture results may not be optimal due to an excessive volume of blood received in culture bottles   Culture   Final    NO GROWTH < 24 HOURS Performed at [facility], [address], [place], [place] [address]    Report Status PENDING  Incomplete         Radiology Studies: CT HEAD WO CONTRAST ( )  Result Date: 05/08/2023 CLINICAL DATA:  89 year old female with history of CVA presents for worsening weakness and possible sepsis EXAM: CT HEAD WITHOUT CONTRAST TECHNIQUE: Contiguous axial images were obtained from the base of the skull through the vertex without intravenous contrast. RADIATION DOSE REDUCTION: This exam was performed according to the departmental dose-optimization program which includes automated exposure control, adjustment of the mA and/or kV according to patient size and/or use of iterative reconstruction technique. COMPARISON:  CT head 04/29/2022 FINDINGS: Brain: No intracranial hemorrhage, mass effect, or evidence of acute infarct. No hydrocephalus. No extra-axial fluid collection. Age-commensurate cerebral atrophy and chronic small vessel ischemic disease. Similar ventriculomegaly Vascular: No hyperdense vessel. Intracranial arterial calcification. Skull: No fracture or focal lesion. Chronic left nasal bone fracture. Sinuses/Orbits: No acute finding. Other: None. IMPRESSION: 1. No acute intracranial abnormality. Electronically Signed   By: Minerva Fester M.D.   On: 05/08/2023 23:28   DG Chest Portable 1 View  Result Date:  05/08/2023 CLINICAL DATA:  Fever, weakness. EXAM: PORTABLE CHEST 1 VIEW COMPARISON:  Jan 02, 2022. FINDINGS: The heart size and mediastinal contours are within normal limits. Both lungs are clear. The visualized skeletal structures are unremarkable. IMPRESSION: No active disease. Electronically Signed   By: Lupita Raider M.D.   On: 05/08/2023 18:41        Scheduled Meds:  aspirin EC  81 mg Oral Daily   cholecalciferol  2,000 Units Oral Daily   clobetasol ointment   Topical QHS   enoxaparin (LOVENOX) injection  40 mg Subcutaneous Q24H   hydrochlorothiazide  12.5 mg Oral Daily   levothyroxine  50 mcg Oral Q0600   losartan  100 mg Oral Daily   mirabegron ER  50 mg Oral Daily   mirtazapine  7.5 mg Oral Daily   pantoprazole  20 mg Oral Daily   sertraline  50 mg Oral Daily   Continuous Infusions:  sodium chloride 75 mL/hr at 05/10/23 0353   cefTRIAXone (ROCEPHIN)  IV Stopped (05/09/23 1012)     LOS:  2 days      Charise Killian, MD [facility] Pager [phone]  If 7PM-7AM, please contact night-coverage [email or web address] 05/10/2023, 8:14 AM

## 2023-05-11 DIAGNOSIS — R531 Weakness: Secondary | ICD-10-CM | POA: Diagnosis not present

## 2023-05-11 LAB — BASIC METABOLIC PANEL
Anion gap: 6 (ref 5–15)
BUN: 14 mg/dL (ref 8–23)
CO2: 25 mmol/L (ref 22–32)
Calcium: 10.2 mg/dL (ref 8.9–10.3)
Chloride: 102 mmol/L (ref 98–111)
Creatinine, Ser: 0.64 mg/dL (ref 0.44–1.00)
GFR, Estimated: >60 mL/min (ref >60)
Glucose, Bld: 131 mg/dL — ABNORMAL HIGH (ref 70–99)
Potassium: 3.1 mmol/L — ABNORMAL LOW (ref 3.5–5.1)
Sodium: 133 mmol/L — ABNORMAL LOW (ref 135–145)

## 2023-05-11 LAB — CBC
HCT: 35.6 % — ABNORMAL LOW (ref 36.0–46.0)
Hemoglobin: 12.3 g/dL (ref 12.0–15.0)
MCH: 31.1 pg (ref 26.0–34.0)
MCHC: 34.6 g/dL (ref 30.0–36.0)
MCV: 89.9 fL (ref 80.0–100.0)
Platelets: 160 10*3/uL (ref 150–400)
RBC: 3.96 MIL/uL (ref 3.87–5.11)
RDW: 13 % (ref 11.5–15.5)
WBC: 9 10*3/uL (ref 4.0–10.5)
nRBC: 0 % (ref 0.0–0.2)

## 2023-05-11 MED ORDER — POTASSIUM CHLORIDE CRYS ER 20 MEQ PO TBCR
40.0000 meq | EXTENDED_RELEASE_TABLET | Freq: Two times a day (BID) | ORAL | Status: AC
  Administered 2023-05-11 (×2): 40 meq via ORAL
  Filled 2023-05-11 (×2): qty 2

## 2023-05-11 NOTE — Progress Notes (Signed)
PROGRESS NOTE    Margaret Francis  WUJ:[id number] DOB: Oct 21, 1929 DOA: 05/08/2023 PCP: Smiley Houseman, NP    Assessment & Plan:   Principal Problem:   Sepsis [facility]) Active Problems:   Abnormality of gait   Weakness   Hypertension   History of stroke   Bilateral foot-drop   Peripheral neuropathy   Vulvar pruritus   Hypothyroidism   Vulvovaginal candidiasis   Pyuria  Assessment and Plan: Sepsis: met criteria w/ tachycardia, leukocytosis & possible UTI. Urine cx shows insignificant growth. Continue on IV rocephin. Blood cxs NGTD. Sepsis resolved  Weakness: PT/OT recs SNF. Waiting on insurance auth. CT head shows no acute intracranial abnormalities.   Hypokalemia: potassium given. Will check Mg in AM  HTN: continue on losartan, HCTZ  Vulvovaginal candidiasis: continue on home clobetastol ointment    Hypothyroidism: continue on synthroid  Hyponatremia: stable   Thrombocytopenia: resolved   DVT prophylaxis: lovenox  Code Status: DNR Family Communication: discussed pt's care w/ pt's family at bedside and answered their questions  Disposition Plan: likely d/c to SNF. Waiting on insurance auth   Level of care: Telemetry Medical Status is: Inpatient Remains inpatient appropriate because: waiting on insurance auth. Medically stable     Consultants:    Procedures:   Antimicrobials: rocephin    Subjective: Pt c/o fatigue   Objective: Vitals:   05/10/23 0759 05/10/23 1510 05/11/23 0042 05/11/23 0746  BP: (!) 151/67 125/60 (!) 155/65 (!) 140/67  Pulse: 68 69 67 68  Resp: 18 18 18 16   Temp: 97.7 F (36.5 C) 98.4 F (36.9 C) 98.3 F (36.8 C) 98.2 F (36.8 C)  TempSrc: Oral Oral    SpO2: 94% 96% 96% 97%    Intake/Output Summary (Last 24 hours) at 05/11/2023 0843 Last data filed at 05/11/2023 0615 Gross per 24 hour  Intake 200 ml  Output 1350 ml  Net -1150 ml   There were no vitals filed for this visit.  Examination:  General exam: appears calm &  comfortable  Respiratory system: clear breath sounds b/l  Cardiovascular system: S1 & S2+. No rubs or clicks Gastrointestinal system: abd is soft, NT, ND & normal bowel sounds  Central nervous system: alert & awake. Moves all extremities  Psychiatry: judgement and insight is at baseline. Flat mood and affect    Data Reviewed: I have personally reviewed following labs and imaging studies  CBC: Recent Labs  Lab 05/08/23 1525 05/09/23 0512 05/10/23 0200 05/11/23 0501  WBC 12.4* 9.9 8.3 9.0  HGB 14.6 12.7 12.3 12.3  HCT 44.4 37.3 35.6* 35.6*  MCV 92.3 90.8 91.5 89.9  PLT 202 165 149* 160   Basic Metabolic Panel: Recent Labs  Lab 05/08/23 1525 05/09/23 0512 05/10/23 0200 05/11/23 0501  NA 132* 133* 133* 133*  K 4.2 4.0 3.5 3.1*  CL 97* 102 102 102  CO2 24 24 21* 25  GLUCOSE 176* 145* 120* 131*  BUN 16 17 14 14   CREATININE 0.82 0.79 0.73 0.64  CALCIUM 10.9* 10.2 10.0 10.2   GFR: CrCl cannot be calculated (Unknown ideal weight.). Liver Function Tests: Recent Labs  Lab 05/08/23 1525  AST 29  ALT 31  ALKPHOS 138*  BILITOT 1.2  PROT 7.1  ALBUMIN 3.8   No results for input(s): "LIPASE", "AMYLASE" in the last 168 hours. No results for input(s): "AMMONIA" in the last 168 hours. Coagulation Profile: Recent Labs  Lab 05/08/23 2007 05/09/23 0512  INR 1.1 1.1   Cardiac Enzymes: No results for input(s): "  CKTOTAL", "CKMB", "CKMBINDEX", "TROPONINI" in the last 168 hours. BNP (last 3 results) No results for input(s): "PROBNP" in the last 8760 hours. HbA1C: No results for input(s): "HGBA1C" in the last 72 hours. CBG: No results for input(s): "GLUCAP" in the last 168 hours. Lipid Profile: No results for input(s): "CHOL", "HDL", "LDLCALC", "TRIG", "CHOLHDL", "LDLDIRECT" in the last 72 hours. Thyroid Function Tests: No results for input(s): "TSH", "T4TOTAL", "FREET4", "T3FREE", "THYROIDAB" in the last 72 hours. Anemia Panel: No results for input(s): "VITAMINB12",  "FOLATE", "FERRITIN", "TIBC", "IRON", "RETICCTPCT" in the last 72 hours. Sepsis Labs: Recent Labs  Lab 05/08/23 1521 05/08/23 2007  PROCALCITON  --  0.17  LATICACIDVEN 1.8 1.8    Recent Results (from the past 240 hour(s))  Blood culture (routine x 2)     Status: None (Preliminary result)   Collection Time: 05/08/23  3:25 PM   Specimen: BLOOD  Result Value Ref Range Status   Specimen Description BLOOD BLOOD RIGHT HAND  Final   Special Requests   Final    BOTTLES DRAWN AEROBIC AND ANAEROBIC Blood Culture results may not be optimal due to an excessive volume of blood received in culture bottles   Culture   Final    NO GROWTH 3 DAYS Performed at [facility], [address], [place], [place] [address]    Report Status PENDING  Incomplete  Urine Culture     Status: Abnormal   Collection Time: 05/08/23  5:22 PM   Specimen: Urine, Clean Catch  Result Value Ref Range Status   Specimen Description   Final    URINE, CLEAN CATCH Performed at [facility], [address], [place], [place] [address]    Special Requests   Final    NONE Performed at [facility], [address], [place], [place] [address]    Culture (A)  Final    <10,000 COLONIES/mL INSIGNIFICANT GROWTH Performed at [facility], [address] [place], [place] [address]    Report Status 05/10/2023 FINAL  Final  Resp panel by RT-PCR (RSV, Flu A&B, Covid) Anterior Nasal Swab     Status: None   Collection Time: 05/08/23  6:00 PM   Specimen: Anterior Nasal Swab  Result Value Ref Range Status   SARS Coronavirus 2 by RT PCR NEGATIVE NEGATIVE Final    Comment: (NOTE) SARS-CoV-2 target nucleic acids are NOT DETECTED.  The SARS-CoV-2 RNA is generally detectable in upper respiratory specimens during the acute phase of infection. The lowest concentration of SARS-CoV-2 viral copies this assay can detect is 138 copies/mL. A negative result does not preclude SARS-Cov-2 infection and should  not be used as the sole basis for treatment or other patient management decisions. A negative result may occur with  improper specimen collection/handling, submission of specimen other than nasopharyngeal swab, presence of viral mutation(s) within the areas targeted by this assay, and inadequate number of viral copies(<138 copies/mL). A negative result must be combined with clinical observations, patient history, and epidemiological information. The expected result is Negative.  Fact Sheet for Patients:  BloggerCourse.com  Fact Sheet for Healthcare Providers:  SeriousBroker.it  This test is no t yet approved or cleared by the [place] FDA and  has been authorized for detection and/or diagnosis of SARS-CoV-2 by FDA under an Emergency Use Authorization (EUA). This EUA will remain  in effect (meaning this test can be used) for the duration of the COVID-19 declaration under Section 564(b)(1) of the Act, 21 U.S.C.section 360bbb-3(b)(1), unless the  authorization is terminated  or revoked sooner.       Influenza A by PCR NEGATIVE NEGATIVE Final   Influenza B by PCR NEGATIVE NEGATIVE Final    Comment: (NOTE) The Xpert Xpress SARS-CoV-2/FLU/RSV plus assay is intended as an aid in the diagnosis of influenza from Nasopharyngeal swab specimens and should not be used as a sole basis for treatment. Nasal washings and aspirates are unacceptable for Xpert Xpress SARS-CoV-2/FLU/RSV testing.  Fact Sheet for Patients: BloggerCourse.com  Fact Sheet for Healthcare Providers: SeriousBroker.it  This test is not yet approved or cleared by the [place] FDA and has been authorized for detection and/or diagnosis of SARS-CoV-2 by FDA under an Emergency Use Authorization (EUA). This EUA will remain in effect (meaning this test can be used) for the duration of the COVID-19 declaration under  Section 564(b)(1) of the Act, 21 U.S.C. section 360bbb-3(b)(1), unless the authorization is terminated or revoked.     Resp Syncytial Virus by PCR NEGATIVE NEGATIVE Final    Comment: (NOTE) Fact Sheet for Patients: BloggerCourse.com  Fact Sheet for Healthcare Providers: SeriousBroker.it  This test is not yet approved or cleared by the [place] FDA and has been authorized for detection and/or diagnosis of SARS-CoV-2 by FDA under an Emergency Use Authorization (EUA). This EUA will remain in effect (meaning this test can be used) for the duration of the COVID-19 declaration under Section 564(b)(1) of the Act, 21 U.S.C. section 360bbb-3(b)(1), unless the authorization is terminated or revoked.  Performed at [facility], [address] [place], [place] [address]   Culture, blood (Routine X 2) w Reflex to ID Panel     Status: None (Preliminary result)   Collection Time: 05/09/23  1:05 PM   Specimen: BLOOD  Result Value Ref Range Status   Specimen Description BLOOD BLOOD RIGHT HAND  Final   Special Requests   Final    BOTTLES DRAWN AEROBIC AND ANAEROBIC Blood Culture results may not be optimal due to an excessive volume of blood received in culture bottles   Culture   Final    NO GROWTH 2 DAYS Performed at [facility], [address] [place], [place] [address]    Report Status PENDING  Incomplete         Radiology Studies: No results found.      Scheduled Meds:  aspirin EC  81 mg Oral Daily   cholecalciferol  2,000 Units Oral Daily   clobetasol ointment   Topical QHS   enoxaparin (LOVENOX) injection  40 mg Subcutaneous Q24H   hydrochlorothiazide  12.5 mg Oral Daily   levothyroxine  50 mcg Oral Q0600   losartan  100 mg Oral Daily   mirabegron ER  50 mg Oral Daily   mirtazapine  7.5 mg Oral Daily   pantoprazole  20 mg Oral Daily   potassium chloride  40 mEq Oral BID   sertraline  50 mg  Oral Daily   Continuous Infusions:  cefTRIAXone (ROCEPHIN)  IV 2 g (05/10/23 1003)     LOS: 3 days      Charise Killian, MD [facility] Pager [phone]  If 7PM-7AM, please contact night-coverage [email or web address] 05/11/2023, 8:43 AM

## 2023-05-11 NOTE — Plan of Care (Signed)
  Problem: Respiratory: Goal: Ability to maintain adequate ventilation will improve Outcome: Progressing   Problem: Nutrition: Goal: Adequate nutrition will be maintained Outcome: Progressing   Problem: Activity: Goal: Risk for activity intolerance will decrease Outcome: Progressing   Problem: Pain Managment: Goal: General experience of comfort will improve Outcome: Progressing   Problem: Safety: Goal: Ability to remain free from injury will improve Outcome: Progressing   Problem: Skin Integrity: Goal: Risk for impaired skin integrity will decrease Outcome: Progressing

## 2023-05-11 NOTE — TOC Progression Note (Signed)
Transition of Care [facility]) - Progression Note    Patient Details  Name: Margaret Francis MRN: [id number] Date of Birth: 03-29-1930  Transition of Care [facility]) CM/SW Contact  Marlowe Sax, RN Phone Number: 05/11/2023, 10:20 AM  Clinical Narrative:     Met with the patient and Herbert Seta in the room and reviewed Bed offers, they chose [facility] pending, I notified Tiffany at [facility]   Expected Discharge Plan: Skilled Nursing Facility Barriers to Discharge: SNF Pending bed offer, Insurance Authorization  Expected Discharge Plan and Services                                               Social Determinants of Health (SDOH) Interventions SDOH Screenings   Tobacco Use: Low Risk  (05/08/2023)    Readmission Risk Interventions     No data to display

## 2023-05-11 NOTE — Progress Notes (Signed)
Physical Therapy Treatment Patient Details Name: Margaret Francis MRN: [id number] DOB: 1930-06-19 Today's Date: 05/11/2023   History of Present Illness Ms. Margaret Francis is a 89 year old female with history of hypertension, hypothyroid, GERD, depression, who presents to the emergency department for chief concerns of fever and worsening weakness    PT Comments  Pt received in bed, long time family friend at bedside to provide pt's PLOF. At baseline, pt requires assist to squat pivot to w/c and has not ambulated in ~1 year after dx with Norovirus. Pt also has baseline dementia, increased due to recent UTI. Pt agreed to PT session requiring MaxA for supine<>sit. Pt sat EOB 15+ minutes initially requiring ModA to prevent L lateral and anterior LOB, progressing to MinA and repeated cues to correct forward lean. Pt fatigued with sitting and was assisted back to bed to finish self feeding lunch. Recommendations for STR upon d/c remain appropriate.    If plan is discharge home, recommend the following: A lot of help with walking and/or transfers;A lot of help with bathing/dressing/bathroom;Help with stairs or ramp for entrance;Assist for transportation;Direct supervision/assist for financial management;Assistance with cooking/housework;Direct supervision/assist for medications management   Can travel by private vehicle     No  Equipment Recommendations  Other (comment) (TBD at next level of care)    Recommendations for Other Services       Precautions / Restrictions Precautions Precautions: Fall Restrictions Weight Bearing Restrictions: No     Mobility  Bed Mobility Overal bed mobility: Needs Assistance Bed Mobility: Supine to Sit, Sit to Supine     Supine to sit: Max assist, HOB elevated Sit to supine: Max assist   General bed mobility comments: limited by weakness/fatigue    Transfers                   General transfer comment: Pt unable to tolerate this date struggling with  sitting EOB    Ambulation/Gait               General Gait Details: Has not ambulated for ~1 year   Stairs             Wheelchair Mobility     Tilt Bed    Modified Rankin (Stroke Patients Only)       Balance Overall balance assessment: Needs assistance Sitting-balance support: Bilateral upper extremity supported, Feet supported Sitting balance-Leahy Scale: Poor Sitting balance - Comments: able to sit at edge of bed 15+ minutes with min A Postural control: Right lateral lean   Standing balance-Leahy Scale: Poor                              Cognition Arousal: Alert Behavior During Therapy: WFL for tasks assessed/performed Overall Cognitive Status: History of cognitive impairments - at baseline                                 General Comments: POA present stating pt has hx of dementia.        Exercises General Exercises - Lower Extremity Ankle Circles/Pumps: AROM, AAROM, Both, 10 reps, Supine Heel Slides: AAROM, Both, 10 reps, Supine Hip ABduction/ADduction: AAROM, Both, 10 reps, Supine    General Comments General comments (skin integrity, edema, etc.):  (Pt cooperative during session, flat affect, able to follow single step commands with increased time and assist)      Pertinent Vitals/Pain  Pain Assessment Pain Assessment: Faces Faces Pain Scale: Hurts a little bit Pain Location: general Pain Descriptors / Indicators: Discomfort, Grimacing Pain Intervention(s): Limited activity within patient's tolerance    Home Living                          Prior Function            PT Goals (current goals can now be found in the care plan section) Acute Rehab PT Goals Patient Stated Goal: none stated    Frequency    Min 1X/week      PT Plan      Co-evaluation              AM-PAC PT "6 Clicks" Mobility   Outcome Measure  Help needed turning from your back to your side while in a flat bed without  using bedrails?: A Lot Help needed moving from lying on your back to sitting on the side of a flat bed without using bedrails?: A Lot Help needed moving to and from a bed to a chair (including a wheelchair)?: Total Help needed standing up from a chair using your arms (e.g., wheelchair or bedside chair)?: A Lot Help needed to walk in hospital room?: Total Help needed climbing 3-5 steps with a railing? : Total 6 Click Score: 9    End of Session   Activity Tolerance: Patient limited by fatigue;Patient limited by lethargy Patient left: in bed;with call bell/phone within reach;with bed alarm set;with family/visitor present Nurse Communication: Mobility status;Other (comment) PT Visit Diagnosis: Other abnormalities of gait and mobility (R26.89);Muscle weakness (generalized) (M62.81);Unsteadiness on feet (R26.81)     Time: 1610-9604 PT Time Calculation (min) (ACUTE ONLY): 31 min  Charges:    $Therapeutic Exercise: 8-22 mins $Therapeutic Activity: 8-22 mins PT General Charges $$ ACUTE PT VISIT: 1 Visit                    Zadie Cleverly, PTA  Jannet Askew 05/11/2023, 1:09 PM

## 2023-05-11 NOTE — Plan of Care (Signed)
  Problem: Fluid Volume: Goal: Hemodynamic stability will improve Outcome: Progressing   Problem: Clinical Measurements: Goal: Diagnostic test results will improve Outcome: Progressing   Problem: Respiratory: Goal: Ability to maintain adequate ventilation will improve Outcome: Progressing  Patient is disoriented to time and situation but rested most of the shift.

## 2023-05-11 NOTE — Care Management Important Message (Signed)
Important Message  Patient Details  Name: Margaret Francis MRN: [id number] Date of Birth: June 05, 1930   Medicare Important Message Given:  N/A - LOS <3 / Initial given by admissions     [name] 05/11/2023, 10:40 AM

## 2023-05-12 DIAGNOSIS — R531 Weakness: Secondary | ICD-10-CM | POA: Diagnosis not present

## 2023-05-12 LAB — CBC
HCT: 37.8 % (ref 36.0–46.0)
Hemoglobin: 12.6 g/dL (ref 12.0–15.0)
MCH: 30.1 pg (ref 26.0–34.0)
MCHC: 33.3 g/dL (ref 30.0–36.0)
MCV: 90.2 fL (ref 80.0–100.0)
Platelets: 205 10*3/uL (ref 150–400)
RBC: 4.19 MIL/uL (ref 3.87–5.11)
RDW: 13.1 % (ref 11.5–15.5)
WBC: 9.2 10*3/uL (ref 4.0–10.5)
nRBC: 0 % (ref 0.0–0.2)

## 2023-05-12 LAB — BASIC METABOLIC PANEL
Anion gap: 9 (ref 5–15)
BUN: 14 mg/dL (ref 8–23)
CO2: 21 mmol/L — ABNORMAL LOW (ref 22–32)
Calcium: 10.6 mg/dL — ABNORMAL HIGH (ref 8.9–10.3)
Chloride: 102 mmol/L (ref 98–111)
Creatinine, Ser: 0.66 mg/dL (ref 0.44–1.00)
GFR, Estimated: >60 mL/min (ref >60)
Glucose, Bld: 145 mg/dL — ABNORMAL HIGH (ref 70–99)
Potassium: 4.1 mmol/L (ref 3.5–5.1)
Sodium: 132 mmol/L — ABNORMAL LOW (ref 135–145)

## 2023-05-12 LAB — MAGNESIUM: Magnesium: 2 mg/dL (ref 1.7–2.4)

## 2023-05-12 NOTE — Plan of Care (Signed)
  Problem: Fluid Volume: Goal: Hemodynamic stability will improve Outcome: Progressing   Problem: Clinical Measurements: Goal: Diagnostic test results will improve Outcome: Progressing   Problem: Respiratory: Goal: Ability to maintain adequate ventilation will improve Outcome: Progressing   Problem: Clinical Measurements: Goal: Cardiovascular complication will be avoided Outcome: Progressing   Problem: Nutrition: Goal: Adequate nutrition will be maintained Outcome: Progressing   Problem: Coping: Goal: Level of anxiety will decrease Outcome: Progressing   Problem: Elimination: Goal: Will not experience complications related to urinary retention Outcome: Progressing   Problem: Pain Managment: Goal: General experience of comfort will improve Outcome: Progressing   Problem: Safety: Goal: Ability to remain free from injury will improve Outcome: Progressing   Problem: Skin Integrity: Goal: Risk for impaired skin integrity will decrease Outcome: Progressing

## 2023-05-12 NOTE — Progress Notes (Signed)
PROGRESS NOTE    Margaret Francis  ZOX:[id number] DOB: 1930-04-09 DOA: 05/08/2023 PCP: Smiley Houseman, NP    Assessment & Plan:   Principal Problem:   Sepsis [facility]) Active Problems:   Abnormality of gait   Weakness   Hypertension   History of stroke   Bilateral foot-drop   Peripheral neuropathy   Vulvar pruritus   Hypothyroidism   Vulvovaginal candidiasis   Pyuria  Assessment and Plan: Sepsis: met criteria w/ tachycardia, leukocytosis & possible UTI. Urine cx shows insignificant growth. Continue on IV rocephin x 5 days total.Blood cxs NGTD. Sepsis resolved  Weakness: PT/OT recs SNF. Waiting on insurance auth still CT head shows no acute intracranial abnormalities.   Hypokalemia: WNL today. Mg is WNL   HTN: continue on hydrochlorothiazide, losartan   Vulvovaginal candidiasis: continue on home clobetastol ointment    Hypothyroidism: continue on levothyroxine   Hyponatremia: labile. Will continue to monitor   Thrombocytopenia: resolved   DVT prophylaxis: lovenox  Code Status: DNR Family Communication:  Disposition Plan: likely d/c to SNF. Waiting on insurance auth   Level of care: Telemetry Medical Status is: Inpatient Remains inpatient appropriate because: waiting on insurance auth still. Medically stable     Consultants:    Procedures:   Antimicrobials: rocephin    Subjective: Pt c/o malaise   Objective: Vitals:   05/11/23 0042 05/11/23 0746 05/11/23 1623 05/11/23 2340  BP: (!) 155/65 (!) 140/67 (!) 124/58 127/76  Pulse: 67 68 66 77  Resp: 18 16 16 18   Temp: 98.3 F (36.8 C) 98.2 F (36.8 C) 98 F (36.7 C) 98.2 F (36.8 C)  TempSrc:      SpO2: 96% 97% 93% 94%    Intake/Output Summary (Last 24 hours) at 05/12/2023 0816 Last data filed at 05/11/2023 2344 Gross per 24 hour  Intake 720 ml  Output 950 ml  Net -230 ml   There were no vitals filed for this visit.  Examination:  General exam: appears comfortable  Respiratory system: clear  breath sounds b/l  Cardiovascular system: S1/S2+. No rubs or clicks  Gastrointestinal system: abd is soft, NT, ND, normal bowel sounds  Central nervous system: alert & awake. Moves all extremities  Psychiatry: judgement and insight appears poor. Flat mood and affect     Data Reviewed: I have personally reviewed following labs and imaging studies  CBC: Recent Labs  Lab 05/08/23 1525 05/09/23 0512 05/10/23 0200 05/11/23 0501 05/12/23 0353  WBC 12.4* 9.9 8.3 9.0 9.2  HGB 14.6 12.7 12.3 12.3 12.6  HCT 44.4 37.3 35.6* 35.6* 37.8  MCV 92.3 90.8 91.5 89.9 90.2  PLT 202 165 149* 160 205   Basic Metabolic Panel: Recent Labs  Lab 05/08/23 1525 05/09/23 0512 05/10/23 0200 05/11/23 0501 05/12/23 0353  NA 132* 133* 133* 133* 132*  K 4.2 4.0 3.5 3.1* 4.1  CL 97* 102 102 102 102  CO2 24 24 21* 25 21*  GLUCOSE 176* 145* 120* 131* 145*  BUN 16 17 14 14 14   CREATININE 0.82 0.79 0.73 0.64 0.66  CALCIUM 10.9* 10.2 10.0 10.2 10.6*  MG  --   --   --   --  2.0   GFR: CrCl cannot be calculated (Unknown ideal weight.). Liver Function Tests: Recent Labs  Lab 05/08/23 1525  AST 29  ALT 31  ALKPHOS 138*  BILITOT 1.2  PROT 7.1  ALBUMIN 3.8   No results for input(s): "LIPASE", "AMYLASE" in the last 168 hours. No results for input(s): "AMMONIA" in  the last 168 hours. Coagulation Profile: Recent Labs  Lab 05/08/23 2007 05/09/23 0512  INR 1.1 1.1   Cardiac Enzymes: No results for input(s): "CKTOTAL", "CKMB", "CKMBINDEX", "TROPONINI" in the last 168 hours. BNP (last 3 results) No results for input(s): "PROBNP" in the last 8760 hours. HbA1C: No results for input(s): "HGBA1C" in the last 72 hours. CBG: No results for input(s): "GLUCAP" in the last 168 hours. Lipid Profile: No results for input(s): "CHOL", "HDL", "LDLCALC", "TRIG", "CHOLHDL", "LDLDIRECT" in the last 72 hours. Thyroid Function Tests: No results for input(s): "TSH", "T4TOTAL", "FREET4", "T3FREE", "THYROIDAB" in  the last 72 hours. Anemia Panel: No results for input(s): "VITAMINB12", "FOLATE", "FERRITIN", "TIBC", "IRON", "RETICCTPCT" in the last 72 hours. Sepsis Labs: Recent Labs  Lab 05/08/23 1521 05/08/23 2007  PROCALCITON  --  0.17  LATICACIDVEN 1.8 1.8    Recent Results (from the past 240 hour(s))  Blood culture (routine x 2)     Status: None (Preliminary result)   Collection Time: 05/08/23  3:25 PM   Specimen: BLOOD  Result Value Ref Range Status   Specimen Description BLOOD BLOOD RIGHT HAND  Final   Special Requests   Final    BOTTLES DRAWN AEROBIC AND ANAEROBIC Blood Culture results may not be optimal due to an excessive volume of blood received in culture bottles   Culture   Final    NO GROWTH 4 DAYS Performed at [facility], [address] [place], [place] [address]    Report Status PENDING  Incomplete  Urine Culture     Status: Abnormal   Collection Time: 05/08/23  5:22 PM   Specimen: Urine, Clean Catch  Result Value Ref Range Status   Specimen Description   Final    URINE, CLEAN CATCH Performed at [facility], [address] [place], [place] [address]    Special Requests   Final    NONE Performed at [facility], [address] [place], [place] [address]    Culture (A)  Final    <10,000 COLONIES/mL INSIGNIFICANT GROWTH Performed at [facility], [address] [place], [place] [address]    Report Status 05/10/2023 FINAL  Final  Resp panel by RT-PCR (RSV, Flu A&B, Covid) Anterior Nasal Swab     Status: None   Collection Time: 05/08/23  6:00 PM   Specimen: Anterior Nasal Swab  Result Value Ref Range Status   SARS Coronavirus 2 by RT PCR NEGATIVE NEGATIVE Final    Comment: (NOTE) SARS-CoV-2 target nucleic acids are NOT DETECTED.  The SARS-CoV-2 RNA is generally detectable in upper respiratory specimens during the acute phase of infection. The lowest concentration of SARS-CoV-2 viral copies this assay can detect is 138  copies/mL. A negative result does not preclude SARS-Cov-2 infection and should not be used as the sole basis for treatment or other patient management decisions. A negative result may occur with  improper specimen collection/handling, submission of specimen other than nasopharyngeal swab, presence of viral mutation(s) within the areas targeted by this assay, and inadequate number of viral copies(<138 copies/mL). A negative result must be combined with clinical observations, patient history, and epidemiological information. The expected result is Negative.  Fact Sheet for Patients:  BloggerCourse.com  Fact Sheet for Healthcare Providers:  SeriousBroker.it  This test is no t yet approved or cleared by the [place] FDA and  has been authorized for detection and/or diagnosis of SARS-CoV-2 by FDA under an Emergency Use Authorization (EUA). This EUA will remain  in effect (meaning this test can be used) for the duration of the COVID-19 declaration under Section 564(b)(1) of the Act, 21 U.S.C.section 360bbb-3(b)(1), unless the authorization is terminated  or revoked sooner.       Influenza A by PCR NEGATIVE NEGATIVE Final   Influenza B by PCR NEGATIVE NEGATIVE Final    Comment: (NOTE) The Xpert Xpress SARS-CoV-2/FLU/RSV plus assay is intended as an aid in the diagnosis of influenza from Nasopharyngeal swab specimens and should not be used as a sole basis for treatment. Nasal washings and aspirates are unacceptable for Xpert Xpress SARS-CoV-2/FLU/RSV testing.  Fact Sheet for Patients: BloggerCourse.com  Fact Sheet for Healthcare Providers: SeriousBroker.it  This test is not yet approved or cleared by the [place] FDA and has been authorized for detection and/or diagnosis of SARS-CoV-2 by FDA under an Emergency Use Authorization (EUA). This EUA will remain in effect (meaning  this test can be used) for the duration of the COVID-19 declaration under Section 564(b)(1) of the Act, 21 U.S.C. section 360bbb-3(b)(1), unless the authorization is terminated or revoked.     Resp Syncytial Virus by PCR NEGATIVE NEGATIVE Final    Comment: (NOTE) Fact Sheet for Patients: BloggerCourse.com  Fact Sheet for Healthcare Providers: SeriousBroker.it  This test is not yet approved or cleared by the [place] FDA and has been authorized for detection and/or diagnosis of SARS-CoV-2 by FDA under an Emergency Use Authorization (EUA). This EUA will remain in effect (meaning this test can be used) for the duration of the COVID-19 declaration under Section 564(b)(1) of the Act, 21 U.S.C. section 360bbb-3(b)(1), unless the authorization is terminated or revoked.  Performed at [facility], [address] [place], [place] [address]   Culture, blood (Routine X 2) w Reflex to ID Panel     Status: None (Preliminary result)   Collection Time: 05/09/23  1:05 PM   Specimen: BLOOD  Result Value Ref Range Status   Specimen Description BLOOD BLOOD RIGHT HAND  Final   Special Requests   Final    BOTTLES DRAWN AEROBIC AND ANAEROBIC Blood Culture results may not be optimal due to an excessive volume of blood received in culture bottles   Culture   Final    NO GROWTH 3 DAYS Performed at [facility], [address], [place], [place] [address]    Report Status PENDING  Incomplete         Radiology Studies: No results found.      Scheduled Meds:  aspirin EC  81 mg Oral Daily   cholecalciferol  2,000 Units Oral Daily   clobetasol ointment   Topical QHS   enoxaparin (LOVENOX) injection  40 mg Subcutaneous Q24H   hydrochlorothiazide  12.5 mg Oral Daily   levothyroxine  50 mcg Oral Q0600   losartan  100 mg Oral Daily   mirabegron ER  50 mg Oral Daily   mirtazapine  7.5 mg Oral Daily   pantoprazole  20  mg Oral Daily   sertraline  50 mg Oral Daily   Continuous Infusions:  cefTRIAXone (ROCEPHIN)  IV 2 g (05/11/23 1021)     LOS: 4 days      Charise Killian, MD [facility] Pager [phone]  If 7PM-7AM, please contact night-coverage [email or web address] 05/12/2023, 8:16 AM

## 2023-05-12 NOTE — Plan of Care (Signed)

## 2023-05-13 ENCOUNTER — Inpatient Hospital Stay: Payer: Medicare Other

## 2023-05-13 DIAGNOSIS — R531 Weakness: Secondary | ICD-10-CM | POA: Diagnosis not present

## 2023-05-13 LAB — BASIC METABOLIC PANEL
Anion gap: 7 (ref 5–15)
BUN: 16 mg/dL (ref 8–23)
CO2: 23 mmol/L (ref 22–32)
Calcium: 11.4 mg/dL — ABNORMAL HIGH (ref 8.9–10.3)
Chloride: 102 mmol/L (ref 98–111)
Creatinine, Ser: 0.67 mg/dL (ref 0.44–1.00)
GFR, Estimated: >60 mL/min (ref >60)
Glucose, Bld: 160 mg/dL — ABNORMAL HIGH (ref 70–99)
Potassium: 3.9 mmol/L (ref 3.5–5.1)
Sodium: 132 mmol/L — ABNORMAL LOW (ref 135–145)

## 2023-05-13 LAB — CBC
HCT: 35.5 % — ABNORMAL LOW (ref 36.0–46.0)
Hemoglobin: 12.1 g/dL (ref 12.0–15.0)
MCH: 31.1 pg (ref 26.0–34.0)
MCHC: 34.1 g/dL (ref 30.0–36.0)
MCV: 91.3 fL (ref 80.0–100.0)
Platelets: 215 10*3/uL (ref 150–400)
RBC: 3.89 MIL/uL (ref 3.87–5.11)
RDW: 13.1 % (ref 11.5–15.5)
WBC: 10 10*3/uL (ref 4.0–10.5)
nRBC: 0 % (ref 0.0–0.2)

## 2023-05-13 LAB — MAGNESIUM: Magnesium: 2.1 mg/dL (ref 1.7–2.4)

## 2023-05-13 LAB — CULTURE, BLOOD (ROUTINE X 2): Culture: NO GROWTH

## 2023-05-13 MED ORDER — OXYCODONE-ACETAMINOPHEN 5-325 MG PO TABS
1.0000 | ORAL_TABLET | Freq: Four times a day (QID) | ORAL | Status: DC | PRN
  Administered 2023-05-13 – 2023-05-15 (×2): 1 via ORAL
  Filled 2023-05-13 (×2): qty 1

## 2023-05-13 MED ORDER — HYDROMORPHONE HCL 1 MG/ML IJ SOLN: 0.5000 mg | INTRAMUSCULAR | Status: DC | PRN

## 2023-05-13 MED ORDER — SODIUM CHLORIDE 0.9 % IV BOLUS
1000.0000 mL | Freq: Once | INTRAVENOUS | Status: AC
  Administered 2023-05-13: 1000 mL via INTRAVENOUS

## 2023-05-13 NOTE — Plan of Care (Signed)

## 2023-05-13 NOTE — TOC Progression Note (Signed)
Transition of Care [facility]) - Progression Note    Patient Details  Name: JAELLE FIERSTEIN MRN: [id number] Date of Birth: 01-Mar-1930  [facility] CM/SW Contact  Susa Simmonds, [place] Phone Number: 05/13/2023, 10:10 AM  Clinical Narrative:   Patients insurance Berkley Harvey was approved in [facility] ID, [id number]). CSW contacted Tiffany in admissions at [facility]. CSW received no answer. CSW sent a message notifying auth approval.     Expected Discharge Plan: Skilled Nursing Facility Barriers to Discharge: SNF Pending bed offer, Insurance Authorization  Expected Discharge Plan and Services                                               Social Determinants of Health (SDOH) Interventions SDOH Screenings   Tobacco Use: Low Risk  (05/08/2023)    Readmission Risk Interventions     No data to display

## 2023-05-13 NOTE — Plan of Care (Signed)
  Problem: Fluid Volume: Goal: Hemodynamic stability will improve Outcome: Progressing   Problem: Clinical Measurements: Goal: Diagnostic test results will improve Outcome: Progressing   Problem: Respiratory: Goal: Ability to maintain adequate ventilation will improve Outcome: Progressing   Problem: Clinical Measurements: Goal: Will remain free from infection Outcome: Progressing Goal: Diagnostic test results will improve Outcome: Progressing Goal: Respiratory complications will improve Outcome: Progressing Goal: Cardiovascular complication will be avoided Outcome: Progressing   Problem: Nutrition: Goal: Adequate nutrition will be maintained Outcome: Progressing   Problem: Coping: Goal: Level of anxiety will decrease Outcome: Progressing   Problem: Elimination: Goal: Will not experience complications related to urinary retention Outcome: Progressing   Problem: Pain Managment: Goal: General experience of comfort will improve Outcome: Progressing   Problem: Safety: Goal: Ability to remain free from injury will improve Outcome: Progressing   Problem: Skin Integrity: Goal: Risk for impaired skin integrity will decrease Outcome: Progressing

## 2023-05-13 NOTE — Progress Notes (Signed)
PROGRESS NOTE    Margaret Francis  ION:[id number] DOB: 1929-11-15 DOA: 05/08/2023 PCP: Smiley Houseman, NP    Assessment & Plan:   Principal Problem:   Sepsis [facility]) Active Problems:   Abnormality of gait   Weakness   Hypertension   History of stroke   Bilateral foot-drop   Peripheral neuropathy   Vulvar pruritus   Hypothyroidism   Vulvovaginal candidiasis   Pyuria  Assessment and Plan: Sepsis: met criteria w/ tachycardia, leukocytosis & possible UTI. Urine cx shows insignificant growth. Continue on IV rocephin x 5 days total. Blood cxs NGTD. Sepsis resolved  Weakness: PT/OT recs SNF. CT head shows no acute intracranial abnormalities.   Hypokalemia: WNL today   HTN: continue on losartan, HCTZ  Vulvovaginal candidiasis: continue on home clobetastol ointment    Hypothyroidism: continue on levothyroxine   Hyponatremia: labile. Will continue to monitor   Thrombocytopenia: resolved   DVT prophylaxis: lovenox  Code Status: DNR Family Communication:  Disposition Plan: likely d/c to SNF.    Level of care: Telemetry Medical Status is: Inpatient Remains inpatient appropriate because: Medically stable and insurance auth approved and waiting hear back from SNF as per CM     Consultants:    Procedures:   Antimicrobials: rocephin    Subjective: Pt c/o left arm pain    Objective: Vitals:   05/11/23 2340 05/12/23 0850 05/12/23 1621 05/13/23 0110  BP: 127/76 134/81 113/60 121/68  Pulse: 77 73 77 78  Resp: 18 16 16 18   Temp: 98.2 F (36.8 C) 98.1 F (36.7 C) 98.1 F (36.7 C) 98.1 F (36.7 C)  TempSrc:      SpO2: 94% 94% 97% 93%    Intake/Output Summary (Last 24 hours) at 05/13/2023 2440 Last data filed at 05/13/2023 0112 Gross per 24 hour  Intake 480 ml  Output 1300 ml  Net -820 ml   There were no vitals filed for this visit.  Examination:  General exam: appears calm and comfortable  Respiratory system: clear breath sounds b/l  Cardiovascular  system: S1 & S2+. No rubs or clicks  Gastrointestinal system: abd is soft, NT, ND & hypoactive bowel sounds  Central nervous system: alert & awake. moves all extremities  Psychiatry: judgement and insight appears at baseline. Flat mood and affect    Data Reviewed: I have personally reviewed following labs and imaging studies  CBC: Recent Labs  Lab 05/09/23 0512 05/10/23 0200 05/11/23 0501 05/12/23 0353 05/13/23 0339  WBC 9.9 8.3 9.0 9.2 10.0  HGB 12.7 12.3 12.3 12.6 12.1  HCT 37.3 35.6* 35.6* 37.8 35.5*  MCV 90.8 91.5 89.9 90.2 91.3  PLT 165 149* 160 205 215   Basic Metabolic Panel: Recent Labs  Lab 05/09/23 0512 05/10/23 0200 05/11/23 0501 05/12/23 0353 05/13/23 0339  NA 133* 133* 133* 132* 132*  K 4.0 3.5 3.1* 4.1 3.9  CL 102 102 102 102 102  CO2 24 21* 25 21* 23  GLUCOSE 145* 120* 131* 145* 160*  BUN 17 14 14 14 16   CREATININE 0.79 0.73 0.64 0.66 0.67  CALCIUM 10.2 10.0 10.2 10.6* 11.4*  MG  --   --   --  2.0 2.1   GFR: CrCl cannot be calculated (Unknown ideal weight.). Liver Function Tests: Recent Labs  Lab 05/08/23 1525  AST 29  ALT 31  ALKPHOS 138*  BILITOT 1.2  PROT 7.1  ALBUMIN 3.8   No results for input(s): "LIPASE", "AMYLASE" in the last 168 hours. No results for input(s): "AMMONIA" in  the last 168 hours. Coagulation Profile: Recent Labs  Lab 05/08/23 2007 05/09/23 0512  INR 1.1 1.1   Cardiac Enzymes: No results for input(s): "CKTOTAL", "CKMB", "CKMBINDEX", "TROPONINI" in the last 168 hours. BNP (last 3 results) No results for input(s): "PROBNP" in the last 8760 hours. HbA1C: No results for input(s): "HGBA1C" in the last 72 hours. CBG: No results for input(s): "GLUCAP" in the last 168 hours. Lipid Profile: No results for input(s): "CHOL", "HDL", "LDLCALC", "TRIG", "CHOLHDL", "LDLDIRECT" in the last 72 hours. Thyroid Function Tests: No results for input(s): "TSH", "T4TOTAL", "FREET4", "T3FREE", "THYROIDAB" in the last 72  hours. Anemia Panel: No results for input(s): "VITAMINB12", "FOLATE", "FERRITIN", "TIBC", "IRON", "RETICCTPCT" in the last 72 hours. Sepsis Labs: Recent Labs  Lab 05/08/23 1521 05/08/23 2007  PROCALCITON  --  0.17  LATICACIDVEN 1.8 1.8    Recent Results (from the past 240 hour(s))  Blood culture (routine x 2)     Status: None   Collection Time: 05/08/23  3:25 PM   Specimen: BLOOD  Result Value Ref Range Status   Specimen Description BLOOD BLOOD RIGHT HAND  Final   Special Requests   Final    BOTTLES DRAWN AEROBIC AND ANAEROBIC Blood Culture results may not be optimal due to an excessive volume of blood received in culture bottles   Culture   Final    NO GROWTH 5 DAYS Performed at [facility], [address], [place], [place] [address]    Report Status 05/13/2023 FINAL  Final  Urine Culture     Status: Abnormal   Collection Time: 05/08/23  5:22 PM   Specimen: Urine, Clean Catch  Result Value Ref Range Status   Specimen Description   Final    URINE, CLEAN CATCH Performed at [facility], [address] [place], [place] [address]    Special Requests   Final    NONE Performed at [facility], [address] [place], [place] [address]    Culture (A)  Final    <10,000 COLONIES/mL INSIGNIFICANT GROWTH Performed at [facility], [address] [place], [place] [address]    Report Status 05/10/2023 FINAL  Final  Resp panel by RT-PCR (RSV, Flu A&B, Covid) Anterior Nasal Swab     Status: None   Collection Time: 05/08/23  6:00 PM   Specimen: Anterior Nasal Swab  Result Value Ref Range Status   SARS Coronavirus 2 by RT PCR NEGATIVE NEGATIVE Final    Comment: (NOTE) SARS-CoV-2 target nucleic acids are NOT DETECTED.  The SARS-CoV-2 RNA is generally detectable in upper respiratory specimens during the acute phase of infection. The lowest concentration of SARS-CoV-2 viral copies this assay can detect is 138 copies/mL. A negative result does  not preclude SARS-Cov-2 infection and should not be used as the sole basis for treatment or other patient management decisions. A negative result may occur with  improper specimen collection/handling, submission of specimen other than nasopharyngeal swab, presence of viral mutation(s) within the areas targeted by this assay, and inadequate number of viral copies(<138 copies/mL). A negative result must be combined with clinical observations, patient history, and epidemiological information. The expected result is Negative.  Fact Sheet for Patients:  BloggerCourse.com  Fact Sheet for Healthcare Providers:  SeriousBroker.it  This test is no t yet approved or cleared by the [place] FDA and  has been authorized for detection and/or diagnosis of SARS-CoV-2 by FDA under an Emergency Use Authorization (EUA). This EUA will remain  in  effect (meaning this test can be used) for the duration of the COVID-19 declaration under Section 564(b)(1) of the Act, 21 U.S.C.section 360bbb-3(b)(1), unless the authorization is terminated  or revoked sooner.       Influenza A by PCR NEGATIVE NEGATIVE Final   Influenza B by PCR NEGATIVE NEGATIVE Final    Comment: (NOTE) The Xpert Xpress SARS-CoV-2/FLU/RSV plus assay is intended as an aid in the diagnosis of influenza from Nasopharyngeal swab specimens and should not be used as a sole basis for treatment. Nasal washings and aspirates are unacceptable for Xpert Xpress SARS-CoV-2/FLU/RSV testing.  Fact Sheet for Patients: BloggerCourse.com  Fact Sheet for Healthcare Providers: SeriousBroker.it  This test is not yet approved or cleared by the [place] FDA and has been authorized for detection and/or diagnosis of SARS-CoV-2 by FDA under an Emergency Use Authorization (EUA). This EUA will remain in effect (meaning this test can be used) for the  duration of the COVID-19 declaration under Section 564(b)(1) of the Act, 21 U.S.C. section 360bbb-3(b)(1), unless the authorization is terminated or revoked.     Resp Syncytial Virus by PCR NEGATIVE NEGATIVE Final    Comment: (NOTE) Fact Sheet for Patients: BloggerCourse.com  Fact Sheet for Healthcare Providers: SeriousBroker.it  This test is not yet approved or cleared by the [place] FDA and has been authorized for detection and/or diagnosis of SARS-CoV-2 by FDA under an Emergency Use Authorization (EUA). This EUA will remain in effect (meaning this test can be used) for the duration of the COVID-19 declaration under Section 564(b)(1) of the Act, 21 U.S.C. section 360bbb-3(b)(1), unless the authorization is terminated or revoked.  Performed at [facility], [address] [place], [place] [address]   Culture, blood (Routine X 2) w Reflex to ID Panel     Status: None (Preliminary result)   Collection Time: 05/09/23  1:05 PM   Specimen: BLOOD  Result Value Ref Range Status   Specimen Description BLOOD BLOOD RIGHT HAND  Final   Special Requests   Final    BOTTLES DRAWN AEROBIC AND ANAEROBIC Blood Culture results may not be optimal due to an excessive volume of blood received in culture bottles   Culture   Final    NO GROWTH 4 DAYS Performed at [facility], [address], [place], [place] [address]    Report Status PENDING  Incomplete         Radiology Studies: No results found.      Scheduled Meds:  aspirin EC  81 mg Oral Daily   cholecalciferol  2,000 Units Oral Daily   clobetasol ointment   Topical QHS   enoxaparin (LOVENOX) injection  40 mg Subcutaneous Q24H   hydrochlorothiazide  12.5 mg Oral Daily   levothyroxine  50 mcg Oral Q0600   losartan  100 mg Oral Daily   mirabegron ER  50 mg Oral Daily   mirtazapine  7.5 mg Oral Daily   pantoprazole  20 mg Oral Daily   sertraline  50  mg Oral Daily   Continuous Infusions:  cefTRIAXone (ROCEPHIN)  IV 2 g (05/12/23 0912)     LOS: 5 days      Charise Killian, MD [facility] Pager [phone]  If 7PM-7AM, please contact night-coverage [email or web address] 05/13/2023, 8:52 AM

## 2023-05-14 DIAGNOSIS — R531 Weakness: Secondary | ICD-10-CM | POA: Diagnosis not present

## 2023-05-14 LAB — CBC
HCT: 36.9 % (ref 36.0–46.0)
Hemoglobin: 12.4 g/dL (ref 12.0–15.0)
MCH: 30.7 pg (ref 26.0–34.0)
MCHC: 33.6 g/dL (ref 30.0–36.0)
MCV: 91.3 fL (ref 80.0–100.0)
Platelets: 233 10*3/uL (ref 150–400)
RBC: 4.04 MIL/uL (ref 3.87–5.11)
RDW: 13.2 % (ref 11.5–15.5)
WBC: 7.8 10*3/uL (ref 4.0–10.5)
nRBC: 0 % (ref 0.0–0.2)

## 2023-05-14 LAB — BASIC METABOLIC PANEL
Anion gap: 9 (ref 5–15)
BUN: 15 mg/dL (ref 8–23)
CO2: 23 mmol/L (ref 22–32)
Calcium: 11.2 mg/dL — ABNORMAL HIGH (ref 8.9–10.3)
Chloride: 101 mmol/L (ref 98–111)
Creatinine, Ser: 0.65 mg/dL (ref 0.44–1.00)
GFR, Estimated: >60 mL/min (ref >60)
Glucose, Bld: 134 mg/dL — ABNORMAL HIGH (ref 70–99)
Potassium: 4 mmol/L (ref 3.5–5.1)
Sodium: 133 mmol/L — ABNORMAL LOW (ref 135–145)

## 2023-05-14 LAB — CULTURE, BLOOD (ROUTINE X 2): Culture: NO GROWTH

## 2023-05-14 LAB — MAGNESIUM: Magnesium: 2 mg/dL (ref 1.7–2.4)

## 2023-05-14 MED ORDER — LACTULOSE 10 GM/15ML PO SOLN
30.0000 g | Freq: Two times a day (BID) | ORAL | Status: DC
  Administered 2023-05-14 (×2): 30 g via ORAL
  Filled 2023-05-14 (×3): qty 60

## 2023-05-14 MED ORDER — DOCUSATE SODIUM 100 MG PO CAPS
200.0000 mg | ORAL_CAPSULE | Freq: Two times a day (BID) | ORAL | Status: DC
  Administered 2023-05-14 – 2023-05-15 (×2): 200 mg via ORAL
  Filled 2023-05-14 (×2): qty 2

## 2023-05-14 MED ORDER — ACETAMINOPHEN 500 MG PO TABS: 1000.0000 mg | ORAL_TABLET | ORAL | Status: AC

## 2023-05-14 NOTE — Plan of Care (Signed)
Patient has been free of injury or falls. VSS. Patient denies pain or discomfort. Patient denies urinary infection symptoms. Plan is for [facility]. Nursing will continue to monitor progress towards goals.

## 2023-05-14 NOTE — TOC Progression Note (Signed)
Transition of Care [facility]) - Progression Note    Patient Details  Name: Margaret Francis MRN: [id number] Date of Birth: January 27, 1930  Transition of Care [facility]) CM/SW Contact  Marlowe Sax, RN Phone Number: 05/14/2023, 12:18 PM  Clinical Narrative:    Per Elmarie Shiley at [facility] they are not able to accept the patient, will DC tomorrow    Expected Discharge Plan: Skilled Nursing Facility Barriers to Discharge: SNF Pending bed offer, Insurance Authorization  Expected Discharge Plan and Services                                               Social Determinants of Health (SDOH) Interventions SDOH Screenings   Tobacco Use: Low Risk  (05/08/2023)    Readmission Risk Interventions     No data to display

## 2023-05-14 NOTE — Progress Notes (Signed)
Occupational Therapy Treatment Patient Details Name: Margaret Francis MRN: [id number] DOB: [birth year] Today's Date: 05/14/2023   History of present illness Ms. Sinclaire Kosin is a 89 year old female with history of hypertension, hypothyroid, GERD, depression, who presents to the emergency department for chief concerns of fever and worsening weakness   OT comments  Upon entering the room, pt supine in bed and agreeable to OT intervention. She is having difficulty remaining awake. RN notified who reports pt has been like this all day and she wonders if pt was awake at night. Pt reporting pain in B shoulders with AROM. AAROM for B UEs in all planes of movement. Pt washing face with set up A and cuing to initiate. Pt rolling L <> R this session with +2 assistance for bed mobility for repositioning. Lunch tray has been placed in front of her with set up of tray and she is able to pick up sandwich and feed herself several bites. Call bell and all needed items within reach.       If plan is discharge home, recommend the following:  Two people to help with walking and/or transfers;A lot of help with bathing/dressing/bathroom;Assistance with cooking/housework;Direct supervision/assist for medications management;Direct supervision/assist for financial management;Help with stairs or ramp for entrance;Assist for transportation;Supervision due to cognitive status   Equipment Recommendations  Other (comment) (defer)       Precautions / Restrictions Precautions Precautions: Fall Restrictions Weight Bearing Restrictions: No       Mobility Bed Mobility Overal bed mobility: Needs Assistance Bed Mobility: Rolling Rolling: Max assist, +2 for physical assistance              Transfers                             ADL either performed or assessed with clinical judgement   ADL Overall ADL's : Needs assistance/impaired Eating/Feeding: Set up;Bed level                                           Extremity/Trunk Assessment Upper Extremity Assessment Upper Extremity Assessment: Generalized weakness   Lower Extremity Assessment Lower Extremity Assessment: Generalized weakness        Vision Patient Visual Report: No change from baseline            Cognition Arousal: Alert Behavior During Therapy: WFL for tasks assessed/performed Overall Cognitive Status: History of cognitive impairments - at baseline                                 General Comments: POA present stating pt has hx of dementia.                   Pertinent Vitals/ Pain       Pain Assessment Pain Assessment: Faces Faces Pain Scale: Hurts a little bit Pain Location: general Pain Descriptors / Indicators: Discomfort, Grimacing Pain Intervention(s): Limited activity within patient's tolerance, Monitored during session, Repositioned         Frequency  Min 1X/week        Progress Toward Goals  OT Goals(current goals can now be found in the care plan section)  Progress towards OT goals: Progressing toward goals      AM-PAC OT "6 Clicks" Daily Activity  Outcome Measure   Help from another person eating meals?: A Little Help from another person taking care of personal grooming?: A Little Help from another person toileting, which includes using toliet, bedpan, or urinal?: Total Help from another person bathing (including washing, rinsing, drying)?: A Lot Help from another person to put on and taking off regular upper body clothing?: A Lot Help from another person to put on and taking off regular lower body clothing?: Total 6 Click Score: 12    End of Session    OT Visit Diagnosis: Unsteadiness on feet (R26.81);Muscle weakness (generalized) (M62.81)   Activity Tolerance Patient tolerated treatment well;Patient limited by lethargy   Patient Left in bed;with call bell/phone within reach;with bed alarm set   Nurse Communication Mobility status         Time: 2130-8657 OT Time Calculation (min): 23 min  Charges: OT General Charges $OT Visit: 1 Visit OT Treatments $Self Care/Home Management : 8-22 mins $Therapeutic Activity: 8-22 mins  Jackquline Denmark, MS, OTR/L , CBIS ascom [phone]  05/14/23, 4:49 PM

## 2023-05-14 NOTE — Care Management Important Message (Signed)
Important Message  Patient Details  Name: Margaret Francis MRN: [id number] Date of Birth: 10-01-1929   Medicare Important Message Given:  Yes     Olegario Messier A Katie Moch 05/14/2023, 12:21 PM

## 2023-05-14 NOTE — Plan of Care (Signed)
  Problem: Fluid Volume: Goal: Hemodynamic stability will improve Outcome: Progressing   Problem: Clinical Measurements: Goal: Diagnostic test results will improve Outcome: Progressing Goal: Signs and symptoms of infection will decrease Outcome: Progressing   Problem: Respiratory: Goal: Ability to maintain adequate ventilation will improve Outcome: Progressing   Problem: Health Behavior/Discharge Planning: Goal: Ability to manage health-related needs will improve Outcome: Progressing   Problem: Clinical Measurements: Goal: Ability to maintain clinical measurements within normal limits will improve Outcome: Progressing Goal: Will remain free from infection Outcome: Progressing Goal: Diagnostic test results will improve Outcome: Progressing Goal: Respiratory complications will improve Outcome: Progressing Goal: Cardiovascular complication will be avoided Outcome: Progressing   Problem: Activity: Goal: Risk for activity intolerance will decrease Outcome: Progressing   Problem: Nutrition: Goal: Adequate nutrition will be maintained Outcome: Progressing   Problem: Coping: Goal: Level of anxiety will decrease Outcome: Progressing   Problem: Elimination: Goal: Will not experience complications related to bowel motility Outcome: Progressing Goal: Will not experience complications related to urinary retention Outcome: Progressing   Problem: Pain Managment: Goal: General experience of comfort will improve Outcome: Progressing   Problem: Safety: Goal: Ability to remain free from injury will improve Outcome: Progressing   Problem: Skin Integrity: Goal: Risk for impaired skin integrity will decrease Outcome: Progressing

## 2023-05-14 NOTE — Progress Notes (Signed)
PROGRESS NOTE    Margaret Francis  VVO:[id number] DOB: [birth year] DOA: 05/08/2023 PCP: Smiley Houseman, NP    Assessment & Plan:   Principal Problem:   Sepsis [facility]) Active Problems:   Abnormality of gait   Weakness   Hypertension   History of stroke   Bilateral foot-drop   Peripheral neuropathy   Vulvar pruritus   Hypothyroidism   Vulvovaginal candidiasis   Pyuria  Assessment and Plan: Sepsis: met criteria w/ tachycardia, leukocytosis & possible UTI. Urine cx shows insignificant growth. Completed abx course. Blood cxs NGTD. Sepsis resolved  Weakness: OT/PT recs SNF. CT head shows no acute intracranial abnormalities.   Hypokalemia: WNL today   HTN: continue on hydrochlorothiazide, losartan   Vulvovaginal candidiasis: continue on home clobetastol ointment    Hypothyroidism: continue on levothyroxine   Hyponatremia: labile. Will continue to monitor   Thrombocytopenia: resolved   Constipation: colace, miralax & lactulose   DVT prophylaxis: lovenox  Code Status: DNR Family Communication:  Disposition Plan: likely d/c to SNF.    Level of care: Telemetry Medical Status is: Inpatient Remains inpatient appropriate because: Medically stable but bed not available today as per CM     Consultants:    Procedures:   Antimicrobials:    Subjective: Pt c/o fatigue   Objective: Vitals:   05/13/23 1852 05/14/23 0000 05/14/23 0819 05/14/23 1221  BP: 120/79 (!) 110/58 123/69   Pulse:  72 71   Resp:  17 16   Temp:  98 F (36.7 C) 97.6 F (36.4 C)   TempSrc:      SpO2:  94% 98%   Weight:    95.3 kg    Intake/Output Summary (Last 24 hours) at 05/14/2023 1328 Last data filed at 05/14/2023 0900 Gross per 24 hour  Intake 890.33 ml  Output 850 ml  Net 40.33 ml   Filed Weights   05/14/23 1221  Weight: 95.3 kg    Examination:  General exam: appears comfortable  Respiratory system: clear breath sounds b/l Cardiovascular system: S1/S2+. No rubs or clicks   Gastrointestinal system: abd is soft, NT, obese & hypoactive bowel sounds Central nervous system: alert & awake. Moves all extremities  Psychiatry: judgement and insight appears poor. Flat mood and affect    Data Reviewed: I have personally reviewed following labs and imaging studies  CBC: Recent Labs  Lab 05/10/23 0200 05/11/23 0501 05/12/23 0353 05/13/23 0339 05/14/23 0616  WBC 8.3 9.0 9.2 10.0 7.8  HGB 12.3 12.3 12.6 12.1 12.4  HCT 35.6* 35.6* 37.8 35.5* 36.9  MCV 91.5 89.9 90.2 91.3 91.3  PLT 149* 160 205 215 233   Basic Metabolic Panel: Recent Labs  Lab 05/10/23 0200 05/11/23 0501 05/12/23 0353 05/13/23 0339 05/14/23 0616  NA 133* 133* 132* 132* 133*  K 3.5 3.1* 4.1 3.9 4.0  CL 102 102 102 102 101  CO2 21* 25 21* 23 23  GLUCOSE 120* 131* 145* 160* 134*  BUN 14 14 14 16 15   CREATININE 0.73 0.64 0.66 0.67 0.65  CALCIUM 10.0 10.2 10.6* 11.4* 11.2*  MG  --   --  2.0 2.1 2.0   GFR: Estimated Creatinine Clearance: 54.3 mL/min (by C-G formula based on SCr of 0.65 mg/dL). Liver Function Tests: Recent Labs  Lab 05/08/23 1525  AST 29  ALT 31  ALKPHOS 138*  BILITOT 1.2  PROT 7.1  ALBUMIN 3.8   No results for input(s): "LIPASE", "AMYLASE" in the last 168 hours. No results for input(s): "AMMONIA" in the last  168 hours. Coagulation Profile: Recent Labs  Lab 05/08/23 2007 05/09/23 0512  INR 1.1 1.1   Cardiac Enzymes: No results for input(s): "CKTOTAL", "CKMB", "CKMBINDEX", "TROPONINI" in the last 168 hours. BNP (last 3 results) No results for input(s): "PROBNP" in the last 8760 hours. HbA1C: No results for input(s): "HGBA1C" in the last 72 hours. CBG: No results for input(s): "GLUCAP" in the last 168 hours. Lipid Profile: No results for input(s): "CHOL", "HDL", "LDLCALC", "TRIG", "CHOLHDL", "LDLDIRECT" in the last 72 hours. Thyroid Function Tests: No results for input(s): "TSH", "T4TOTAL", "FREET4", "T3FREE", "THYROIDAB" in the last 72 hours. Anemia  Panel: No results for input(s): "VITAMINB12", "FOLATE", "FERRITIN", "TIBC", "IRON", "RETICCTPCT" in the last 72 hours. Sepsis Labs: Recent Labs  Lab 05/08/23 1521 05/08/23 2007  PROCALCITON  --  0.17  LATICACIDVEN 1.8 1.8    Recent Results (from the past 240 hour(s))  Blood culture (routine x 2)     Status: None   Collection Time: 05/08/23  3:25 PM   Specimen: BLOOD  Result Value Ref Range Status   Specimen Description BLOOD BLOOD RIGHT HAND  Final   Special Requests   Final    BOTTLES DRAWN AEROBIC AND ANAEROBIC Blood Culture results may not be optimal due to an excessive volume of blood received in culture bottles   Culture   Final    NO GROWTH 5 DAYS Performed at [facility], [address], [place], [place] [address]    Report Status 05/13/2023 FINAL  Final  Urine Culture     Status: Abnormal   Collection Time: 05/08/23  5:22 PM   Specimen: Urine, Clean Catch  Result Value Ref Range Status   Specimen Description   Final    URINE, CLEAN CATCH Performed at [facility], [address], [place], [place] [address]    Special Requests   Final    NONE Performed at [facility], [address], [place], [place] [address]    Culture (A)  Final    <10,000 COLONIES/mL INSIGNIFICANT GROWTH Performed at [facility], [address], [place], [place] [address]    Report Status 05/10/2023 FINAL  Final  Resp panel by RT-PCR (RSV, Flu A&B, Covid) Anterior Nasal Swab     Status: None   Collection Time: 05/08/23  6:00 PM   Specimen: Anterior Nasal Swab  Result Value Ref Range Status   SARS Coronavirus 2 by RT PCR NEGATIVE NEGATIVE Final    Comment: (NOTE) SARS-CoV-2 target nucleic acids are NOT DETECTED.  The SARS-CoV-2 RNA is generally detectable in upper respiratory specimens during the acute phase of infection. The lowest concentration of SARS-CoV-2 viral copies this assay can detect is 138 copies/mL. A negative result does not preclude  SARS-Cov-2 infection and should not be used as the sole basis for treatment or other patient management decisions. A negative result may occur with  improper specimen collection/handling, submission of specimen other than nasopharyngeal swab, presence of viral mutation(s) within the areas targeted by this assay, and inadequate number of viral copies(<138 copies/mL). A negative result must be combined with clinical observations, patient history, and epidemiological information. The expected result is Negative.  Fact Sheet for Patients:  BloggerCourse.com  Fact Sheet for Healthcare Providers:  SeriousBroker.it  This test is no t yet approved or cleared by the [place] FDA and  has been authorized for detection and/or diagnosis of SARS-CoV-2 by FDA under an Emergency Use Authorization (EUA). This EUA will remain  in effect (meaning  this test can be used) for the duration of the COVID-19 declaration under Section 564(b)(1) of the Act, 21 U.S.C.section 360bbb-3(b)(1), unless the authorization is terminated  or revoked sooner.       Influenza A by PCR NEGATIVE NEGATIVE Final   Influenza B by PCR NEGATIVE NEGATIVE Final    Comment: (NOTE) The Xpert Xpress SARS-CoV-2/FLU/RSV plus assay is intended as an aid in the diagnosis of influenza from Nasopharyngeal swab specimens and should not be used as a sole basis for treatment. Nasal washings and aspirates are unacceptable for Xpert Xpress SARS-CoV-2/FLU/RSV testing.  Fact Sheet for Patients: BloggerCourse.com  Fact Sheet for Healthcare Providers: SeriousBroker.it  This test is not yet approved or cleared by the [place] FDA and has been authorized for detection and/or diagnosis of SARS-CoV-2 by FDA under an Emergency Use Authorization (EUA). This EUA will remain in effect (meaning this test can be used) for the duration of  the COVID-19 declaration under Section 564(b)(1) of the Act, 21 U.S.C. section 360bbb-3(b)(1), unless the authorization is terminated or revoked.     Resp Syncytial Virus by PCR NEGATIVE NEGATIVE Final    Comment: (NOTE) Fact Sheet for Patients: BloggerCourse.com  Fact Sheet for Healthcare Providers: SeriousBroker.it  This test is not yet approved or cleared by the [place] FDA and has been authorized for detection and/or diagnosis of SARS-CoV-2 by FDA under an Emergency Use Authorization (EUA). This EUA will remain in effect (meaning this test can be used) for the duration of the COVID-19 declaration under Section 564(b)(1) of the Act, 21 U.S.C. section 360bbb-3(b)(1), unless the authorization is terminated or revoked.  Performed at [facility], [address] [place], [place] [address]   Culture, blood (Routine X 2) w Reflex to ID Panel     Status: None   Collection Time: 05/09/23  1:05 PM   Specimen: BLOOD  Result Value Ref Range Status   Specimen Description BLOOD BLOOD RIGHT HAND  Final   Special Requests   Final    BOTTLES DRAWN AEROBIC AND ANAEROBIC Blood Culture results may not be optimal due to an excessive volume of blood received in culture bottles   Culture   Final    NO GROWTH 5 DAYS Performed at [facility], [address], [place], [place] [address]    Report Status 05/14/2023 FINAL  Final         Radiology Studies: DG Humerus Left  Result Date: 05/13/2023 CLINICAL DATA:  Left upper arm pain. EXAM: LEFT HUMERUS - 2+ VIEW COMPARISON:  None Available. FINDINGS: Mild diffuse decreased bone mineralization. Mild degenerate change of the [facility] joint. No acute fracture or dislocation. IMPRESSION: No acute findings. Electronically Signed   By: Elberta Fortis M.D.   On: 05/13/2023 14:53        Scheduled Meds:  acetaminophen  1,000 mg Oral NOW   aspirin EC  81 mg Oral Daily    cholecalciferol  2,000 Units Oral Daily   clobetasol ointment   Topical QHS   docusate sodium  200 mg Oral BID   enoxaparin (LOVENOX) injection  40 mg Subcutaneous Q24H   hydrochlorothiazide  12.5 mg Oral Daily   lactulose  30 g Oral BID   levothyroxine  50 mcg Oral Q0600   losartan  100 mg Oral Daily   mirabegron ER  50 mg Oral Daily   mirtazapine  7.5 mg Oral Daily   pantoprazole  20 mg Oral Daily   sertraline  50 mg Oral Daily  Continuous Infusions:  cefTRIAXone (ROCEPHIN)  IV 2 g (05/13/23 0950)     LOS: 6 days      Charise Killian, MD [facility] Pager [phone]  If 7PM-7AM, please contact night-coverage [email or web address] 05/14/2023, 1:28 PM

## 2023-05-15 DIAGNOSIS — R531 Weakness: Secondary | ICD-10-CM | POA: Diagnosis not present

## 2023-05-15 DIAGNOSIS — N3281 Overactive bladder: Secondary | ICD-10-CM | POA: Diagnosis not present

## 2023-05-15 DIAGNOSIS — Z743 Need for continuous supervision: Secondary | ICD-10-CM | POA: Diagnosis not present

## 2023-05-15 DIAGNOSIS — K5900 Constipation, unspecified: Secondary | ICD-10-CM | POA: Diagnosis not present

## 2023-05-15 DIAGNOSIS — K219 Gastro-esophageal reflux disease without esophagitis: Secondary | ICD-10-CM | POA: Diagnosis not present

## 2023-05-15 DIAGNOSIS — M21371 Foot drop, right foot: Secondary | ICD-10-CM | POA: Diagnosis not present

## 2023-05-15 DIAGNOSIS — Z8673 Personal history of transient ischemic attack (TIA), and cerebral infarction without residual deficits: Secondary | ICD-10-CM | POA: Diagnosis not present

## 2023-05-15 DIAGNOSIS — E871 Hypo-osmolality and hyponatremia: Secondary | ICD-10-CM | POA: Diagnosis not present

## 2023-05-15 DIAGNOSIS — E039 Hypothyroidism, unspecified: Secondary | ICD-10-CM | POA: Diagnosis not present

## 2023-05-15 DIAGNOSIS — Z7982 Long term (current) use of aspirin: Secondary | ICD-10-CM | POA: Diagnosis not present

## 2023-05-15 DIAGNOSIS — M6281 Muscle weakness (generalized): Secondary | ICD-10-CM | POA: Diagnosis not present

## 2023-05-15 DIAGNOSIS — N390 Urinary tract infection, site not specified: Secondary | ICD-10-CM | POA: Diagnosis not present

## 2023-05-15 DIAGNOSIS — E876 Hypokalemia: Secondary | ICD-10-CM | POA: Diagnosis not present

## 2023-05-15 DIAGNOSIS — I10 Essential (primary) hypertension: Secondary | ICD-10-CM | POA: Diagnosis not present

## 2023-05-15 DIAGNOSIS — E559 Vitamin D deficiency, unspecified: Secondary | ICD-10-CM | POA: Diagnosis not present

## 2023-05-15 DIAGNOSIS — M21372 Foot drop, left foot: Secondary | ICD-10-CM | POA: Diagnosis not present

## 2023-05-15 DIAGNOSIS — D696 Thrombocytopenia, unspecified: Secondary | ICD-10-CM | POA: Diagnosis not present

## 2023-05-15 DIAGNOSIS — A419 Sepsis, unspecified organism: Secondary | ICD-10-CM | POA: Diagnosis not present

## 2023-05-15 DIAGNOSIS — G629 Polyneuropathy, unspecified: Secondary | ICD-10-CM | POA: Diagnosis not present

## 2023-05-15 DIAGNOSIS — R6889 Other general symptoms and signs: Secondary | ICD-10-CM | POA: Diagnosis not present

## 2023-05-15 LAB — MAGNESIUM: Magnesium: 2.1 mg/dL (ref 1.7–2.4)

## 2023-05-15 MED ORDER — ORAL CARE MOUTH RINSE: 15.0000 mL | OROMUCOSAL | Status: DC | PRN

## 2023-05-15 MED ORDER — LACTULOSE ENEMA
300.0000 mL | ORAL | Status: DC
  Filled 2023-05-15: qty 300

## 2023-05-15 NOTE — Discharge Summary (Signed)
Physician Discharge Summary  Margaret Francis:[id number] DOB: May 04, 1930 DOA: 05/08/2023  PCP: Margaret Houseman, NP  Admit date: 05/08/2023 Discharge date: 05/15/2023  Admitted From: home  Disposition:  SNF  Recommendations for Outpatient Follow-up:  Follow up with PCP in 1-2 weeks   Home Health: no  Equipment/Devices:  Discharge Condition: stable  CODE STATUS: DNR Diet recommendation: Heart Healthy   Brief/Interim Summary: HPI was taken from Dr. Sedalia Francis:  Ms. Margaret Francis is a 89 year old female with history of hypertension, hypothyroid, GERD, depression, who presents to the emergency department for chief concerns of fever and worsening weakness.   Patient is from [facility] facility.   Per ED report the fever at [facility] was reported to be 100.0.  When EMS arrived, patient's temperature was noted to be 99.7.  Unclear if medication interventions were given at [facility].   Vitals in the ED showed temperature of 98.4, respiration rate of 16, heart rate of 102, blood pressure 129/83, SpO2 of 92% on room air.   Serum sodium is 132, potassium 4.2, chloride 97, bicarb 24, BUN of 16, serum creatinine 0.82, EGFR greater than 60, nonfasting blood glucose 176, WBC 12.4, hemoglobin 14.6, platelets of 202.   Lactic acid is 1.8, on repeat was 1.8.   UA was positive for trace leukocytes.   COVID/influenza A/influenza B/RSV PCR were negative.   ED treatment: Cefepime 2 g IV, Flagyl 500 mg IV, vancomycin, LR 1 L bolus  Discharge Diagnoses:  Principal Problem:   Sepsis (HCC) Active Problems:   Abnormality of gait   Weakness   Hypertension   History of stroke   Bilateral foot-drop   Peripheral neuropathy   Vulvar pruritus   Hypothyroidism   Vulvovaginal candidiasis   Pyuria Sepsis: met criteria w/ tachycardia, leukocytosis & possible UTI. Urine cx shows insignificant growth. Completed abx course. Blood cxs NGTD. Sepsis resolved   Weakness: OT/PT recs SNF. CT head shows no acute  intracranial abnormalities.    Hypokalemia: WNL today    HTN: continue on hydrochlorothiazide, losartan    Vulvovaginal candidiasis: continue on home clobetastol ointment    Hypothyroidism: continue on levothyroxine    Hyponatremia: labile. Will continue to monitor    Thrombocytopenia: resolved    Constipation: resolved.    Discharge Instructions  Discharge Instructions     Diet - low sodium heart healthy   Complete by: As directed    Discharge instructions   Complete by: As directed    F/u w/ PCP in 1-2 weeks   Increase activity slowly   Complete by: As directed       Allergies as of 05/15/2023       Reactions   Demerol Nausea And Vomiting   Morphine And Codeine Nausea And Vomiting        Medication List     TAKE these medications    aspirin 81 MG tablet Take 81 mg by mouth daily.   Calcium Carbonate Antacid 400 MG Chew Chew 400 mg by mouth every 8 (eight) hours as needed.   clobetasol ointment 0.05 % Commonly known as: TEMOVATE Spread the labia/lips as wide as possible and apply half applicator full tube of medicine to the opening of the vagina qhs x 2 month, then three times  weekly until seen in the office.   hydrochlorothiazide 12.5 MG tablet Commonly known as: HYDRODIURIL Take 12.5 mg by mouth daily.   levothyroxine 50 MCG tablet Commonly known as: SYNTHROID Take 50 mcg by mouth daily.   losartan 100 MG tablet  Commonly known as: COZAAR Take 100 mg by mouth daily.   mirabegron ER 50 MG Tb24 tablet Commonly known as: Myrbetriq Take 1 tablet (50 mg total) by mouth daily.   mirtazapine 7.5 MG tablet Commonly known as: REMERON Take 7.5 mg by mouth at bedtime.   pantoprazole 20 MG tablet Commonly known as: PROTONIX Take 20 mg by mouth daily.   polyethylene glycol 17 g packet Commonly known as: MIRALAX / GLYCOLAX Take 17 g by mouth at bedtime. Given at bedtime on Mon, Wed and Fri   psyllium 0.52 g capsule Commonly known as:  REGULOID Take 0.52 g by mouth 2 (two) times daily.   sertraline 50 MG tablet Commonly known as: ZOLOFT 1 tablet   Vitamin D3 50 MCG (2000 UT) capsule Take 2,000 Units by mouth daily.   Zinc Oxide 15 % Crea Apply 1 application  topically 3 (three) times daily.        Contact information for after-discharge care     Destination     [facility] Preferred SNF .   Service: Skilled Nursing Contact information: [address] [place] [address] [phone]                    Allergies  Allergen Reactions   Demerol Nausea And Vomiting   Morphine And Codeine Nausea And Vomiting    Consultations:    Procedures/Studies: DG Humerus Left  Result Date: 05/13/2023 CLINICAL DATA:  Left upper arm pain. EXAM: LEFT HUMERUS - 2+ VIEW COMPARISON:  None Available. FINDINGS: Mild diffuse decreased bone mineralization. Mild degenerate change of the [facility] joint. No acute fracture or dislocation. IMPRESSION: No acute findings. Electronically Signed   By: Elberta Fortis M.D.   On: 05/13/2023 14:53   CT HEAD WO CONTRAST ( )  Result Date: 05/08/2023 CLINICAL DATA:  89 year old female with history of CVA presents for worsening weakness and possible sepsis EXAM: CT HEAD WITHOUT CONTRAST TECHNIQUE: Contiguous axial images were obtained from the base of the skull through the vertex without intravenous contrast. RADIATION DOSE REDUCTION: This exam was performed according to the departmental dose-optimization program which includes automated exposure control, adjustment of the mA and/or kV according to patient size and/or use of iterative reconstruction technique. COMPARISON:  CT head 04/29/2022 FINDINGS: Brain: No intracranial hemorrhage, mass effect, or evidence of acute infarct. No hydrocephalus. No extra-axial fluid collection. Age-commensurate cerebral atrophy and chronic small vessel ischemic  disease. Similar ventriculomegaly Vascular: No hyperdense vessel. Intracranial arterial calcification. Skull: No fracture or focal lesion. Chronic left nasal bone fracture. Sinuses/Orbits: No acute finding. Other: None. IMPRESSION: 1. No acute intracranial abnormality. Electronically Signed   By: Minerva Fester M.D.   On: 05/08/2023 23:28   DG Chest Portable 1 View  Result Date: 05/08/2023 CLINICAL DATA:  Fever, weakness. EXAM: PORTABLE CHEST 1 VIEW COMPARISON:  Jan 02, 2022. FINDINGS: The heart size and mediastinal contours are within normal limits. Both lungs are clear. The visualized skeletal structures are unremarkable. IMPRESSION: No active disease. Electronically Signed   By: Lupita Raider M.D.   On: 05/08/2023 18:41   (Echo, Carotid, EGD, Colonoscopy, ERCP)    Subjective: Pt c/o fatigue    Discharge Exam: Vitals:   05/15/23 0745 05/15/23 1051  BP: (!) 120/58 (!) 131/93  Pulse: 63 73  Resp: 17   Temp: (!) 97.5 F (36.4 C)   SpO2: 96%    Vitals:   05/14/23 1544 05/14/23  2340 05/15/23 0745 05/15/23 1051  BP: 119/61 (!) 152/73 (!) 120/58 (!) 131/93  Pulse: 71 67 63 73  Resp: 16 16 17    Temp: 97.6 F (36.4 C) 98.3 F (36.8 C) (!) 97.5 F (36.4 C)   TempSrc:      SpO2: 93% 97% 96%   Weight:        General: Pt is alert, awake, not in acute distress Cardiovascular:  S1/S2 +, no rubs, no gallops Respiratory: CTA bilaterally, no wheezing, no rhonchi Abdominal: Soft, NT,obese, bowel sounds + Extremities:  no cyanosis    The results of significant diagnostics from this hospitalization (including imaging, microbiology, ancillary and laboratory) are listed below for reference.     Microbiology: Recent Results (from the past 240 hour(s))  Blood culture (routine x 2)     Status: None   Collection Time: 05/08/23  3:25 PM   Specimen: BLOOD  Result Value Ref Range Status   Specimen Description BLOOD BLOOD RIGHT HAND  Final   Special Requests   Final    BOTTLES DRAWN  AEROBIC AND ANAEROBIC Blood Culture results may not be optimal due to an excessive volume of blood received in culture bottles   Culture   Final    NO GROWTH 5 DAYS Performed at [facility], [address] [place], [place] [address]    Report Status 05/13/2023 FINAL  Final  Urine Culture     Status: Abnormal   Collection Time: 05/08/23  5:22 PM   Specimen: Urine, Clean Catch  Result Value Ref Range Status   Specimen Description   Final    URINE, CLEAN CATCH Performed at [facility], [address], [place], [place] [address]    Special Requests   Final    NONE Performed at [facility], [address], [place], [place] [address]    Culture (A)  Final    <10,000 COLONIES/mL INSIGNIFICANT GROWTH Performed at [facility], [address] [place], [place] [address]    Report Status 05/10/2023 FINAL  Final  Resp panel by RT-PCR (RSV, Flu A&B, Covid) Anterior Nasal Swab     Status: None   Collection Time: 05/08/23  6:00 PM   Specimen: Anterior Nasal Swab  Result Value Ref Range Status   SARS Coronavirus 2 by RT PCR NEGATIVE NEGATIVE Final    Comment: (NOTE) SARS-CoV-2 target nucleic acids are NOT DETECTED.  The SARS-CoV-2 RNA is generally detectable in upper respiratory specimens during the acute phase of infection. The lowest concentration of SARS-CoV-2 viral copies this assay can detect is 138 copies/mL. A negative result does not preclude SARS-Cov-2 infection and should not be used as the sole basis for treatment or other patient management decisions. A negative result may occur with  improper specimen collection/handling, submission of specimen other than nasopharyngeal swab, presence of viral mutation(s) within the areas targeted by this assay, and inadequate number of viral copies(<138 copies/mL). A negative result must be combined with clinical observations, patient history, and epidemiological information. The expected result is  Negative.  Fact Sheet for Patients:  BloggerCourse.com  Fact Sheet for Healthcare Providers:  SeriousBroker.it  This test is no t yet approved or cleared by the [place] FDA and  has been authorized for detection and/or diagnosis of SARS-CoV-2 by FDA under an Emergency Use Authorization (EUA). This EUA will remain  in effect (meaning this test can be used) for the duration of the COVID-19 declaration under Section 564(b)(1) of the Act, 21  U.S.C.section 360bbb-3(b)(1), unless the authorization is terminated  or revoked sooner.       Influenza A by PCR NEGATIVE NEGATIVE Final   Influenza B by PCR NEGATIVE NEGATIVE Final    Comment: (NOTE) The Xpert Xpress SARS-CoV-2/FLU/RSV plus assay is intended as an aid in the diagnosis of influenza from Nasopharyngeal swab specimens and should not be used as a sole basis for treatment. Nasal washings and aspirates are unacceptable for Xpert Xpress SARS-CoV-2/FLU/RSV testing.  Fact Sheet for Patients: BloggerCourse.com  Fact Sheet for Healthcare Providers: SeriousBroker.it  This test is not yet approved or cleared by the [place] FDA and has been authorized for detection and/or diagnosis of SARS-CoV-2 by FDA under an Emergency Use Authorization (EUA). This EUA will remain in effect (meaning this test can be used) for the duration of the COVID-19 declaration under Section 564(b)(1) of the Act, 21 U.S.C. section 360bbb-3(b)(1), unless the authorization is terminated or revoked.     Resp Syncytial Virus by PCR NEGATIVE NEGATIVE Final    Comment: (NOTE) Fact Sheet for Patients: BloggerCourse.com  Fact Sheet for Healthcare Providers: SeriousBroker.it  This test is not yet approved or cleared by the [place] FDA and has been authorized for detection and/or diagnosis of  SARS-CoV-2 by FDA under an Emergency Use Authorization (EUA). This EUA will remain in effect (meaning this test can be used) for the duration of the COVID-19 declaration under Section 564(b)(1) of the Act, 21 U.S.C. section 360bbb-3(b)(1), unless the authorization is terminated or revoked.  Performed at [facility], [address] [place], [place] [address]   Culture, blood (Routine X 2) w Reflex to ID Panel     Status: None   Collection Time: 05/09/23  1:05 PM   Specimen: BLOOD  Result Value Ref Range Status   Specimen Description BLOOD BLOOD RIGHT HAND  Final   Special Requests   Final    BOTTLES DRAWN AEROBIC AND ANAEROBIC Blood Culture results may not be optimal due to an excessive volume of blood received in culture bottles   Culture   Final    NO GROWTH 5 DAYS Performed at [facility], [address], [place], [place] [address]    Report Status 05/14/2023 FINAL  Final     Labs: BNP (last 3 results) No results for input(s): "BNP" in the last 8760 hours. Basic Metabolic Panel: Recent Labs  Lab 05/10/23 0200 05/11/23 0501 05/12/23 0353 05/13/23 0339 05/14/23 0616 05/15/23 0547  NA 133* 133* 132* 132* 133*  --   K 3.5 3.1* 4.1 3.9 4.0  --   CL 102 102 102 102 101  --   CO2 21* 25 21* 23 23  --   GLUCOSE 120* 131* 145* 160* 134*  --   BUN 14 14 14 16 15   --   CREATININE 0.73 0.64 0.66 0.67 0.65  --   CALCIUM 10.0 10.2 10.6* 11.4* 11.2*  --   MG  --   --  2.0 2.1 2.0 2.1   Liver Function Tests: Recent Labs  Lab 05/08/23 1525  AST 29  ALT 31  ALKPHOS 138*  BILITOT 1.2  PROT 7.1  ALBUMIN 3.8   No results for input(s): "LIPASE", "AMYLASE" in the last 168 hours. No results for input(s): "AMMONIA" in the last 168 hours. CBC: Recent Labs  Lab 05/10/23 0200 05/11/23 0501 05/12/23 0353 05/13/23 0339 05/14/23 0616  WBC 8.3 9.0 9.2 10.0 7.8  HGB 12.3 12.3 12.6 12.1 12.4  HCT 35.6* 35.6*  37.8 35.5* 36.9  MCV 91.5 89.9 90.2 91.3 91.3   PLT 149* 160 205 215 233   Cardiac Enzymes: No results for input(s): "CKTOTAL", "CKMB", "CKMBINDEX", "TROPONINI" in the last 168 hours. BNP: Invalid input(s): "POCBNP" CBG: No results for input(s): "GLUCAP" in the last 168 hours. D-Dimer No results for input(s): "DDIMER" in the last 72 hours. Hgb A1c No results for input(s): "HGBA1C" in the last 72 hours. Lipid Profile No results for input(s): "CHOL", "HDL", "LDLCALC", "TRIG", "CHOLHDL", "LDLDIRECT" in the last 72 hours. Thyroid function studies No results for input(s): "TSH", "T4TOTAL", "T3FREE", "THYROIDAB" in the last 72 hours.  Invalid input(s): "FREET3" Anemia work up No results for input(s): "VITAMINB12", "FOLATE", "FERRITIN", "TIBC", "IRON", "RETICCTPCT" in the last 72 hours. Urinalysis    Component Value Date/Time   COLORURINE YELLOW (A) 05/08/2023 1722   APPEARANCEUR CLEAR (A) 05/08/2023 1722   LABSPEC 1.021 05/08/2023 1722   PHURINE 7.0 05/08/2023 1722   GLUCOSEU NEGATIVE 05/08/2023 1722   HGBUR NEGATIVE 05/08/2023 1722   BILIRUBINUR NEGATIVE 05/08/2023 1722   BILIRUBINUR neg 04/19/2011 1221   KETONESUR NEGATIVE 05/08/2023 1722   PROTEINUR NEGATIVE 05/08/2023 1722   UROBILINOGEN neg 04/19/2011 1221   NITRITE NEGATIVE 05/08/2023 1722   LEUKOCYTESUR TRACE (A) 05/08/2023 1722   Sepsis Labs Recent Labs  Lab 05/11/23 0501 05/12/23 0353 05/13/23 0339 05/14/23 0616  WBC 9.0 9.2 10.0 7.8   Microbiology Recent Results (from the past 240 hour(s))  Blood culture (routine x 2)     Status: None   Collection Time: 05/08/23  3:25 PM   Specimen: BLOOD  Result Value Ref Range Status   Specimen Description BLOOD BLOOD RIGHT HAND  Final   Special Requests   Final    BOTTLES DRAWN AEROBIC AND ANAEROBIC Blood Culture results may not be optimal due to an excessive volume of blood received in culture bottles   Culture   Final    NO GROWTH 5 DAYS Performed at [facility], [address], [place], [place]  [address]    Report Status 05/13/2023 FINAL  Final  Urine Culture     Status: Abnormal   Collection Time: 05/08/23  5:22 PM   Specimen: Urine, Clean Catch  Result Value Ref Range Status   Specimen Description   Final    URINE, CLEAN CATCH Performed at [facility], [address], [place], [place] [address]    Special Requests   Final    NONE Performed at [facility], [address] [place], [place] [address]    Culture (A)  Final    <10,000 COLONIES/mL INSIGNIFICANT GROWTH Performed at [facility], [address] [place], [place] [address]    Report Status 05/10/2023 FINAL  Final  Resp panel by RT-PCR (RSV, Flu A&B, Covid) Anterior Nasal Swab     Status: None   Collection Time: 05/08/23  6:00 PM   Specimen: Anterior Nasal Swab  Result Value Ref Range Status   SARS Coronavirus 2 by RT PCR NEGATIVE NEGATIVE Final    Comment: (NOTE) SARS-CoV-2 target nucleic acids are NOT DETECTED.  The SARS-CoV-2 RNA is generally detectable in upper respiratory specimens during the acute phase of infection. The lowest concentration of SARS-CoV-2 viral copies this assay can detect is 138 copies/mL. A negative result does not preclude SARS-Cov-2 infection and should not be used as the sole basis for treatment or other patient management decisions. A negative result may occur with  improper specimen collection/handling, submission of specimen other  than nasopharyngeal swab, presence of viral mutation(s) within the areas targeted by this assay, and inadequate number of viral copies(<138 copies/mL). A negative result must be combined with clinical observations, patient history, and epidemiological information. The expected result is Negative.  Fact Sheet for Patients:  BloggerCourse.com  Fact Sheet for Healthcare Providers:  SeriousBroker.it  This test is no t yet approved or cleared by the [place] FDA and  has  been authorized for detection and/or diagnosis of SARS-CoV-2 by FDA under an Emergency Use Authorization (EUA). This EUA will remain  in effect (meaning this test can be used) for the duration of the COVID-19 declaration under Section 564(b)(1) of the Act, 21 U.S.C.section 360bbb-3(b)(1), unless the authorization is terminated  or revoked sooner.       Influenza A by PCR NEGATIVE NEGATIVE Final   Influenza B by PCR NEGATIVE NEGATIVE Final    Comment: (NOTE) The Xpert Xpress SARS-CoV-2/FLU/RSV plus assay is intended as an aid in the diagnosis of influenza from Nasopharyngeal swab specimens and should not be used as a sole basis for treatment. Nasal washings and aspirates are unacceptable for Xpert Xpress SARS-CoV-2/FLU/RSV testing.  Fact Sheet for Patients: BloggerCourse.com  Fact Sheet for Healthcare Providers: SeriousBroker.it  This test is not yet approved or cleared by the [place] FDA and has been authorized for detection and/or diagnosis of SARS-CoV-2 by FDA under an Emergency Use Authorization (EUA). This EUA will remain in effect (meaning this test can be used) for the duration of the COVID-19 declaration under Section 564(b)(1) of the Act, 21 U.S.C. section 360bbb-3(b)(1), unless the authorization is terminated or revoked.     Resp Syncytial Virus by PCR NEGATIVE NEGATIVE Final    Comment: (NOTE) Fact Sheet for Patients: BloggerCourse.com  Fact Sheet for Healthcare Providers: SeriousBroker.it  This test is not yet approved or cleared by the [place] FDA and has been authorized for detection and/or diagnosis of SARS-CoV-2 by FDA under an Emergency Use Authorization (EUA). This EUA will remain in effect (meaning this test can be used) for the duration of the COVID-19 declaration under Section 564(b)(1) of the Act, 21 U.S.C. section 360bbb-3(b)(1), unless the  authorization is terminated or revoked.  Performed at [facility], [address] [place], [place] [address]   Culture, blood (Routine X 2) w Reflex to ID Panel     Status: None   Collection Time: 05/09/23  1:05 PM   Specimen: BLOOD  Result Value Ref Range Status   Specimen Description BLOOD BLOOD RIGHT HAND  Final   Special Requests   Final    BOTTLES DRAWN AEROBIC AND ANAEROBIC Blood Culture results may not be optimal due to an excessive volume of blood received in culture bottles   Culture   Final    NO GROWTH 5 DAYS Performed at [facility], [address], [place], [place] [address]    Report Status 05/14/2023 FINAL  Final     Time coordinating discharge: Over 30 minutes  SIGNED:   Charise Killian, MD  [facility] 05/15/2023, 12:15 PM Pager   If 7PM-7AM, please contact night-coverage [email or web address]

## 2023-05-15 NOTE — Plan of Care (Signed)
  Problem: Fluid Volume: Goal: Hemodynamic stability will improve Outcome: Progressing   Problem: Clinical Measurements: Goal: Diagnostic test results will improve Outcome: Progressing Goal: Signs and symptoms of infection will decrease Outcome: Progressing   Problem: Respiratory: Goal: Ability to maintain adequate ventilation will improve Outcome: Progressing   Problem: Health Behavior/Discharge Planning: Goal: Ability to manage health-related needs will improve Outcome: Progressing   Problem: Clinical Measurements: Goal: Ability to maintain clinical measurements within normal limits will improve Outcome: Progressing Goal: Will remain free from infection Outcome: Progressing Goal: Diagnostic test results will improve Outcome: Progressing Goal: Respiratory complications will improve Outcome: Progressing Goal: Cardiovascular complication will be avoided Outcome: Progressing   Problem: Activity: Goal: Risk for activity intolerance will decrease Outcome: Progressing   Problem: Nutrition: Goal: Adequate nutrition will be maintained Outcome: Progressing   Problem: Coping: Goal: Level of anxiety will decrease Outcome: Progressing   Problem: Elimination: Goal: Will not experience complications related to bowel motility Outcome: Progressing Goal: Will not experience complications related to urinary retention Outcome: Progressing   Problem: Pain Managment: Goal: General experience of comfort will improve Outcome: Progressing   Problem: Safety: Goal: Ability to remain free from injury will improve Outcome: Progressing   Problem: Skin Integrity: Goal: Risk for impaired skin integrity will decrease Outcome: Progressing

## 2023-05-15 NOTE — Progress Notes (Signed)
Physical Therapy Treatment Patient Details Name: Margaret Francis MRN: [id number] DOB: [birth year] Today's Date: 05/15/2023   History of Present Illness Ms. Margaret Francis is a 89 year old female with history of hypertension, hypothyroid, GERD, depression, who presents to the emergency department for chief concerns of fever and worsening weakness    PT Comments  Pt with c/o head and upper back discomfort earlier. Nursing gave pt pain meds and pt agreed to try to mobilize with PT. Upon sitting EOB with MaxA, pt continued to c/o posterior neck and upper back discomfort. Provided soft tissue massage with some relief, however pt felt she was too weak to attempt further mobility. Pt repositioned back in bed for B LE ROM with assist. Pt positioned to comfort, all needs met. Will continue to see pt for acute PT until possible transition to [facility].   If plan is discharge home, recommend the following: A lot of help with walking and/or transfers;A lot of help with bathing/dressing/bathroom;Help with stairs or ramp for entrance;Assist for transportation;Direct supervision/assist for financial management;Assistance with cooking/housework;Direct supervision/assist for medications management   Can travel by private vehicle     No  Equipment Recommendations  Other (comment) (TBD at next level of care)    Recommendations for Other Services       Precautions / Restrictions Precautions Precautions: Fall Restrictions Weight Bearing Restrictions: No     Mobility  Bed Mobility Overal bed mobility: Needs Assistance Bed Mobility: Rolling Rolling: Max assist, Used rails   Supine to sit: Max assist, HOB elevated Sit to supine: Max assist   General bed mobility comments: limited by weakness and ongoing pain    Transfers                   General transfer comment: Pt unable to tolerate this date struggling with sitting EOB    Ambulation/Gait               General Gait Details:  Has not ambulated for ~1 year   Stairs             Wheelchair Mobility     Tilt Bed    Modified Rankin (Stroke Patients Only)       Balance Overall balance assessment: Needs assistance Sitting-balance support: Bilateral upper extremity supported, Feet supported Sitting balance-Leahy Scale: Poor Sitting balance - Comments: able to sit at edge of bed 5+ minutes with min A Postural control: Right lateral lean Standing balance support: Bilateral upper extremity supported, During functional activity, Reliant on assistive device for balance Standing balance-Leahy Scale: Poor                              Cognition Arousal: Alert Behavior During Therapy: WFL for tasks assessed/performed Overall Cognitive Status: History of cognitive impairments - at baseline                                 General Comments: POA present stating pt has hx of dementia.        Exercises General Exercises - Lower Extremity Ankle Circles/Pumps: AROM, AAROM, Both, 10 reps, Supine Heel Slides: AAROM, Both, 10 reps, Supine Hip ABduction/ADduction: AAROM, Both, 10 reps, Supine    General Comments        Pertinent Vitals/Pain Pain Assessment Pain Assessment: Faces Faces Pain Scale: Hurts even more Pain Location: neck and upper back Pain Descriptors / Indicators:  Discomfort, Grimacing Pain Intervention(s): Limited activity within patient's tolerance, Premedicated before session    Home Living                          Prior Function            PT Goals (current goals can now be found in the care plan section) Acute Rehab PT Goals Patient Stated Goal: none stated    Frequency    Min 1X/week      PT Plan      Co-evaluation              AM-PAC PT "6 Clicks" Mobility   Outcome Measure  Help needed turning from your back to your side while in a flat bed without using bedrails?: A Lot Help needed moving from lying on your back to  sitting on the side of a flat bed without using bedrails?: A Lot Help needed moving to and from a bed to a chair (including a wheelchair)?: Total Help needed standing up from a chair using your arms (e.g., wheelchair or bedside chair)?: A Lot Help needed to walk in hospital room?: Total Help needed climbing 3-5 steps with a railing? : Total 6 Click Score: 9    End of Session   Activity Tolerance: Patient limited by fatigue;Patient limited by lethargy Patient left: in bed;with call bell/phone within reach;with bed alarm set;with family/visitor present Nurse Communication: Mobility status;Other (comment) PT Visit Diagnosis: Other abnormalities of gait and mobility (R26.89);Muscle weakness (generalized) (M62.81);Unsteadiness on feet (R26.81)     Time: 1104-1130 PT Time Calculation (min) (ACUTE ONLY): 26 min  Charges:    $Therapeutic Exercise: 8-22 mins $Therapeutic Activity: 8-22 mins PT General Charges $$ ACUTE PT VISIT: 1 Visit                    Zadie Cleverly, PTA  Jannet Askew 05/15/2023, 12:25 PM

## 2023-05-15 NOTE — H&P (Signed)
Report given to [facility] RN @ [facility].

## 2023-05-15 NOTE — Consult Note (Signed)
[facility] Customer service manager [facility]) Accountable Care Organization (ACO) [facility] Liaison Note  05/15/2023  Margaret Francis Mar 14, 1930 [id number]  Location: [facility] RN Hospital Liaison screened the patient remotely at [facility].  Insurance: [employer]   Margaret Francis is a 89 y.o. female who is a Primary Care Patient of Smiley Houseman, NP [facility]). The patient was screened for readmission hospitalization with noted low risk score for unplanned readmission risk with 1 IP in 6 months.  The patient was assessed for potential [facility]) Care Management service needs for post hospital transition for care coordination. Review of patient's electronic medical record reveals patient was admitted for Weakness. Pt has a non-affiliated  provider for community care services with [facility] at this time.    [facility] Care Management/Population Health does not replace or interfere with any arrangements made by the Inpatient Transition of Care team.   For questions contact:   Elliot Cousin, RN, [facility] Liaison [place]   Population Health Office Hours MTWF  8:00 am-6:00 pm [phone] mobile [phone] [Office toll free line] Office Hours are M-F 8:30 - 5 pm [email or web address]

## 2023-05-15 NOTE — TOC Progression Note (Signed)
Transition of Care [facility]) - Progression Note    Patient Details  Name: Margaret Francis MRN: [id number] Date of Birth: [birth year]  Transition of Care [facility]) CM/SW Contact  Marlowe Sax, RN Phone Number: 05/15/2023, 12:50 PM  Clinical Narrative:     The patient is going to room 609 At [facility] called she is 1st on list Family in the room and is aware  Expected Discharge Plan: Skilled Nursing Facility Barriers to Discharge: SNF Pending bed offer, Insurance Authorization  Expected Discharge Plan and Services         Expected Discharge Date: 05/15/23                                     Social Determinants of Health (SDOH) Interventions SDOH Screenings   Tobacco Use: Low Risk  (05/08/2023)    Readmission Risk Interventions     No data to display

## 2023-05-18 ENCOUNTER — Other Ambulatory Visit: Payer: Self-pay | Admitting: *Deleted

## 2023-05-18 DIAGNOSIS — E039 Hypothyroidism, unspecified: Secondary | ICD-10-CM | POA: Diagnosis not present

## 2023-05-18 DIAGNOSIS — I10 Essential (primary) hypertension: Secondary | ICD-10-CM | POA: Diagnosis not present

## 2023-05-18 DIAGNOSIS — M6281 Muscle weakness (generalized): Secondary | ICD-10-CM | POA: Diagnosis not present

## 2023-05-18 DIAGNOSIS — K5900 Constipation, unspecified: Secondary | ICD-10-CM | POA: Diagnosis not present

## 2023-05-18 DIAGNOSIS — D696 Thrombocytopenia, unspecified: Secondary | ICD-10-CM | POA: Diagnosis not present

## 2023-05-18 DIAGNOSIS — A419 Sepsis, unspecified organism: Secondary | ICD-10-CM | POA: Diagnosis not present

## 2023-05-18 DIAGNOSIS — E876 Hypokalemia: Secondary | ICD-10-CM | POA: Diagnosis not present

## 2023-05-18 DIAGNOSIS — E871 Hypo-osmolality and hyponatremia: Secondary | ICD-10-CM | POA: Diagnosis not present

## 2023-05-18 NOTE — Patient Outreach (Signed)
Mrs. Mullinax resides in [facility].   Screening for potential chronic care management needs as benefit of health plan and PCP.   Secure communication sent to SNF social worker to inquire about transition plans/needs.   Raiford Noble, MSN, RN, BSN [place]  [facility] RN Post- Acute Care Coordinator Direct Dial: [phone]

## 2023-05-20 DIAGNOSIS — D696 Thrombocytopenia, unspecified: Secondary | ICD-10-CM | POA: Diagnosis not present

## 2023-05-21 DIAGNOSIS — A419 Sepsis, unspecified organism: Secondary | ICD-10-CM | POA: Diagnosis not present

## 2023-05-21 DIAGNOSIS — N390 Urinary tract infection, site not specified: Secondary | ICD-10-CM | POA: Diagnosis not present

## 2023-05-21 DIAGNOSIS — E039 Hypothyroidism, unspecified: Secondary | ICD-10-CM | POA: Diagnosis not present

## 2023-05-21 DIAGNOSIS — K5900 Constipation, unspecified: Secondary | ICD-10-CM | POA: Diagnosis not present

## 2023-05-21 DIAGNOSIS — I10 Essential (primary) hypertension: Secondary | ICD-10-CM | POA: Diagnosis not present

## 2023-05-21 DIAGNOSIS — M6281 Muscle weakness (generalized): Secondary | ICD-10-CM | POA: Diagnosis not present

## 2023-05-22 DIAGNOSIS — K5900 Constipation, unspecified: Secondary | ICD-10-CM | POA: Diagnosis not present

## 2023-05-22 DIAGNOSIS — I10 Essential (primary) hypertension: Secondary | ICD-10-CM | POA: Diagnosis not present

## 2023-05-22 DIAGNOSIS — A419 Sepsis, unspecified organism: Secondary | ICD-10-CM | POA: Diagnosis not present

## 2023-05-22 DIAGNOSIS — D696 Thrombocytopenia, unspecified: Secondary | ICD-10-CM | POA: Diagnosis not present

## 2023-05-22 DIAGNOSIS — E039 Hypothyroidism, unspecified: Secondary | ICD-10-CM | POA: Diagnosis not present

## 2023-05-22 DIAGNOSIS — M6281 Muscle weakness (generalized): Secondary | ICD-10-CM | POA: Diagnosis not present

## 2023-05-22 DIAGNOSIS — E876 Hypokalemia: Secondary | ICD-10-CM | POA: Diagnosis not present

## 2023-05-22 DIAGNOSIS — E871 Hypo-osmolality and hyponatremia: Secondary | ICD-10-CM | POA: Diagnosis not present

## 2023-05-23 DIAGNOSIS — A419 Sepsis, unspecified organism: Secondary | ICD-10-CM | POA: Diagnosis not present

## 2023-05-23 DIAGNOSIS — I10 Essential (primary) hypertension: Secondary | ICD-10-CM | POA: Diagnosis not present

## 2023-05-23 DIAGNOSIS — M6281 Muscle weakness (generalized): Secondary | ICD-10-CM | POA: Diagnosis not present

## 2023-05-23 DIAGNOSIS — K5900 Constipation, unspecified: Secondary | ICD-10-CM | POA: Diagnosis not present

## 2023-05-25 DIAGNOSIS — M6281 Muscle weakness (generalized): Secondary | ICD-10-CM | POA: Diagnosis not present

## 2023-05-25 DIAGNOSIS — K219 Gastro-esophageal reflux disease without esophagitis: Secondary | ICD-10-CM | POA: Diagnosis not present

## 2023-05-25 DIAGNOSIS — N390 Urinary tract infection, site not specified: Secondary | ICD-10-CM | POA: Diagnosis not present

## 2023-05-25 DIAGNOSIS — I10 Essential (primary) hypertension: Secondary | ICD-10-CM | POA: Diagnosis not present

## 2023-05-25 DIAGNOSIS — K5900 Constipation, unspecified: Secondary | ICD-10-CM | POA: Diagnosis not present

## 2023-05-25 DIAGNOSIS — E039 Hypothyroidism, unspecified: Secondary | ICD-10-CM | POA: Diagnosis not present

## 2023-05-25 DIAGNOSIS — A419 Sepsis, unspecified organism: Secondary | ICD-10-CM | POA: Diagnosis not present

## 2023-05-28 DIAGNOSIS — N390 Urinary tract infection, site not specified: Secondary | ICD-10-CM | POA: Diagnosis not present

## 2023-05-28 DIAGNOSIS — A419 Sepsis, unspecified organism: Secondary | ICD-10-CM | POA: Diagnosis not present

## 2023-05-28 DIAGNOSIS — D696 Thrombocytopenia, unspecified: Secondary | ICD-10-CM | POA: Diagnosis not present

## 2023-05-28 DIAGNOSIS — M6281 Muscle weakness (generalized): Secondary | ICD-10-CM | POA: Diagnosis not present

## 2023-05-28 DIAGNOSIS — I10 Essential (primary) hypertension: Secondary | ICD-10-CM | POA: Diagnosis not present

## 2023-05-30 ENCOUNTER — Telehealth: Payer: Self-pay

## 2023-05-30 DIAGNOSIS — K5900 Constipation, unspecified: Secondary | ICD-10-CM | POA: Diagnosis not present

## 2023-05-30 DIAGNOSIS — M6281 Muscle weakness (generalized): Secondary | ICD-10-CM | POA: Diagnosis not present

## 2023-05-30 DIAGNOSIS — N390 Urinary tract infection, site not specified: Secondary | ICD-10-CM | POA: Diagnosis not present

## 2023-05-30 DIAGNOSIS — A419 Sepsis, unspecified organism: Secondary | ICD-10-CM | POA: Diagnosis not present

## 2023-05-30 NOTE — Telephone Encounter (Signed)
Pt care giver called in stating pt was septic and ended up in the nursing home. She will return to [facility] on Friday. She will call once she knows pts condition to schedule follow up appt.

## 2023-06-06 DIAGNOSIS — M6281 Muscle weakness (generalized): Secondary | ICD-10-CM | POA: Diagnosis not present

## 2023-06-06 DIAGNOSIS — E876 Hypokalemia: Secondary | ICD-10-CM | POA: Diagnosis not present

## 2023-06-06 DIAGNOSIS — I10 Essential (primary) hypertension: Secondary | ICD-10-CM | POA: Diagnosis not present

## 2023-06-06 DIAGNOSIS — N390 Urinary tract infection, site not specified: Secondary | ICD-10-CM | POA: Diagnosis not present

## 2023-06-06 DIAGNOSIS — E871 Hypo-osmolality and hyponatremia: Secondary | ICD-10-CM | POA: Diagnosis not present

## 2023-06-06 DIAGNOSIS — E039 Hypothyroidism, unspecified: Secondary | ICD-10-CM | POA: Diagnosis not present

## 2023-06-06 DIAGNOSIS — K5900 Constipation, unspecified: Secondary | ICD-10-CM | POA: Diagnosis not present

## 2023-06-06 DIAGNOSIS — A419 Sepsis, unspecified organism: Secondary | ICD-10-CM | POA: Diagnosis not present

## 2023-06-06 DIAGNOSIS — D696 Thrombocytopenia, unspecified: Secondary | ICD-10-CM | POA: Diagnosis not present

## 2023-06-09 DIAGNOSIS — K219 Gastro-esophageal reflux disease without esophagitis: Secondary | ICD-10-CM | POA: Diagnosis not present

## 2023-06-09 DIAGNOSIS — K5900 Constipation, unspecified: Secondary | ICD-10-CM | POA: Diagnosis not present

## 2023-06-09 DIAGNOSIS — I69354 Hemiplegia and hemiparesis following cerebral infarction affecting left non-dominant side: Secondary | ICD-10-CM | POA: Diagnosis not present

## 2023-06-09 DIAGNOSIS — M21371 Foot drop, right foot: Secondary | ICD-10-CM | POA: Diagnosis not present

## 2023-06-09 DIAGNOSIS — E039 Hypothyroidism, unspecified: Secondary | ICD-10-CM | POA: Diagnosis not present

## 2023-06-09 DIAGNOSIS — I10 Essential (primary) hypertension: Secondary | ICD-10-CM | POA: Diagnosis not present

## 2023-06-09 DIAGNOSIS — E876 Hypokalemia: Secondary | ICD-10-CM | POA: Diagnosis not present

## 2023-06-09 DIAGNOSIS — M21372 Foot drop, left foot: Secondary | ICD-10-CM | POA: Diagnosis not present

## 2023-06-09 DIAGNOSIS — G629 Polyneuropathy, unspecified: Secondary | ICD-10-CM | POA: Diagnosis not present

## 2023-06-09 DIAGNOSIS — Z87440 Personal history of urinary (tract) infections: Secondary | ICD-10-CM | POA: Diagnosis not present

## 2023-06-09 DIAGNOSIS — M6281 Muscle weakness (generalized): Secondary | ICD-10-CM | POA: Diagnosis not present

## 2023-06-09 DIAGNOSIS — Z9181 History of falling: Secondary | ICD-10-CM | POA: Diagnosis not present

## 2023-06-13 DIAGNOSIS — M6281 Muscle weakness (generalized): Secondary | ICD-10-CM | POA: Diagnosis not present

## 2023-06-13 DIAGNOSIS — E871 Hypo-osmolality and hyponatremia: Secondary | ICD-10-CM | POA: Diagnosis not present

## 2023-06-13 DIAGNOSIS — I129 Hypertensive chronic kidney disease with stage 1 through stage 4 chronic kidney disease, or unspecified chronic kidney disease: Secondary | ICD-10-CM | POA: Diagnosis not present

## 2023-06-13 DIAGNOSIS — U071 COVID-19: Secondary | ICD-10-CM | POA: Diagnosis not present

## 2023-06-13 DIAGNOSIS — N390 Urinary tract infection, site not specified: Secondary | ICD-10-CM | POA: Diagnosis not present

## 2023-06-13 DIAGNOSIS — N1831 Chronic kidney disease, stage 3a: Secondary | ICD-10-CM | POA: Diagnosis not present

## 2023-06-13 DIAGNOSIS — D696 Thrombocytopenia, unspecified: Secondary | ICD-10-CM | POA: Diagnosis not present

## 2023-06-13 DIAGNOSIS — I679 Cerebrovascular disease, unspecified: Secondary | ICD-10-CM | POA: Diagnosis not present

## 2023-06-13 DIAGNOSIS — E876 Hypokalemia: Secondary | ICD-10-CM | POA: Diagnosis not present

## 2023-06-15 DIAGNOSIS — D696 Thrombocytopenia, unspecified: Secondary | ICD-10-CM | POA: Diagnosis not present

## 2023-06-15 DIAGNOSIS — I639 Cerebral infarction, unspecified: Secondary | ICD-10-CM | POA: Diagnosis not present

## 2023-06-16 DIAGNOSIS — E876 Hypokalemia: Secondary | ICD-10-CM | POA: Diagnosis not present

## 2023-06-16 DIAGNOSIS — E039 Hypothyroidism, unspecified: Secondary | ICD-10-CM | POA: Diagnosis not present

## 2023-06-16 DIAGNOSIS — K219 Gastro-esophageal reflux disease without esophagitis: Secondary | ICD-10-CM | POA: Diagnosis not present

## 2023-06-16 DIAGNOSIS — Z9181 History of falling: Secondary | ICD-10-CM | POA: Diagnosis not present

## 2023-06-16 DIAGNOSIS — M21371 Foot drop, right foot: Secondary | ICD-10-CM | POA: Diagnosis not present

## 2023-06-16 DIAGNOSIS — I10 Essential (primary) hypertension: Secondary | ICD-10-CM | POA: Diagnosis not present

## 2023-06-16 DIAGNOSIS — G629 Polyneuropathy, unspecified: Secondary | ICD-10-CM | POA: Diagnosis not present

## 2023-06-16 DIAGNOSIS — M6281 Muscle weakness (generalized): Secondary | ICD-10-CM | POA: Diagnosis not present

## 2023-06-16 DIAGNOSIS — I69354 Hemiplegia and hemiparesis following cerebral infarction affecting left non-dominant side: Secondary | ICD-10-CM | POA: Diagnosis not present

## 2023-06-16 DIAGNOSIS — Z87440 Personal history of urinary (tract) infections: Secondary | ICD-10-CM | POA: Diagnosis not present

## 2023-06-16 DIAGNOSIS — M21372 Foot drop, left foot: Secondary | ICD-10-CM | POA: Diagnosis not present

## 2023-06-16 DIAGNOSIS — K5900 Constipation, unspecified: Secondary | ICD-10-CM | POA: Diagnosis not present

## 2023-06-18 DIAGNOSIS — I69354 Hemiplegia and hemiparesis following cerebral infarction affecting left non-dominant side: Secondary | ICD-10-CM | POA: Diagnosis not present

## 2023-06-18 DIAGNOSIS — I10 Essential (primary) hypertension: Secondary | ICD-10-CM | POA: Diagnosis not present

## 2023-06-18 DIAGNOSIS — M6281 Muscle weakness (generalized): Secondary | ICD-10-CM | POA: Diagnosis not present

## 2023-06-18 DIAGNOSIS — E039 Hypothyroidism, unspecified: Secondary | ICD-10-CM | POA: Diagnosis not present

## 2023-06-18 DIAGNOSIS — M21372 Foot drop, left foot: Secondary | ICD-10-CM | POA: Diagnosis not present

## 2023-06-18 DIAGNOSIS — E876 Hypokalemia: Secondary | ICD-10-CM | POA: Diagnosis not present

## 2023-06-18 DIAGNOSIS — Z9181 History of falling: Secondary | ICD-10-CM | POA: Diagnosis not present

## 2023-06-18 DIAGNOSIS — Z87440 Personal history of urinary (tract) infections: Secondary | ICD-10-CM | POA: Diagnosis not present

## 2023-06-18 DIAGNOSIS — K219 Gastro-esophageal reflux disease without esophagitis: Secondary | ICD-10-CM | POA: Diagnosis not present

## 2023-06-18 DIAGNOSIS — K5900 Constipation, unspecified: Secondary | ICD-10-CM | POA: Diagnosis not present

## 2023-06-18 DIAGNOSIS — G629 Polyneuropathy, unspecified: Secondary | ICD-10-CM | POA: Diagnosis not present

## 2023-06-18 DIAGNOSIS — M21371 Foot drop, right foot: Secondary | ICD-10-CM | POA: Diagnosis not present

## 2023-06-25 DIAGNOSIS — E876 Hypokalemia: Secondary | ICD-10-CM | POA: Diagnosis not present

## 2023-06-25 DIAGNOSIS — M21371 Foot drop, right foot: Secondary | ICD-10-CM | POA: Diagnosis not present

## 2023-06-25 DIAGNOSIS — I10 Essential (primary) hypertension: Secondary | ICD-10-CM | POA: Diagnosis not present

## 2023-06-25 DIAGNOSIS — Z87440 Personal history of urinary (tract) infections: Secondary | ICD-10-CM | POA: Diagnosis not present

## 2023-06-25 DIAGNOSIS — K219 Gastro-esophageal reflux disease without esophagitis: Secondary | ICD-10-CM | POA: Diagnosis not present

## 2023-06-25 DIAGNOSIS — G629 Polyneuropathy, unspecified: Secondary | ICD-10-CM | POA: Diagnosis not present

## 2023-06-25 DIAGNOSIS — K5900 Constipation, unspecified: Secondary | ICD-10-CM | POA: Diagnosis not present

## 2023-06-25 DIAGNOSIS — E039 Hypothyroidism, unspecified: Secondary | ICD-10-CM | POA: Diagnosis not present

## 2023-06-25 DIAGNOSIS — Z9181 History of falling: Secondary | ICD-10-CM | POA: Diagnosis not present

## 2023-06-25 DIAGNOSIS — I69354 Hemiplegia and hemiparesis following cerebral infarction affecting left non-dominant side: Secondary | ICD-10-CM | POA: Diagnosis not present

## 2023-06-25 DIAGNOSIS — M6281 Muscle weakness (generalized): Secondary | ICD-10-CM | POA: Diagnosis not present

## 2023-06-25 DIAGNOSIS — M21372 Foot drop, left foot: Secondary | ICD-10-CM | POA: Diagnosis not present

## 2023-06-26 DIAGNOSIS — E039 Hypothyroidism, unspecified: Secondary | ICD-10-CM | POA: Diagnosis not present

## 2023-06-26 DIAGNOSIS — M6281 Muscle weakness (generalized): Secondary | ICD-10-CM | POA: Diagnosis not present

## 2023-06-26 DIAGNOSIS — I10 Essential (primary) hypertension: Secondary | ICD-10-CM | POA: Diagnosis not present

## 2023-06-26 DIAGNOSIS — K219 Gastro-esophageal reflux disease without esophagitis: Secondary | ICD-10-CM | POA: Diagnosis not present

## 2023-06-26 DIAGNOSIS — Z9181 History of falling: Secondary | ICD-10-CM | POA: Diagnosis not present

## 2023-06-26 DIAGNOSIS — M21371 Foot drop, right foot: Secondary | ICD-10-CM | POA: Diagnosis not present

## 2023-06-26 DIAGNOSIS — E876 Hypokalemia: Secondary | ICD-10-CM | POA: Diagnosis not present

## 2023-06-26 DIAGNOSIS — Z87440 Personal history of urinary (tract) infections: Secondary | ICD-10-CM | POA: Diagnosis not present

## 2023-06-26 DIAGNOSIS — K5900 Constipation, unspecified: Secondary | ICD-10-CM | POA: Diagnosis not present

## 2023-06-26 DIAGNOSIS — I69354 Hemiplegia and hemiparesis following cerebral infarction affecting left non-dominant side: Secondary | ICD-10-CM | POA: Diagnosis not present

## 2023-06-26 DIAGNOSIS — G629 Polyneuropathy, unspecified: Secondary | ICD-10-CM | POA: Diagnosis not present

## 2023-06-26 DIAGNOSIS — M21372 Foot drop, left foot: Secondary | ICD-10-CM | POA: Diagnosis not present

## 2023-06-27 DIAGNOSIS — G629 Polyneuropathy, unspecified: Secondary | ICD-10-CM | POA: Diagnosis not present

## 2023-06-27 DIAGNOSIS — D696 Thrombocytopenia, unspecified: Secondary | ICD-10-CM | POA: Diagnosis not present

## 2023-06-27 DIAGNOSIS — E039 Hypothyroidism, unspecified: Secondary | ICD-10-CM | POA: Diagnosis not present

## 2023-06-27 DIAGNOSIS — K5900 Constipation, unspecified: Secondary | ICD-10-CM | POA: Diagnosis not present

## 2023-06-27 DIAGNOSIS — M21372 Foot drop, left foot: Secondary | ICD-10-CM | POA: Diagnosis not present

## 2023-06-27 DIAGNOSIS — E876 Hypokalemia: Secondary | ICD-10-CM | POA: Diagnosis not present

## 2023-06-27 DIAGNOSIS — M21371 Foot drop, right foot: Secondary | ICD-10-CM | POA: Diagnosis not present

## 2023-06-27 DIAGNOSIS — I10 Essential (primary) hypertension: Secondary | ICD-10-CM | POA: Diagnosis not present

## 2023-06-27 DIAGNOSIS — N1831 Chronic kidney disease, stage 3a: Secondary | ICD-10-CM | POA: Diagnosis not present

## 2023-06-27 DIAGNOSIS — E871 Hypo-osmolality and hyponatremia: Secondary | ICD-10-CM | POA: Diagnosis not present

## 2023-06-27 DIAGNOSIS — I129 Hypertensive chronic kidney disease with stage 1 through stage 4 chronic kidney disease, or unspecified chronic kidney disease: Secondary | ICD-10-CM | POA: Diagnosis not present

## 2023-06-27 DIAGNOSIS — E1140 Type 2 diabetes mellitus with diabetic neuropathy, unspecified: Secondary | ICD-10-CM | POA: Diagnosis not present

## 2023-06-27 DIAGNOSIS — Z87440 Personal history of urinary (tract) infections: Secondary | ICD-10-CM | POA: Diagnosis not present

## 2023-06-27 DIAGNOSIS — K219 Gastro-esophageal reflux disease without esophagitis: Secondary | ICD-10-CM | POA: Diagnosis not present

## 2023-06-27 DIAGNOSIS — Z9181 History of falling: Secondary | ICD-10-CM | POA: Diagnosis not present

## 2023-06-27 DIAGNOSIS — I69354 Hemiplegia and hemiparesis following cerebral infarction affecting left non-dominant side: Secondary | ICD-10-CM | POA: Diagnosis not present

## 2023-06-27 DIAGNOSIS — M6281 Muscle weakness (generalized): Secondary | ICD-10-CM | POA: Diagnosis not present

## 2023-07-03 DIAGNOSIS — Z9181 History of falling: Secondary | ICD-10-CM | POA: Diagnosis not present

## 2023-07-03 DIAGNOSIS — K219 Gastro-esophageal reflux disease without esophagitis: Secondary | ICD-10-CM | POA: Diagnosis not present

## 2023-07-03 DIAGNOSIS — Z87440 Personal history of urinary (tract) infections: Secondary | ICD-10-CM | POA: Diagnosis not present

## 2023-07-03 DIAGNOSIS — I69354 Hemiplegia and hemiparesis following cerebral infarction affecting left non-dominant side: Secondary | ICD-10-CM | POA: Diagnosis not present

## 2023-07-03 DIAGNOSIS — M21371 Foot drop, right foot: Secondary | ICD-10-CM | POA: Diagnosis not present

## 2023-07-03 DIAGNOSIS — K5900 Constipation, unspecified: Secondary | ICD-10-CM | POA: Diagnosis not present

## 2023-07-03 DIAGNOSIS — E876 Hypokalemia: Secondary | ICD-10-CM | POA: Diagnosis not present

## 2023-07-03 DIAGNOSIS — I10 Essential (primary) hypertension: Secondary | ICD-10-CM | POA: Diagnosis not present

## 2023-07-03 DIAGNOSIS — G629 Polyneuropathy, unspecified: Secondary | ICD-10-CM | POA: Diagnosis not present

## 2023-07-03 DIAGNOSIS — M6281 Muscle weakness (generalized): Secondary | ICD-10-CM | POA: Diagnosis not present

## 2023-07-03 DIAGNOSIS — E039 Hypothyroidism, unspecified: Secondary | ICD-10-CM | POA: Diagnosis not present

## 2023-07-03 DIAGNOSIS — M21372 Foot drop, left foot: Secondary | ICD-10-CM | POA: Diagnosis not present

## 2023-07-04 DIAGNOSIS — Z87440 Personal history of urinary (tract) infections: Secondary | ICD-10-CM | POA: Diagnosis not present

## 2023-07-04 DIAGNOSIS — M6281 Muscle weakness (generalized): Secondary | ICD-10-CM | POA: Diagnosis not present

## 2023-07-04 DIAGNOSIS — K5900 Constipation, unspecified: Secondary | ICD-10-CM | POA: Diagnosis not present

## 2023-07-04 DIAGNOSIS — Z9181 History of falling: Secondary | ICD-10-CM | POA: Diagnosis not present

## 2023-07-04 DIAGNOSIS — K219 Gastro-esophageal reflux disease without esophagitis: Secondary | ICD-10-CM | POA: Diagnosis not present

## 2023-07-04 DIAGNOSIS — M542 Cervicalgia: Secondary | ICD-10-CM | POA: Diagnosis not present

## 2023-07-04 DIAGNOSIS — G629 Polyneuropathy, unspecified: Secondary | ICD-10-CM | POA: Diagnosis not present

## 2023-07-04 DIAGNOSIS — I10 Essential (primary) hypertension: Secondary | ICD-10-CM | POA: Diagnosis not present

## 2023-07-04 DIAGNOSIS — M21371 Foot drop, right foot: Secondary | ICD-10-CM | POA: Diagnosis not present

## 2023-07-04 DIAGNOSIS — E039 Hypothyroidism, unspecified: Secondary | ICD-10-CM | POA: Diagnosis not present

## 2023-07-04 DIAGNOSIS — E876 Hypokalemia: Secondary | ICD-10-CM | POA: Diagnosis not present

## 2023-07-04 DIAGNOSIS — R269 Unspecified abnormalities of gait and mobility: Secondary | ICD-10-CM | POA: Diagnosis not present

## 2023-07-04 DIAGNOSIS — M21372 Foot drop, left foot: Secondary | ICD-10-CM | POA: Diagnosis not present

## 2023-07-04 DIAGNOSIS — I69354 Hemiplegia and hemiparesis following cerebral infarction affecting left non-dominant side: Secondary | ICD-10-CM | POA: Diagnosis not present

## 2023-07-06 DIAGNOSIS — M21372 Foot drop, left foot: Secondary | ICD-10-CM | POA: Diagnosis not present

## 2023-07-06 DIAGNOSIS — Z87440 Personal history of urinary (tract) infections: Secondary | ICD-10-CM | POA: Diagnosis not present

## 2023-07-06 DIAGNOSIS — G629 Polyneuropathy, unspecified: Secondary | ICD-10-CM | POA: Diagnosis not present

## 2023-07-06 DIAGNOSIS — I69354 Hemiplegia and hemiparesis following cerebral infarction affecting left non-dominant side: Secondary | ICD-10-CM | POA: Diagnosis not present

## 2023-07-06 DIAGNOSIS — M21371 Foot drop, right foot: Secondary | ICD-10-CM | POA: Diagnosis not present

## 2023-07-06 DIAGNOSIS — I10 Essential (primary) hypertension: Secondary | ICD-10-CM | POA: Diagnosis not present

## 2023-07-06 DIAGNOSIS — E039 Hypothyroidism, unspecified: Secondary | ICD-10-CM | POA: Diagnosis not present

## 2023-07-06 DIAGNOSIS — K219 Gastro-esophageal reflux disease without esophagitis: Secondary | ICD-10-CM | POA: Diagnosis not present

## 2023-07-06 DIAGNOSIS — K5900 Constipation, unspecified: Secondary | ICD-10-CM | POA: Diagnosis not present

## 2023-07-06 DIAGNOSIS — E876 Hypokalemia: Secondary | ICD-10-CM | POA: Diagnosis not present

## 2023-07-06 DIAGNOSIS — Z9181 History of falling: Secondary | ICD-10-CM | POA: Diagnosis not present

## 2023-07-06 DIAGNOSIS — M6281 Muscle weakness (generalized): Secondary | ICD-10-CM | POA: Diagnosis not present

## 2023-07-07 DIAGNOSIS — Z87440 Personal history of urinary (tract) infections: Secondary | ICD-10-CM | POA: Diagnosis not present

## 2023-07-07 DIAGNOSIS — I10 Essential (primary) hypertension: Secondary | ICD-10-CM | POA: Diagnosis not present

## 2023-07-07 DIAGNOSIS — G629 Polyneuropathy, unspecified: Secondary | ICD-10-CM | POA: Diagnosis not present

## 2023-07-07 DIAGNOSIS — M21372 Foot drop, left foot: Secondary | ICD-10-CM | POA: Diagnosis not present

## 2023-07-07 DIAGNOSIS — E876 Hypokalemia: Secondary | ICD-10-CM | POA: Diagnosis not present

## 2023-07-07 DIAGNOSIS — K5900 Constipation, unspecified: Secondary | ICD-10-CM | POA: Diagnosis not present

## 2023-07-07 DIAGNOSIS — I69354 Hemiplegia and hemiparesis following cerebral infarction affecting left non-dominant side: Secondary | ICD-10-CM | POA: Diagnosis not present

## 2023-07-07 DIAGNOSIS — M21371 Foot drop, right foot: Secondary | ICD-10-CM | POA: Diagnosis not present

## 2023-07-07 DIAGNOSIS — Z9181 History of falling: Secondary | ICD-10-CM | POA: Diagnosis not present

## 2023-07-07 DIAGNOSIS — E039 Hypothyroidism, unspecified: Secondary | ICD-10-CM | POA: Diagnosis not present

## 2023-07-07 DIAGNOSIS — K219 Gastro-esophageal reflux disease without esophagitis: Secondary | ICD-10-CM | POA: Diagnosis not present

## 2023-07-07 DIAGNOSIS — M6281 Muscle weakness (generalized): Secondary | ICD-10-CM | POA: Diagnosis not present

## 2023-07-09 DIAGNOSIS — Z9181 History of falling: Secondary | ICD-10-CM | POA: Diagnosis not present

## 2023-07-09 DIAGNOSIS — K5900 Constipation, unspecified: Secondary | ICD-10-CM | POA: Diagnosis not present

## 2023-07-09 DIAGNOSIS — I10 Essential (primary) hypertension: Secondary | ICD-10-CM | POA: Diagnosis not present

## 2023-07-09 DIAGNOSIS — M6281 Muscle weakness (generalized): Secondary | ICD-10-CM | POA: Diagnosis not present

## 2023-07-09 DIAGNOSIS — I69354 Hemiplegia and hemiparesis following cerebral infarction affecting left non-dominant side: Secondary | ICD-10-CM | POA: Diagnosis not present

## 2023-07-09 DIAGNOSIS — E876 Hypokalemia: Secondary | ICD-10-CM | POA: Diagnosis not present

## 2023-07-09 DIAGNOSIS — Z87440 Personal history of urinary (tract) infections: Secondary | ICD-10-CM | POA: Diagnosis not present

## 2023-07-09 DIAGNOSIS — K219 Gastro-esophageal reflux disease without esophagitis: Secondary | ICD-10-CM | POA: Diagnosis not present

## 2023-07-09 DIAGNOSIS — M21372 Foot drop, left foot: Secondary | ICD-10-CM | POA: Diagnosis not present

## 2023-07-09 DIAGNOSIS — E039 Hypothyroidism, unspecified: Secondary | ICD-10-CM | POA: Diagnosis not present

## 2023-07-09 DIAGNOSIS — G629 Polyneuropathy, unspecified: Secondary | ICD-10-CM | POA: Diagnosis not present

## 2023-07-09 DIAGNOSIS — M21371 Foot drop, right foot: Secondary | ICD-10-CM | POA: Diagnosis not present

## 2023-07-10 ENCOUNTER — Emergency Department: Payer: Medicare Other

## 2023-07-10 ENCOUNTER — Other Ambulatory Visit: Payer: Self-pay

## 2023-07-10 ENCOUNTER — Emergency Department
Admission: EM | Admit: 2023-07-10 | Discharge: 2023-07-10 | Disposition: A | Discharge status: Home / Self Care | Payer: Medicare Other | Attending: Emergency Medicine | Admitting: Emergency Medicine

## 2023-07-10 ENCOUNTER — Encounter: Payer: Self-pay | Admitting: Emergency Medicine

## 2023-07-10 DIAGNOSIS — I69354 Hemiplegia and hemiparesis following cerebral infarction affecting left non-dominant side: Secondary | ICD-10-CM | POA: Diagnosis not present

## 2023-07-10 DIAGNOSIS — I10 Essential (primary) hypertension: Secondary | ICD-10-CM | POA: Diagnosis not present

## 2023-07-10 DIAGNOSIS — S0081XA Abrasion of other part of head, initial encounter: Secondary | ICD-10-CM | POA: Diagnosis not present

## 2023-07-10 DIAGNOSIS — R69 Illness, unspecified: Secondary | ICD-10-CM | POA: Diagnosis not present

## 2023-07-10 DIAGNOSIS — S0031XA Abrasion of nose, initial encounter: Secondary | ICD-10-CM | POA: Diagnosis not present

## 2023-07-10 DIAGNOSIS — G629 Polyneuropathy, unspecified: Secondary | ICD-10-CM | POA: Diagnosis not present

## 2023-07-10 DIAGNOSIS — M8580 Other specified disorders of bone density and structure, unspecified site: Secondary | ICD-10-CM | POA: Diagnosis not present

## 2023-07-10 DIAGNOSIS — S0990XA Unspecified injury of head, initial encounter: Secondary | ICD-10-CM

## 2023-07-10 DIAGNOSIS — M47812 Spondylosis without myelopathy or radiculopathy, cervical region: Secondary | ICD-10-CM | POA: Diagnosis not present

## 2023-07-10 DIAGNOSIS — W01198A Fall on same level from slipping, tripping and stumbling with subsequent striking against other object, initial encounter: Secondary | ICD-10-CM | POA: Insufficient documentation

## 2023-07-10 DIAGNOSIS — E876 Hypokalemia: Secondary | ICD-10-CM | POA: Diagnosis not present

## 2023-07-10 DIAGNOSIS — F0390 Unspecified dementia without behavioral disturbance: Secondary | ICD-10-CM | POA: Insufficient documentation

## 2023-07-10 DIAGNOSIS — Z743 Need for continuous supervision: Secondary | ICD-10-CM | POA: Diagnosis not present

## 2023-07-10 DIAGNOSIS — W19XXXA Unspecified fall, initial encounter: Secondary | ICD-10-CM | POA: Diagnosis not present

## 2023-07-10 DIAGNOSIS — N3000 Acute cystitis without hematuria: Secondary | ICD-10-CM | POA: Diagnosis not present

## 2023-07-10 DIAGNOSIS — M9961 Osseous and subluxation stenosis of intervertebral foramina of cervical region: Secondary | ICD-10-CM | POA: Diagnosis not present

## 2023-07-10 DIAGNOSIS — S199XXA Unspecified injury of neck, initial encounter: Secondary | ICD-10-CM | POA: Diagnosis not present

## 2023-07-10 LAB — URINALYSIS, ROUTINE W REFLEX MICROSCOPIC
Bilirubin Urine: NEGATIVE
Glucose, UA: NEGATIVE mg/dL
Ketones, ur: NEGATIVE mg/dL
Nitrite: POSITIVE — AB
Protein, ur: NEGATIVE mg/dL
Specific Gravity, Urine: 1.015 (ref 1.005–1.030)
WBC, UA: >50 WBC/hpf (ref 0–5)
pH: 5 (ref 5.0–8.0)

## 2023-07-10 MED ORDER — CEPHALEXIN 500 MG PO CAPS
500.0000 mg | ORAL_CAPSULE | Freq: Two times a day (BID) | ORAL | 0 refills | Status: DC
Start: 2023-07-10 — End: 2023-07-18

## 2023-07-10 MED ORDER — CEPHALEXIN 500 MG PO CAPS
500.0000 mg | ORAL_CAPSULE | Freq: Once | ORAL | Status: AC
  Administered 2023-07-10: 500 mg via ORAL
  Filled 2023-07-10: qty 1

## 2023-07-10 NOTE — ED Triage Notes (Signed)
Arrives from [place] via [facility].  Called for fall, 2 hours ago. Initially patient did not want to be seen after fall, family wanted patient to be seen.  Abrasion to left eyebrow, no LOC. No change from baseline.  VS wnl.  Not taking thinners.  Patient is without complaint.

## 2023-07-10 NOTE — ED Notes (Signed)
See triage note  Presents from [place] s/p fall  States she bent down to pick with something   And fell face first from w/c Abrasion to forehead and bridge of nose

## 2023-07-10 NOTE — Discharge Instructions (Signed)
You were seen in the emergency department today after a head injury. Fortunately, your exam and CT scan were overall reassuring. It is still possible that you have a concussion. I have included more information about this in your paperwork. Please follow-up with your primary care doctor within a few days for reevaluation. Return to the ER for any worsening symptoms including worsening headache, confusion, or any other new or concerning symptoms.   Your urinalysis here was concerning for infection.  We have sent a urine culture, but I have included a printed prescription for an antibiotic.  Please take this as directed.

## 2023-07-10 NOTE — ED Triage Notes (Signed)
Patient to ED via [facility] from [place] after a fall. Pt states she had a mechanical fall and hit head. Abrasion to nose and above left eyebrow. Denies blood thinners or LOC. Patient denying any pain.

## 2023-07-10 NOTE — ED Provider Notes (Signed)
[facility] Provider Note    Event Date/Time   First MD Initiated Contact with Patient 07/10/23 1637     (approximate)   History   Fall   HPI  Margaret Francis is a 89 year old female with history of hypertension, CVA, dementia presenting to the emergency department for evaluation after a fall.  Patient resides at [place] [facility].  There, patient reported that she was walking when she tripped and fell and hit her head.  No reported preceding chest pain, shortness of breath, lightheadedness.  Denies LOC.  Not on anticoagulation.  Noted to have an abrasion over her nose and eyebrow.  Currently reporting some headache and neck pain.  Denies pain elsewhere.  Friend is present who reports that patient was recently tested for UTI which they just today learned came back positive, but she has not been started on any treatment.     Physical Exam   Triage Vital Signs: ED Triage Vitals [07/10/23 1423]  Encounter Vitals Group     BP 109/68     Systolic BP Percentile      Diastolic BP Percentile      Pulse Rate 84     Resp 18     Temp 97.7 F (36.5 C)     Temp Source Oral     SpO2 95 %     Weight 155 lb (70.3 kg)     Height 5\' 7"  (1.702 m)     Head Circumference      Peak Flow      Pain Score 0     Pain Loc      Pain Education      Exclude from Growth Chart     Most recent vital signs: Vitals:   07/10/23 1423 07/10/23 1825  BP: 109/68 110/70  Pulse: 84 80  Resp: 18 18  Temp: 97.7 F (36.5 C) 97.9 F (36.6 C)  SpO2: 95% 96%    Nursing notes and vital signs reviewed.  General: Adult female, laying in bed, awake interactive Head: Abrasion over the forehead and nose with some surrounding ecchymosis without significant swelling Neck: Mild generalized tenderness over the posterior neck Chest: Symmetric chest rise, no tenderness to palpation.  Cardiac: Regular rhythm and rate.  Respiratory: Lungs clear to auscultation Abdomen: Soft, nondistended. No  tenderness to palpation.  Pelvis: Stable in AP and lateral compression. No tenderness to palpation. MSK: No deformity to bilateral upper and lower extremity. Full range of motion to bilateral upper lower extremity with no pain. Neuro: Alert, oriented. 5 out of 5 strength in bilateral upper and lower extremities. Normal sensation to light touch in bilateral upper and lower extremity. Skin: No evidence of burns or lacerations.  ED Results / Procedures / Treatments   Labs (all labs ordered are listed, but only abnormal results are displayed) Labs Reviewed  URINALYSIS, ROUTINE W REFLEX MICROSCOPIC - Abnormal; Notable for the following components:      Result Value   Color, Urine YELLOW (*)    APPearance HAZY (*)    Hgb urine dipstick MODERATE (*)    Nitrite POSITIVE (*)    Leukocytes,Ua SMALL (*)    Bacteria, UA FEW (*)    All other components within normal limits  URINE CULTURE     EKG EKG independently reviewed interpreted by myself (ER attending) demonstrates:    RADIOLOGY Imaging independently reviewed and interpreted by myself demonstrates:  CT head without acute bleed CT C-spine without acute fracture  PROCEDURES:  Critical  Care performed: No  Procedures   MEDICATIONS ORDERED IN ED: Medications  cephALEXin (KEFLEX) capsule 500 mg (has no administration in time range)     IMPRESSION / MDM / ASSESSMENT AND PLAN / ED COURSE  I reviewed the triage vital signs and the nursing notes.  Differential diagnosis includes, but is not limited to, intracranial bleed, skull fracture, spine fracture, no evidence of thoracoabdominal trauma, no clinical history of syncope or presyncopal symptoms, UTI  Patient's presentation is most consistent with acute presentation with potential threat to life or bodily function.  89 year old female presenting to the emergency department for evaluation after fall.  Vital signs stable on presentation.  CT imaging fortunately without acute  traumatic injury.  Reported recent urinary symptoms and positive outpatient testing.  Urinalysis here is concerning for infection.  Culture sent.  Will initiate on empiric Keflex.  No evidence of sepsis.  Do think patient is stable for discharge back to her facility.  Will DC with prescription for Keflex.  Strict return precautions provided.  Patient discharged in stable condition.     FINAL CLINICAL IMPRESSION(S) / ED DIAGNOSES   Final diagnoses:  Closed head injury, initial encounter  Acute cystitis without hematuria     Rx / DC Orders   ED Discharge Orders          Ordered    cephALEXin (KEFLEX) 500 MG capsule  2 times daily        07/10/23 1940             Note:  This document was prepared using Dragon voice recognition software and may include unintentional dictation errors.   [name], MD 07/10/23 [phone]

## 2023-07-11 DIAGNOSIS — W19XXXA Unspecified fall, initial encounter: Secondary | ICD-10-CM | POA: Diagnosis not present

## 2023-07-11 DIAGNOSIS — R269 Unspecified abnormalities of gait and mobility: Secondary | ICD-10-CM | POA: Diagnosis not present

## 2023-07-11 DIAGNOSIS — N3000 Acute cystitis without hematuria: Secondary | ICD-10-CM | POA: Diagnosis not present

## 2023-07-13 DIAGNOSIS — I10 Essential (primary) hypertension: Secondary | ICD-10-CM | POA: Diagnosis not present

## 2023-07-13 DIAGNOSIS — G629 Polyneuropathy, unspecified: Secondary | ICD-10-CM | POA: Diagnosis not present

## 2023-07-13 DIAGNOSIS — K219 Gastro-esophageal reflux disease without esophagitis: Secondary | ICD-10-CM | POA: Diagnosis not present

## 2023-07-13 DIAGNOSIS — M21372 Foot drop, left foot: Secondary | ICD-10-CM | POA: Diagnosis not present

## 2023-07-13 DIAGNOSIS — W19XXXA Unspecified fall, initial encounter: Secondary | ICD-10-CM | POA: Diagnosis not present

## 2023-07-13 DIAGNOSIS — Z87440 Personal history of urinary (tract) infections: Secondary | ICD-10-CM | POA: Diagnosis not present

## 2023-07-13 DIAGNOSIS — K5900 Constipation, unspecified: Secondary | ICD-10-CM | POA: Diagnosis not present

## 2023-07-13 DIAGNOSIS — E039 Hypothyroidism, unspecified: Secondary | ICD-10-CM | POA: Diagnosis not present

## 2023-07-13 DIAGNOSIS — I69354 Hemiplegia and hemiparesis following cerebral infarction affecting left non-dominant side: Secondary | ICD-10-CM | POA: Diagnosis not present

## 2023-07-13 DIAGNOSIS — R269 Unspecified abnormalities of gait and mobility: Secondary | ICD-10-CM | POA: Diagnosis not present

## 2023-07-13 DIAGNOSIS — Z9181 History of falling: Secondary | ICD-10-CM | POA: Diagnosis not present

## 2023-07-13 DIAGNOSIS — E876 Hypokalemia: Secondary | ICD-10-CM | POA: Diagnosis not present

## 2023-07-13 DIAGNOSIS — N3000 Acute cystitis without hematuria: Secondary | ICD-10-CM | POA: Diagnosis not present

## 2023-07-13 DIAGNOSIS — M6281 Muscle weakness (generalized): Secondary | ICD-10-CM | POA: Diagnosis not present

## 2023-07-13 DIAGNOSIS — M21371 Foot drop, right foot: Secondary | ICD-10-CM | POA: Diagnosis not present

## 2023-07-13 LAB — URINE CULTURE: Culture: >=100000 — AB

## 2023-07-14 ENCOUNTER — Other Ambulatory Visit: Payer: Self-pay

## 2023-07-14 ENCOUNTER — Inpatient Hospital Stay
Admission: EM | Admit: 2023-07-14 | Discharge: 2023-07-18 | DRG: 871 | Disposition: A | Discharge status: Skilled Nursing Facility | Payer: Medicare Other | Source: Skilled Nursing Facility | Attending: Internal Medicine | Admitting: Internal Medicine

## 2023-07-14 ENCOUNTER — Inpatient Hospital Stay: Payer: Medicare Other

## 2023-07-14 ENCOUNTER — Emergency Department: Payer: Medicare Other

## 2023-07-14 DIAGNOSIS — G629 Polyneuropathy, unspecified: Secondary | ICD-10-CM | POA: Diagnosis not present

## 2023-07-14 DIAGNOSIS — F0394 Unspecified dementia, unspecified severity, with anxiety: Secondary | ICD-10-CM | POA: Diagnosis present

## 2023-07-14 DIAGNOSIS — M21372 Foot drop, left foot: Secondary | ICD-10-CM | POA: Diagnosis not present

## 2023-07-14 DIAGNOSIS — N390 Urinary tract infection, site not specified: Principal | ICD-10-CM | POA: Diagnosis present

## 2023-07-14 DIAGNOSIS — Z7982 Long term (current) use of aspirin: Secondary | ICD-10-CM | POA: Diagnosis not present

## 2023-07-14 DIAGNOSIS — Z8249 Family history of ischemic heart disease and other diseases of the circulatory system: Secondary | ICD-10-CM

## 2023-07-14 DIAGNOSIS — R6520 Severe sepsis without septic shock: Secondary | ICD-10-CM | POA: Diagnosis present

## 2023-07-14 DIAGNOSIS — Z471 Aftercare following joint replacement surgery: Secondary | ICD-10-CM | POA: Diagnosis not present

## 2023-07-14 DIAGNOSIS — M8580 Other specified disorders of bone density and structure, unspecified site: Secondary | ICD-10-CM | POA: Diagnosis not present

## 2023-07-14 DIAGNOSIS — Z743 Need for continuous supervision: Secondary | ICD-10-CM | POA: Diagnosis not present

## 2023-07-14 DIAGNOSIS — I10 Essential (primary) hypertension: Secondary | ICD-10-CM | POA: Diagnosis present

## 2023-07-14 DIAGNOSIS — G912 (Idiopathic) normal pressure hydrocephalus: Secondary | ICD-10-CM | POA: Diagnosis not present

## 2023-07-14 DIAGNOSIS — R000 Tachycardia, unspecified: Secondary | ICD-10-CM | POA: Diagnosis not present

## 2023-07-14 DIAGNOSIS — G9389 Other specified disorders of brain: Secondary | ICD-10-CM | POA: Diagnosis present

## 2023-07-14 DIAGNOSIS — Z66 Do not resuscitate: Secondary | ICD-10-CM | POA: Diagnosis present

## 2023-07-14 DIAGNOSIS — E039 Hypothyroidism, unspecified: Secondary | ICD-10-CM | POA: Diagnosis present

## 2023-07-14 DIAGNOSIS — F0393 Unspecified dementia, unspecified severity, with mood disturbance: Secondary | ICD-10-CM | POA: Diagnosis present

## 2023-07-14 DIAGNOSIS — R55 Syncope and collapse: Secondary | ICD-10-CM | POA: Diagnosis not present

## 2023-07-14 DIAGNOSIS — N3281 Overactive bladder: Secondary | ICD-10-CM | POA: Diagnosis not present

## 2023-07-14 DIAGNOSIS — S2241XA Multiple fractures of ribs, right side, initial encounter for closed fracture: Secondary | ICD-10-CM | POA: Diagnosis not present

## 2023-07-14 DIAGNOSIS — Z8261 Family history of arthritis: Secondary | ICD-10-CM | POA: Diagnosis not present

## 2023-07-14 DIAGNOSIS — R296 Repeated falls: Secondary | ICD-10-CM | POA: Diagnosis not present

## 2023-07-14 DIAGNOSIS — R69 Illness, unspecified: Secondary | ICD-10-CM | POA: Diagnosis not present

## 2023-07-14 DIAGNOSIS — Z79899 Other long term (current) drug therapy: Secondary | ICD-10-CM

## 2023-07-14 DIAGNOSIS — G9341 Metabolic encephalopathy: Secondary | ICD-10-CM | POA: Diagnosis not present

## 2023-07-14 DIAGNOSIS — F32A Depression, unspecified: Secondary | ICD-10-CM | POA: Diagnosis present

## 2023-07-14 DIAGNOSIS — E871 Hypo-osmolality and hyponatremia: Secondary | ICD-10-CM | POA: Diagnosis not present

## 2023-07-14 DIAGNOSIS — I700 Atherosclerosis of aorta: Secondary | ICD-10-CM | POA: Diagnosis not present

## 2023-07-14 DIAGNOSIS — Z8673 Personal history of transient ischemic attack (TIA), and cerebral infarction without residual deficits: Secondary | ICD-10-CM | POA: Diagnosis not present

## 2023-07-14 DIAGNOSIS — R4182 Altered mental status, unspecified: Secondary | ICD-10-CM | POA: Diagnosis not present

## 2023-07-14 DIAGNOSIS — Z803 Family history of malignant neoplasm of breast: Secondary | ICD-10-CM

## 2023-07-14 DIAGNOSIS — Z9181 History of falling: Secondary | ICD-10-CM | POA: Diagnosis not present

## 2023-07-14 DIAGNOSIS — K219 Gastro-esophageal reflux disease without esophagitis: Secondary | ICD-10-CM | POA: Diagnosis present

## 2023-07-14 DIAGNOSIS — E872 Acidosis, unspecified: Secondary | ICD-10-CM | POA: Diagnosis present

## 2023-07-14 DIAGNOSIS — A419 Sepsis, unspecified organism: Secondary | ICD-10-CM | POA: Diagnosis not present

## 2023-07-14 DIAGNOSIS — Z885 Allergy status to narcotic agent status: Secondary | ICD-10-CM

## 2023-07-14 DIAGNOSIS — R531 Weakness: Secondary | ICD-10-CM | POA: Diagnosis not present

## 2023-07-14 DIAGNOSIS — Z9049 Acquired absence of other specified parts of digestive tract: Secondary | ICD-10-CM

## 2023-07-14 DIAGNOSIS — M21371 Foot drop, right foot: Secondary | ICD-10-CM | POA: Diagnosis not present

## 2023-07-14 DIAGNOSIS — Z79890 Hormone replacement therapy (postmenopausal): Secondary | ICD-10-CM | POA: Diagnosis not present

## 2023-07-14 DIAGNOSIS — D696 Thrombocytopenia, unspecified: Secondary | ICD-10-CM | POA: Diagnosis not present

## 2023-07-14 DIAGNOSIS — Z90710 Acquired absence of both cervix and uterus: Secondary | ICD-10-CM

## 2023-07-14 DIAGNOSIS — R0989 Other specified symptoms and signs involving the circulatory and respiratory systems: Secondary | ICD-10-CM | POA: Diagnosis not present

## 2023-07-14 DIAGNOSIS — Z833 Family history of diabetes mellitus: Secondary | ICD-10-CM | POA: Diagnosis not present

## 2023-07-14 DIAGNOSIS — R6889 Other general symptoms and signs: Secondary | ICD-10-CM | POA: Diagnosis not present

## 2023-07-14 DIAGNOSIS — R269 Unspecified abnormalities of gait and mobility: Secondary | ICD-10-CM | POA: Diagnosis not present

## 2023-07-14 DIAGNOSIS — E876 Hypokalemia: Secondary | ICD-10-CM | POA: Diagnosis not present

## 2023-07-14 DIAGNOSIS — R410 Disorientation, unspecified: Secondary | ICD-10-CM | POA: Diagnosis not present

## 2023-07-14 DIAGNOSIS — R918 Other nonspecific abnormal finding of lung field: Secondary | ICD-10-CM | POA: Diagnosis not present

## 2023-07-14 DIAGNOSIS — F0390 Unspecified dementia without behavioral disturbance: Secondary | ICD-10-CM | POA: Diagnosis present

## 2023-07-14 DIAGNOSIS — I639 Cerebral infarction, unspecified: Secondary | ICD-10-CM | POA: Diagnosis not present

## 2023-07-14 DIAGNOSIS — I499 Cardiac arrhythmia, unspecified: Secondary | ICD-10-CM | POA: Diagnosis not present

## 2023-07-14 DIAGNOSIS — R0689 Other abnormalities of breathing: Secondary | ICD-10-CM | POA: Diagnosis not present

## 2023-07-14 DIAGNOSIS — R404 Transient alteration of awareness: Secondary | ICD-10-CM | POA: Diagnosis not present

## 2023-07-14 DIAGNOSIS — K5900 Constipation, unspecified: Secondary | ICD-10-CM | POA: Diagnosis not present

## 2023-07-14 LAB — URINALYSIS, W/ REFLEX TO CULTURE (INFECTION SUSPECTED)
Bacteria, UA: NONE SEEN
Bilirubin Urine: NEGATIVE
Glucose, UA: NEGATIVE mg/dL
Ketones, ur: 5 mg/dL — AB
Nitrite: NEGATIVE
Protein, ur: NEGATIVE mg/dL
Specific Gravity, Urine: 1.016 (ref 1.005–1.030)
WBC, UA: >50 WBC/hpf (ref 0–5)
pH: 5 (ref 5.0–8.0)

## 2023-07-14 LAB — CBC WITH DIFFERENTIAL/PLATELET
Abs Immature Granulocytes: 0.15 10*3/uL — ABNORMAL HIGH (ref 0.00–0.07)
Basophils Absolute: 0.1 10*3/uL (ref 0.0–0.1)
Basophils Relative: 0 %
Eosinophils Absolute: 0 10*3/uL (ref 0.0–0.5)
Eosinophils Relative: 0 %
HCT: 40.7 % (ref 36.0–46.0)
Hemoglobin: 13.6 g/dL (ref 12.0–15.0)
Immature Granulocytes: 1 %
Lymphocytes Relative: 2 %
Lymphs Abs: 0.4 10*3/uL — ABNORMAL LOW (ref 0.7–4.0)
MCH: 31 pg (ref 26.0–34.0)
MCHC: 33.4 g/dL (ref 30.0–36.0)
MCV: 92.7 fL (ref 80.0–100.0)
Monocytes Absolute: 0.7 10*3/uL (ref 0.1–1.0)
Monocytes Relative: 4 %
Neutro Abs: 16.4 10*3/uL — ABNORMAL HIGH (ref 1.7–7.7)
Neutrophils Relative %: 93 %
Platelets: 223 10*3/uL (ref 150–400)
RBC: 4.39 MIL/uL (ref 3.87–5.11)
RDW: 13.5 % (ref 11.5–15.5)
WBC: 17.7 10*3/uL — ABNORMAL HIGH (ref 4.0–10.5)
nRBC: 0 % (ref 0.0–0.2)

## 2023-07-14 LAB — T4, FREE: Free T4: 1.03 ng/dL (ref 0.61–1.12)

## 2023-07-14 LAB — COMPREHENSIVE METABOLIC PANEL
ALT: 29 U/L (ref 0–44)
AST: 40 U/L (ref 15–41)
Albumin: 4 g/dL (ref 3.5–5.0)
Alkaline Phosphatase: 111 U/L (ref 38–126)
Anion gap: 12 (ref 5–15)
BUN: 19 mg/dL (ref 8–23)
CO2: 23 mmol/L (ref 22–32)
Calcium: 10.7 mg/dL — ABNORMAL HIGH (ref 8.9–10.3)
Chloride: 99 mmol/L (ref 98–111)
Creatinine, Ser: 0.81 mg/dL (ref 0.44–1.00)
GFR, Estimated: >60 mL/min (ref >60)
Glucose, Bld: 188 mg/dL — ABNORMAL HIGH (ref 70–99)
Potassium: 4.1 mmol/L (ref 3.5–5.1)
Sodium: 134 mmol/L — ABNORMAL LOW (ref 135–145)
Total Bilirubin: 0.7 mg/dL (ref <1.2)
Total Protein: 7.5 g/dL (ref 6.5–8.1)

## 2023-07-14 LAB — TSH: TSH: 2.241 u[IU]/mL (ref 0.350–4.500)

## 2023-07-14 LAB — LACTIC ACID, PLASMA
Lactic Acid, Venous: 3.4 mmol/L (ref 0.5–1.9)
Lactic Acid, Venous: 2.3 mmol/L (ref 0.5–1.9)

## 2023-07-14 LAB — MAGNESIUM: Magnesium: 1.8 mg/dL (ref 1.7–2.4)

## 2023-07-14 MED ORDER — VANCOMYCIN HCL IN DEXTROSE 1-5 GM/200ML-% IV SOLN: 1000.0000 mg | INTRAVENOUS | Status: DC

## 2023-07-14 MED ORDER — MIRABEGRON ER 50 MG PO TB24
50.0000 mg | ORAL_TABLET | Freq: Every day | ORAL | Status: DC
  Administered 2023-07-15 – 2023-07-18 (×4): 50 mg via ORAL
  Filled 2023-07-14 (×4): qty 1

## 2023-07-14 MED ORDER — SODIUM CHLORIDE 0.9 % IV SOLN
1.0000 g | Freq: Once | INTRAVENOUS | Status: AC
  Administered 2023-07-14: 1 g via INTRAVENOUS
  Filled 2023-07-14: qty 10

## 2023-07-14 MED ORDER — ASPIRIN 81 MG PO TBEC
81.0000 mg | DELAYED_RELEASE_TABLET | Freq: Every day | ORAL | Status: DC
  Administered 2023-07-15 – 2023-07-18 (×4): 81 mg via ORAL
  Filled 2023-07-14 (×4): qty 1

## 2023-07-14 MED ORDER — LACTATED RINGERS IV BOLUS
1000.0000 mL | Freq: Once | INTRAVENOUS | Status: AC
  Administered 2023-07-14: 1000 mL via INTRAVENOUS

## 2023-07-14 MED ORDER — LEVOTHYROXINE SODIUM 50 MCG PO TABS
50.0000 ug | ORAL_TABLET | Freq: Every day | ORAL | Status: DC
  Administered 2023-07-15 – 2023-07-18 (×4): 50 ug via ORAL
  Filled 2023-07-14 (×4): qty 1

## 2023-07-14 MED ORDER — VANCOMYCIN HCL IN DEXTROSE 1-5 GM/200ML-% IV SOLN: 1000.0000 mg | Freq: Once | INTRAVENOUS | Status: DC

## 2023-07-14 MED ORDER — ONDANSETRON HCL 4 MG PO TABS: 4.0000 mg | ORAL_TABLET | Freq: Four times a day (QID) | ORAL | Status: DC | PRN

## 2023-07-14 MED ORDER — MIRTAZAPINE 15 MG PO TABS
7.5000 mg | ORAL_TABLET | Freq: Every day | ORAL | Status: DC
  Administered 2023-07-15 – 2023-07-17 (×3): 7.5 mg via ORAL
  Filled 2023-07-14 (×3): qty 1

## 2023-07-14 MED ORDER — SERTRALINE HCL 50 MG PO TABS
50.0000 mg | ORAL_TABLET | Freq: Every day | ORAL | Status: DC
  Administered 2023-07-15 – 2023-07-17 (×3): 50 mg via ORAL
  Filled 2023-07-14 (×3): qty 1

## 2023-07-14 MED ORDER — HEPARIN SODIUM (PORCINE) 5000 UNIT/ML IJ SOLN
5000.0000 [IU] | Freq: Three times a day (TID) | INTRAMUSCULAR | Status: DC
  Administered 2023-07-15 – 2023-07-18 (×11): 5000 [IU] via SUBCUTANEOUS
  Filled 2023-07-14 (×11): qty 1

## 2023-07-14 MED ORDER — ACETAMINOPHEN 650 MG RE SUPP
650.0000 mg | Freq: Four times a day (QID) | RECTAL | Status: DC | PRN
  Administered 2023-07-14: 650 mg via RECTAL
  Filled 2023-07-14: qty 2

## 2023-07-14 MED ORDER — HYDROCHLOROTHIAZIDE 12.5 MG PO TABS: 12.5000 mg | ORAL_TABLET | Freq: Every day | ORAL | Status: DC

## 2023-07-14 MED ORDER — ONDANSETRON HCL 4 MG/2ML IJ SOLN: 4.0000 mg | Freq: Four times a day (QID) | INTRAMUSCULAR | Status: DC | PRN

## 2023-07-14 MED ORDER — SODIUM CHLORIDE 0.9 % IV SOLN
1.0000 g | Freq: Once | INTRAVENOUS | Status: DC
  Administered 2023-07-14: 1 g via INTRAVENOUS
  Filled 2023-07-14: qty 10

## 2023-07-14 MED ORDER — ACETAMINOPHEN 325 MG PO TABS: 650.0000 mg | ORAL_TABLET | Freq: Four times a day (QID) | ORAL | Status: DC | PRN

## 2023-07-14 MED ORDER — VANCOMYCIN HCL 1750 MG/350ML IV SOLN
1750.0000 mg | Freq: Once | INTRAVENOUS | Status: AC
  Administered 2023-07-14: 1750 mg via INTRAVENOUS
  Filled 2023-07-14: qty 350

## 2023-07-14 MED ORDER — SODIUM CHLORIDE 0.9 % IV SOLN: 1.0000 g | INTRAVENOUS | Status: DC

## 2023-07-14 MED ORDER — SODIUM CHLORIDE 0.9 % IV SOLN
2.0000 g | INTRAVENOUS | Status: DC
  Administered 2023-07-15 – 2023-07-16 (×2): 2 g via INTRAVENOUS
  Filled 2023-07-14 (×3): qty 20

## 2023-07-14 MED ORDER — LOSARTAN POTASSIUM 50 MG PO TABS: 100.0000 mg | ORAL_TABLET | Freq: Every day | ORAL | Status: DC

## 2023-07-14 NOTE — Assessment & Plan Note (Addendum)
Patient does have a history of infarcts in the previous MRI, home medications include aspirin 81 which we will continue.  Exam today patient is nonfocal as far as range of motion and is moving all 4 extremities spontaneously cranial nerves grossly intact.

## 2023-07-14 NOTE — Progress Notes (Signed)
Pharmacy Antibiotic Note  Margaret Francis is a 89 y.o. female admitted on 07/14/2023 with sepsis.  Pharmacy has been consulted for Vancomycin dosing.  Plan: Vancomycin 1750 mg IV X 1 ordered for 11/23 @ 2300. Vancomycin 1 gm IV Q24H ordered to start on 11/24 @ 2300.  AUC = 482.9 Vanc trough = 12.3   Height: 5\' 7"  (170.2 cm) Weight: 70.3 kg (154 lb 15.7 oz) IBW/kg (Calculated) : 61.6  Temp (24hrs), Avg:101.2 F (38.4 C), Min:101.2 F (38.4 C), Max:101.2 F (38.4 C)  Recent Labs  Lab 07/14/23 1906 07/14/23 1910  WBC  --  17.7*  CREATININE  --  0.81  LATICACIDVEN 3.4*  --     Estimated Creatinine Clearance: 44 mL/min (by C-G formula based on SCr of 0.81 mg/dL).    Allergies  Allergen Reactions   Demerol Nausea And Vomiting   Morphine And Codeine Nausea And Vomiting    Antimicrobials this admission:   >>    >>   Dose adjustments this admission:   Microbiology results:  BCx:   UCx:    Sputum:    MRSA PCR:   Thank you for allowing pharmacy to be a part of this patient's care.  Kuzey Ogata D 07/14/2023 10:31 PM

## 2023-07-14 NOTE — Assessment & Plan Note (Signed)
Continue patient on levothyroxine at 50 mcg.  Free T4 is 1.03 and TSH is 2.241.

## 2023-07-14 NOTE — ED Provider Notes (Signed)
[facility] Provider Note    Event Date/Time   First MD Initiated Contact with Patient 07/14/23 1905     (approximate)   History   AMS  HPI  Margaret Francis is a 89 y.o. female who presents to the emergency department today from her living facility because of concerns for altered mental status.  Patient is unable to give any significant history.  History is obtained partially by family at bedside.  They state that the patient has been not quite herself for about a week. Was seen in the ER 4 days ago after a fall and diagnosed with a UTI. Patient has been on antibiotics since then. Did have fever at living facility.     Physical Exam   Triage Vital Signs: ED Triage Vitals  Encounter Vitals Group     BP 07/14/23 1901 (!) 149/72     Systolic BP Percentile --      Diastolic BP Percentile --      Pulse Rate 07/14/23 1901 99     Resp 07/14/23 1901 (!) 24     Temp 07/14/23 1901 (!) 101.2 F (38.4 C)     Temp Source 07/14/23 1901 Rectal     SpO2 07/14/23 1900 95 %     Weight 07/14/23 1904 154 lb 15.7 oz (70.3 kg)     Height 07/14/23 1904 5\' 7"  (1.702 m)     Head Circumference --      Peak Flow --      Pain Score 07/14/23 1902 0     Pain Loc --      Pain Education --      Exclude from Growth Chart --     Most recent vital signs: Vitals:   07/14/23 1900 07/14/23 1901  BP:  (!) 149/72  Pulse:  99  Resp:  (!) 24  Temp:  (!) 101.2 F (38.4 C)  SpO2: 95% 95%    General: Awake, alert, not oriented. CV:  Good peripheral perfusion. Regular rate and rhythm. Resp:  Normal effort. Tachypnea. Lungs clear. Abd:  No distention. Non tender.   ED Results / Procedures / Treatments   Labs (all labs ordered are listed, but only abnormal results are displayed) Labs Reviewed  LACTIC ACID, PLASMA - Abnormal; Notable for the following components:      Result Value   Lactic Acid, Venous 3.4 (*)    All other components within normal limits  COMPREHENSIVE  METABOLIC PANEL - Abnormal; Notable for the following components:   Sodium 134 (*)    Glucose, Bld 188 (*)    Calcium 10.7 (*)    All other components within normal limits  CBC WITH DIFFERENTIAL/PLATELET - Abnormal; Notable for the following components:   WBC 17.7 (*)    Neutro Abs 16.4 (*)    Lymphs Abs 0.4 (*)    Abs Immature Granulocytes 0.15 (*)    All other components within normal limits  URINALYSIS, W/ REFLEX TO CULTURE (INFECTION SUSPECTED) - Abnormal; Notable for the following components:   Color, Urine YELLOW (*)    APPearance HAZY (*)    Hgb urine dipstick MODERATE (*)    Ketones, ur 5 (*)    Leukocytes,Ua MODERATE (*)    All other components within normal limits  CULTURE, BLOOD (ROUTINE X 2)  CULTURE, BLOOD (ROUTINE X 2)  URINE CULTURE  LACTIC ACID, PLASMA  HEMOGLOBIN A1C  TSH  T4, FREE  PROTIME-INR  CORTISOL-AM, BLOOD  PROCALCITONIN  MAGNESIUM  EKG  I, Phineas Semen, attending physician, personally viewed and interpreted this EKG  EKG Time: 1957 Rate: 101 Rhythm: sinus tachycardia Axis: normal Intervals: qtc 602 QRS: narrow ST changes: no st elevation Impression: abnormal ekg   RADIOLOGY I independently interpreted and visualized the CXR. My interpretation: No pneumonia Radiology interpretation:  IMPRESSION:  Low lung volumes with left basilar atelectasis or infiltrates.      PROCEDURES:  Critical Care performed: Yes  CRITICAL CARE Performed by: Phineas Semen   Total critical care time: 30 minutes  Critical care time was exclusive of separately billable procedures and treating other patients.  Critical care was necessary to treat or prevent imminent or life-threatening deterioration.  Critical care was time spent personally by me on the following activities: development of treatment plan with patient and/or surrogate as well as nursing, discussions with consultants, evaluation of patient's response to treatment, examination of  patient, obtaining history from patient or surrogate, ordering and performing treatments and interventions, ordering and review of laboratory studies, ordering and review of radiographic studies, pulse oximetry and re-evaluation of patient's condition.   Procedures    MEDICATIONS ORDERED IN ED: Medications  cefTRIAXone (ROCEPHIN) 1 g in sodium chloride 0.9 % 100 mL IVPB (has no administration in time range)  lactated ringers bolus 1,000 mL (has no administration in time range)     IMPRESSION / MDM / ASSESSMENT AND PLAN / ED COURSE  I reviewed the triage vital signs and the nursing notes.                              Differential diagnosis includes, but is not limited to, infection, anemia, electrolyte abnormality  Patient's presentation is most consistent with acute presentation with potential threat to life or bodily function.   The patient is on the cardiac monitor to evaluate for evidence of arrhythmia and/or significant heart rate changes.  She presented to the emergency department today because of concerns for altered mental status in setting of a recently diagnosed urinary tract infection.  On exam patient is awake and alert but not oriented.  Patient was noted to be febrile here in the emergency department.  Blood work does show a leukocytosis.  Patient was started on broad-spectrum IV antibiotics.  Lactic acid was elevated to 3.4.  Discussed with Dr. Allena Katz with the hospitalist service who will evaluate for admission.    FINAL CLINICAL IMPRESSION(S) / ED DIAGNOSES   Final diagnoses:  Lower urinary tract infectious disease  Altered mental status, unspecified altered mental status type     Note:  This document was prepared using Dragon voice recognition software and may include unintentional dictation errors.    Phineas Semen, MD 07/14/23 2220

## 2023-07-14 NOTE — H&P (Signed)
History and Physical    Patient: Margaret Francis UUV:[id number] DOB: 01-24-1930 DOA: 07/14/2023 DOS: the patient was seen and examined on 07/14/2023 PCP: Smiley Houseman, NP  Patient coming from: SNF  Chief Complaint  Patient presents with   Altered Mental Status    Pt. Brought in via EMS from [place] for AMS, was here recently for fall and diagnosed with UTI, abx were changed yesterday and today patient is altered, has N/V and fever, A/O x 0   Code Sepsis    HPI: Margaret Francis is a 89 y.o. female with medical history significant for allergies to Demerol morphine and codeine, dementia, ambulatory dysfunction, bilateral foot drop, frequent falls, hypertension, hypothyroid, GERD, depression, comes in today for confusion and altered mental status.  Chart shows patient was seen on 19 November as well for a fall patient had tripped and fallen striking her head on the 19th.  Head CT was negative and patient fortunately did not sustain any acute traumatic injury.  Patient was found to have urinary symptoms with positive urinalysis and was sent with Keflex p.o. In emergency room vitals trend shows: Patient meeting severe sepsis criteria. Vitals:   07/14/23 1904 07/14/23 1930 07/14/23 2000 07/14/23 2030  BP:  (!) 154/89 (!) 140/79 (!) 145/92  Pulse:  99 100 97  Temp:      Resp:  (!) 24 (!) 25 (!) 26  Height: 5\' 7"  (1.702 m)     Weight: 70.3 kg     SpO2:  94% 94% 98%  TempSrc:      BMI (Calculated): 24.27     Metabolic panel shows hyponatremia 134 glucose 188 calcium 10.7 normal kidney function normal LFTs lactic 3.4. CBC showing white count of 17.7 normal hemoglobin normal platelets. Urinalysis done on the 19th showed yellow hazy urine positive nitrite more than 50 WBC with Klebsiella aerogenes more than 100,000 colonies  In the ED pt received: Medications  vancomycin (VANCOCIN) IVPB 1000 mg/200 mL premix (has no administration in time range)  cefTRIAXone (ROCEPHIN) 1 g in sodium  chloride 0.9 % 100 mL IVPB (0 g Intravenous Stopped 07/14/23 2041)  lactated ringers bolus 1,000 mL (1,000 mLs Intravenous New Bag/Given 07/14/23 2013)   Review of Systems  Unable to perform ROS: Dementia   Past Medical History:  Diagnosis Date   Abnormality of gait 03/20/2013   Bilateral foot-drop 07/28/2020   Cerebral ventriculomegaly    Gastroesophageal reflux disease    History of stroke    Right inferior cerebellum   Hypertension    Osteopenia    Peripheral neuropathy 11/29/2020   Stroke [facility])    Thyroid disease    Urinary frequency 04/19/2011   Urinary incontinence    Past Surgical History:  Procedure Laterality Date   ABDOMINAL HYSTERECTOMY     APPENDECTOMY     BACK SURGERY     x2   BIOPSY VULVA  01/19/2023   BLADDER SURGERY  2010   CHOLECYSTECTOMY     ESOPHAGUS SURGERY     tumor removal.   TONSILLECTOMY     at [facility]  2010    reports that she has never smoked. She has never used smokeless tobacco. She reports that she does not drink alcohol and does not use drugs.  Allergies  Allergen Reactions   Demerol Nausea And Vomiting   Morphine And Codeine Nausea And Vomiting    Family History  Problem Relation Age of Onset   Diabetes Mother    Hypertension Mother  Arthritis Father        died at age 82   Breast cancer Sister 60       mastectomy    Prior to Admission medications   Medication Sig Start Date End Date Taking? Authorizing Provider  aspirin 81 MG tablet Take 81 mg by mouth daily.    [provider]  Calcium Carbonate Antacid 400 MG CHEW Chew 400 mg by mouth every 8 (eight) hours as needed.    [provider]  cephALEXin (KEFLEX) 500 MG capsule Take 1 capsule (500 mg total) by mouth 2 (two) times daily for 7 days. 07/10/23 07/17/23  [name], MD  Cholecalciferol (VITAMIN D3) 50 MCG (2000 UT) CAPS Take 2,000 Units by mouth daily.    [provider]  clobetasol ointment (TEMOVATE) 0.05 % Spread the labia/lips  as wide as possible and apply half applicator full tube of medicine to the opening of the vagina qhs x 2 month, then three times  weekly until seen in the office. 03/22/23   [place] Bing, MD  hydrochlorothiazide (HYDRODIURIL) 12.5 MG tablet Take 12.5 mg by mouth daily. 04/02/23   [provider]  levothyroxine (SYNTHROID, LEVOTHROID) 50 MCG tablet Take 50 mcg by mouth daily.    [provider]  losartan (COZAAR) 100 MG tablet Take 100 mg by mouth daily. 11/10/21   [provider]  mirabegron ER (MYRBETRIQ) 50 MG TB24 tablet Take 1 tablet (50 mg total) by mouth daily. 05/30/21   [place] Spaniel, MD  mirtazapine (REMERON) 7.5 MG tablet Take 7.5 mg by mouth at bedtime. 12/26/21   [provider]  pantoprazole (PROTONIX) 20 MG tablet Take 20 mg by mouth daily.    [provider]  polyethylene glycol (MIRALAX / GLYCOLAX) 17 g packet Take 17 g by mouth at bedtime. Given at bedtime on Mon, Wed and Fri    [provider]  psyllium (REGULOID) 0.52 g capsule Take 0.52 g by mouth 2 (two) times daily.    [provider]  sertraline (ZOLOFT) 50 MG tablet 1 tablet 11/29/21   [provider]  Zinc Oxide 15 % CREA Apply 1 application  topically 3 (three) times daily.    [provider]     Vitals:   07/14/23 1904 07/14/23 1930 07/14/23 2000 07/14/23 2030  BP:  (!) 154/89 (!) 140/79 (!) 145/92  Pulse:  99 100 97  Resp:  (!) 24 (!) 25 (!) 26  Temp:      TempSrc:      SpO2:  94% 94% 98%  Weight: 70.3 kg     Height: 5\' 7"  (1.702 m)      Physical Exam Vitals and nursing note reviewed.  Constitutional:      General: She is not in acute distress. HENT:     Head: Normocephalic and atraumatic.     Right Ear: Hearing normal.     Left Ear: Hearing normal.     Nose: Nose normal. No nasal deformity.     Mouth/Throat:     Lips: Pink.     Tongue: No lesions.  Eyes:     General: Lids are normal.     Extraocular Movements:  Extraocular movements intact.  Cardiovascular:     Rate and Rhythm: Normal rate and regular rhythm.     Heart sounds: Normal heart sounds.  Pulmonary:     Effort: Pulmonary effort is normal.     Breath sounds: Normal breath sounds.  Abdominal:  General: Bowel sounds are normal. There is no distension.     Palpations: Abdomen is soft. There is no mass.     Tenderness: There is no abdominal tenderness.  Musculoskeletal:     Right lower leg: No edema.     Left lower leg: No edema.  Skin:    General: Skin is warm.  Neurological:     General: No focal deficit present.     Mental Status: She is alert. She is disoriented.     Cranial Nerves: Cranial nerves 2-12 are intact.     Motor: No weakness.     Comments: Patient moving all 4 extremities spontaneously cranial nerves grossly intact.  Psychiatric:        Mood and Affect: Mood normal.        Speech: Speech normal.        Behavior: Behavior is cooperative.      Labs on Admission: I have personally reviewed following labs and imaging studies Results for orders placed or performed during the hospital encounter of 07/14/23 (from the past 24 hour(s))  Lactic acid, plasma     Status: Abnormal   Collection Time: 07/14/23  7:06 PM  Result Value Ref Range   Lactic Acid, Venous 3.4 (HH) 0.5 - 1.9 mmol/L  Comprehensive metabolic panel     Status: Abnormal   Collection Time: 07/14/23  7:10 PM  Result Value Ref Range   Sodium 134 (L) 135 - 145 mmol/L   Potassium 4.1 3.5 - 5.1 mmol/L   Chloride 99 98 - 111 mmol/L   CO2 23 22 - 32 mmol/L   Glucose, Bld 188 (H) 70 - 99 mg/dL   BUN 19 8 - 23 mg/dL   Creatinine, Ser 3.29 0.44 - 1.00 mg/dL   Calcium 51.8 (H) 8.9 - 10.3 mg/dL   Total Protein 7.5 6.5 - 8.1 g/dL   Albumin 4.0 3.5 - 5.0 g/dL   AST 40 15 - 41 U/L   ALT 29 0 - 44 U/L   Alkaline Phosphatase 111 38 - 126 U/L   Total Bilirubin 0.7 <1.2 mg/dL   GFR, Estimated >84 >16 mL/min   Anion gap 12 5 - 15  CBC with Differential      Status: Abnormal   Collection Time: 07/14/23  7:10 PM  Result Value Ref Range   WBC 17.7 (H) 4.0 - 10.5 K/uL   RBC 4.39 3.87 - 5.11 MIL/uL   Hemoglobin 13.6 12.0 - 15.0 g/dL   HCT 60.6 30.1 - 60.1 %   MCV 92.7 80.0 - 100.0 fL   MCH 31.0 26.0 - 34.0 pg   MCHC 33.4 30.0 - 36.0 g/dL   RDW 09.3 23.5 - 57.3 %   Platelets 223 150 - 400 K/uL   nRBC 0.0 0.0 - 0.2 %   Neutrophils Relative % 93 %   Neutro Abs 16.4 (H) 1.7 - 7.7 K/uL   Lymphocytes Relative 2 %   Lymphs Abs 0.4 (L) 0.7 - 4.0 K/uL   Monocytes Relative 4 %   Monocytes Absolute 0.7 0.1 - 1.0 K/uL   Eosinophils Relative 0 %   Eosinophils Absolute 0.0 0.0 - 0.5 K/uL   Basophils Relative 0 %   Basophils Absolute 0.1 0.0 - 0.1 K/uL   Immature Granulocytes 1 %   Abs Immature Granulocytes 0.15 (H) 0.00 - 0.07 K/uL  Urinalysis, w/ Reflex to Culture (Infection Suspected) -Urine, Catheterized     Status: Abnormal   Collection Time: 07/14/23  8:48 PM  Result Value Ref Range   Specimen Source URINE, CATHETERIZED    Color, Urine YELLOW (A) YELLOW   APPearance HAZY (A) CLEAR   Specific Gravity, Urine 1.016 1.005 - 1.030   pH 5.0 5.0 - 8.0   Glucose, UA NEGATIVE NEGATIVE mg/dL   Hgb urine dipstick MODERATE (A) NEGATIVE   Bilirubin Urine NEGATIVE NEGATIVE   Ketones, ur 5 (A) NEGATIVE mg/dL   Protein, ur NEGATIVE NEGATIVE mg/dL   Nitrite NEGATIVE NEGATIVE   Leukocytes,Ua MODERATE (A) NEGATIVE   RBC / HPF 21-50 0 - 5 RBC/hpf   WBC, UA >50 0 - 5 WBC/hpf   Bacteria, UA NONE SEEN NONE SEEN   Squamous Epithelial / HPF 0-5 0 - 5 /HPF   Mucus PRESENT     CBC: Recent Labs  Lab 07/14/23 1910  WBC 17.7*  NEUTROABS 16.4*  HGB 13.6  HCT 40.7  MCV 92.7  PLT 223   Basic Metabolic Panel: Recent Labs  Lab 07/14/23 1910  NA 134*  K 4.1  CL 99  CO2 23  GLUCOSE 188*  BUN 19  CREATININE 0.81  CALCIUM 10.7*   GFR: Estimated Creatinine Clearance: 44 mL/min (by C-G formula based on SCr of 0.81 mg/dL). Liver Function Tests: Recent  Labs  Lab 07/14/23 1910  AST 40  ALT 29  ALKPHOS 111  BILITOT 0.7  PROT 7.5  ALBUMIN 4.0   No results for input(s): "LIPASE", "AMYLASE" in the last 168 hours. No results for input(s): "AMMONIA" in the last 168 hours. Coagulation Profile: No results for input(s): "INR", "PROTIME" in the last 168 hours. Cardiac Enzymes: No results for input(s): "CKTOTAL", "CKMB", "CKMBINDEX", "TROPONINI" in the last 168 hours. BNP (last 3 results) No results for input(s): "PROBNP" in the last 8760 hours. HbA1C: No results for input(s): "HGBA1C" in the last 72 hours. CBG: No results for input(s): "GLUCAP" in the last 168 hours. Lipid Profile: No results for input(s): "CHOL", "HDL", "LDLCALC", "TRIG", "CHOLHDL", "LDLDIRECT" in the last 72 hours. Thyroid Function Tests: No results for input(s): "TSH", "T4TOTAL", "FREET4", "T3FREE", "THYROIDAB" in the last 72 hours. Anemia Panel: No results for input(s): "VITAMINB12", "FOLATE", "FERRITIN", "TIBC", "IRON", "RETICCTPCT" in the last 72 hours. Urinalysis    Component Value Date/Time   COLORURINE YELLOW (A) 07/14/2023 2048   APPEARANCEUR HAZY (A) 07/14/2023 2048   LABSPEC 1.016 07/14/2023 2048   PHURINE 5.0 07/14/2023 2048   GLUCOSEU NEGATIVE 07/14/2023 2048   HGBUR MODERATE (A) 07/14/2023 2048   BILIRUBINUR NEGATIVE 07/14/2023 2048   BILIRUBINUR neg 04/19/2011 1221   KETONESUR 5 (A) 07/14/2023 2048   PROTEINUR NEGATIVE 07/14/2023 2048   UROBILINOGEN neg 04/19/2011 1221   NITRITE NEGATIVE 07/14/2023 2048   LEUKOCYTESUR MODERATE (A) 07/14/2023 2048   Unresulted Labs (From admission, onward)     Start     Ordered   07/14/23 2048  Urine Culture  Once,   R        07/14/23 2048   07/14/23 1906  Lactic acid, plasma  (Undifferentiated presentation (screening labs and basic nursing orders))  Now then every 2 hours,   STAT      07/14/23 1905   07/14/23 1906  Blood Culture (routine x 2)  (Undifferentiated presentation (screening labs and basic nursing  orders))  BLOOD CULTURE X 2,   STAT      07/14/23 1905            Medications  vancomycin (VANCOCIN) IVPB 1000 mg/200 mL premix (has no administration in time range)  cefTRIAXone (  ROCEPHIN) 1 g in sodium chloride 0.9 % 100 mL IVPB (0 g Intravenous Stopped 07/14/23 2041)  lactated ringers bolus 1,000 mL (1,000 mLs Intravenous New Bag/Given 07/14/23 2013)    Radiological Exams on Admission: DG Chest Port 1 View  Result Date: 07/14/2023 CLINICAL DATA:  Questionable sepsis. Poor historian. Evaluate for abnormality. EXAM: PORTABLE CHEST 1 VIEW COMPARISON:  05/08/2023 FINDINGS: Stable cardiomediastinal silhouette. Aortic atherosclerotic calcification. Low lung volumes. Left basilar atelectasis or infiltrates. Otherwise no focal consolidation, pleural effusion, or pneumothorax. Remote right rib fractures. IMPRESSION: Low lung volumes with left basilar atelectasis or infiltrates. Electronically Signed   By: Minerva Fester M.D.   On: 07/14/2023 19:38     Data Reviewed: Relevant notes from primary care and specialist visits, past discharge summaries as available in EHR, including Care Everywhere. Prior diagnostic testing as pertinent to current admission diagnoses Updated medications and problem lists for reconciliation ED course, including vitals, labs, imaging, treatment and response to treatment Triage notes, nursing and pharmacy notes and ED provider's notes Notable results as noted in HPI  Assessment & Plan AMS (altered mental status) Attribute patient's disorientation to her current UTI and sepsis presentation.  Chart reviewed patient's had MRI in the past x 2 that has mention ventriculomegaly and possible NPH.  Will proceed with an MRI of the brain as well and follow.  Sepsis secondary to UTI [facility]) Recent Results (from the past 240 hour(s))  Urine Culture     Status: Abnormal   Collection Time: 07/10/23  5:01 PM   Specimen: Urine, Catheterized  Result Value Ref Range Status    Specimen Description   Final    URINE, CATHETERIZED Performed at [facility], [address], [place], [place] [address]    Special Requests   Final    NONE Performed at [facility], [address] [place], [place] [address]    Culture >=100,000 COLONIES/mL KLEBSIELLA AEROGENES (A)  Final   Report Status 07/13/2023 FINAL  Final   Organism ID, Bacteria KLEBSIELLA AEROGENES (A)  Final      Susceptibility   Klebsiella aerogenes - MIC*    CEFEPIME <=0.12 SENSITIVE Sensitive     CEFTRIAXONE <=0.25 SENSITIVE Sensitive     CIPROFLOXACIN <=0.25 SENSITIVE Sensitive     GENTAMICIN <=1 SENSITIVE Sensitive     IMIPENEM 2 SENSITIVE Sensitive     NITROFURANTOIN 128 RESISTANT Resistant     TRIMETH/SULFA <=20 SENSITIVE Sensitive     PIP/TAZO <=4 SENSITIVE Sensitive ug/mL    * >=100,000 COLONIES/mL KLEBSIELLA AEROGENES  Continue Rocephin. Continue low rate IV fluids. Hypertension Vitals:   07/14/23 1901 07/14/23 1930 07/14/23 2000 07/14/23 2030  BP: (!) 149/72 (!) 154/89 (!) 140/79 (!) 145/92  Home regimen includes HCTZ at 12.5 and losartan at 100 mg, blood pressure is well-controlled, will continue both meds, kidney function is within normal limits.  History of stroke Patient does have a history of infarcts in the previous MRI, home medications include aspirin 81 which we will continue.  Exam today patient is nonfocal as far as range of motion and is moving all 4 extremities spontaneously cranial nerves grossly intact. Severe sepsis [facility]) Patient meeting severe sepsis criteria with white count lactic acidosis source of infection and vitals. In the emergency room patient was given Rocephin along with vancomycin due to sepsis protocol. Chart reviewed and patient's recent blood culture reviewed will continue patient on Rocephin and hold vancomycin.  Patient is already received a single dose.  Pharmacy consult - discontinued Hypothyroidism  Continue patient on levothyroxine at  50 mcg.  Free T4 is 1.03 and TSH is 2.241. Dementia without behavioral disturbance (HCC) Suspect patient has underlying dementia combination of vascular dementia and possibly Alzheimer's.  Will continue patient on Remeron and Zoloft.   DVT prophylaxis:  Heparin   Consults:  None   Advance Care Planning:    Code Status: Prior   Family Communication:  None   Disposition Plan:  Home   Severity of Illness: The appropriate patient status for this patient is INPATIENT. Inpatient status is judged to be reasonable and necessary in order to provide the required intensity of service to ensure the patient's safety. The patient's presenting symptoms, physical exam findings, and initial radiographic and laboratory data in the context of their chronic comorbidities is felt to place them at high risk for further clinical deterioration. Furthermore, it is not anticipated that the patient will be medically stable for discharge from the hospital within 2 midnights of admission.   * I certify that at the point of admission it is my clinical judgment that the patient will require inpatient hospital care spanning beyond 2 midnights from the point of admission due to high intensity of service, high risk for further deterioration and high frequency of surveillance required.*  Author: Gertha Calkin, MD 07/14/2023 9:44 PM  For on call review [email or web address].

## 2023-07-14 NOTE — ED Notes (Signed)
Dr Derrill Kay informed of critical lactic acid of 3.4 - vorb and verified from MD not to draw 2nd lactic until after fluid bolus has infused.

## 2023-07-14 NOTE — ED Notes (Signed)
VORB and verified from Dr Derrill Kay to do 2nd lactic now that pt has had fluid bolus.

## 2023-07-14 NOTE — Assessment & Plan Note (Addendum)
Suspect patient has underlying dementia combination of vascular dementia and possibly Alzheimer's.  Will continue patient on Remeron and Zoloft.

## 2023-07-14 NOTE — Assessment & Plan Note (Addendum)
Recent Results (from the past 240 hour(s))  Urine Culture     Status: Abnormal   Collection Time: 07/10/23  5:01 PM   Specimen: Urine, Catheterized  Result Value Ref Range Status   Specimen Description   Final    URINE, CATHETERIZED Performed at [facility], [address], [place], [place] [address]    Special Requests   Final    NONE Performed at [facility], [address] [place], [place] [address]    Culture >=100,000 COLONIES/mL KLEBSIELLA AEROGENES (A)  Final   Report Status 07/13/2023 FINAL  Final   Organism ID, Bacteria KLEBSIELLA AEROGENES (A)  Final      Susceptibility   Klebsiella aerogenes - MIC*    CEFEPIME <=0.12 SENSITIVE Sensitive     CEFTRIAXONE <=0.25 SENSITIVE Sensitive     CIPROFLOXACIN <=0.25 SENSITIVE Sensitive     GENTAMICIN <=1 SENSITIVE Sensitive     IMIPENEM 2 SENSITIVE Sensitive     NITROFURANTOIN 128 RESISTANT Resistant     TRIMETH/SULFA <=20 SENSITIVE Sensitive     PIP/TAZO <=4 SENSITIVE Sensitive ug/mL    * >=100,000 COLONIES/mL KLEBSIELLA AEROGENES  Continue Rocephin. Continue low rate IV fluids.

## 2023-07-14 NOTE — Assessment & Plan Note (Addendum)
Continue patient on levothyroxine at 50 mcg.  Free T4 is 1.03 and TSH is 2.241.

## 2023-07-14 NOTE — Assessment & Plan Note (Addendum)
Patient meeting severe sepsis criteria with white count lactic acidosis source of infection and vitals. In the emergency room patient was given Rocephin along with vancomycin due to sepsis protocol. Chart reviewed and patient's recent blood culture reviewed will continue patient on Rocephin and hold vancomycin.  Patient is already received a single dose.  Pharmacy consult - discontinued

## 2023-07-14 NOTE — Assessment & Plan Note (Addendum)
Vitals:   07/14/23 1901 07/14/23 1930 07/14/23 2000 07/14/23 2030  BP: (!) 149/72 (!) 154/89 (!) 140/79 (!) 145/92  Home regimen includes HCTZ at 12.5 and losartan at 100 mg, blood pressure is well-controlled, will continue both meds, kidney function is within normal limits.

## 2023-07-14 NOTE — Assessment & Plan Note (Addendum)
Attribute patient's disorientation to her current UTI and sepsis presentation.  Chart reviewed patient's had MRI in the past x 2 that has mention ventriculomegaly and possible NPH.  Will proceed with an MRI of the brain as well and follow.

## 2023-07-15 ENCOUNTER — Inpatient Hospital Stay: Payer: Medicare Other

## 2023-07-15 ENCOUNTER — Encounter: Payer: Self-pay | Admitting: Internal Medicine

## 2023-07-15 ENCOUNTER — Other Ambulatory Visit: Payer: Medicare Other

## 2023-07-15 DIAGNOSIS — R410 Disorientation, unspecified: Secondary | ICD-10-CM

## 2023-07-15 LAB — PROTIME-INR
INR: 1.1 (ref 0.8–1.2)
Prothrombin Time: 14.3 s (ref 11.4–15.2)

## 2023-07-15 LAB — HEMOGLOBIN A1C
Hgb A1c MFr Bld: 6.4 % — ABNORMAL HIGH (ref 4.8–5.6)
Mean Plasma Glucose: 136.98 mg/dL

## 2023-07-15 LAB — PROCALCITONIN: Procalcitonin: 0.85 ng/mL

## 2023-07-15 LAB — CORTISOL-AM, BLOOD: Cortisol - AM: 19.2 ug/dL (ref 6.7–22.6)

## 2023-07-15 MED ORDER — LACTATED RINGERS IV SOLN: INTRAVENOUS | Status: AC

## 2023-07-15 NOTE — Progress Notes (Addendum)
PROGRESS NOTE  Margaret Francis YQM:[id number] DOB: [birth year] DOA: 07/14/2023 PCP: Smiley Houseman, NP  HPI/Recap of past 24 hours: Margaret Francis is a 89 y.o. female with medical history significant for dementia, ambulatory dysfunction, bilateral foot drop, frequent falls, hypertension, hypothyroidism, GERD, depression, who presents with altered mental status.  Workup revealed UA positive for pyuria, concern for NPH seen on brain MRI.  Discussed with neurosurgery, Dr. Alta Corning, will arrange for outpatient follow-up.  07/15/2023: The patient was seen and examined at bedside in the ER.  Still confused this morning, alert and oriented x 1.  She has no new complaints.  Assessment/Plan: Principal Problem:   AMS (altered mental status) Active Problems:   Sepsis secondary to UTI (HCC)   Hypertension   History of stroke   Severe sepsis (HCC)   Hypothyroidism   Dementia without behavioral disturbance (HCC)  Sepsis secondary to UTI, POA Presented with leukocytosis and lactic acidosis.  UA positive for pyuria. Continue to follow urine culture and peripheral blood cultures x 2 for ID and sensitivities. Currently on Rocephin empirically. Continue IV fluid hydration. Monitor fever curve and WBCs  Acute metabolic encephalopathy, suspect multifactorial, related to UTI versus NPH Continue to treat underlying conditions Reorient as needed Fall precautions.  History of CVA MRI with concern for NPH No acute ischemic or hemorrhagic infarcts Will follow-up with neurosurgery outpatient. Continue home aspirin Continue fall precautions  Hypothyroidism Continue home thyroxine  Chronic anxiety/depression Continue home regimen.  Generalized weakness with frequent falls PT OT assessment Fall precautions.   Time: 65 minutes.   Code Status: DNR  Family Communication: None at bedside.  Disposition Plan: Will benefit from another day of IV antibiotics, continue to monitor  cultures.   Consultants: Curbsided with neurosurgery.  Procedures: None.  Antimicrobials: Rocephin  DVT prophylaxis: SQ heparin 3 times daily  Status is: Inpatient The patient requires at least 2 midnights of evaluation and treatment of present condition.    Objective: Vitals:   07/15/23 1100 07/15/23 1130 07/15/23 1145 07/15/23 1156  BP: (!) 148/75 (!) 106/50    Pulse: 81 69 67   Resp: 19 19 18    Temp:    98.4 F (36.9 C)  TempSrc:    Axillary  SpO2: 97% 95% 94%   Weight:      Height:        Intake/Output Summary (Last 24 hours) at 07/15/2023 1158 Last data filed at 07/15/2023 0308 Gross per 24 hour  Intake 1548.41 ml  Output --  Net 1548.41 ml   Filed Weights   07/14/23 1904  Weight: 70.3 kg    Exam:  General: 89 y.o. year-old female well developed well nourished in no acute distress.  Alert and oriented x1. Cardiovascular: Regular rate and rhythm with no rubs or gallops.  No thyromegaly or JVD noted.   Respiratory: Clear to auscultation with no wheezes or rales. Good inspiratory effort. Abdomen: Soft nontender nondistended with normal bowel sounds x4 quadrants. Musculoskeletal: No lower extremity edema. 2/4 pulses in all 4 extremities. Skin: No ulcerative lesions noted or rashes, Psychiatry: Mood is appropriate for condition and setting   Data Reviewed: CBC: Recent Labs  Lab 07/14/23 1910  WBC 17.7*  NEUTROABS 16.4*  HGB 13.6  HCT 40.7  MCV 92.7  PLT 223   Basic Metabolic Panel: Recent Labs  Lab 07/14/23 1910  NA 134*  K 4.1  CL 99  CO2 23  GLUCOSE 188*  BUN 19  CREATININE 0.81  CALCIUM 10.7*  MG  1.8   GFR: Estimated Creatinine Clearance: 44 mL/min (by C-G formula based on SCr of 0.81 mg/dL). Liver Function Tests: Recent Labs  Lab 07/14/23 1910  AST 40  ALT 29  ALKPHOS 111  BILITOT 0.7  PROT 7.5  ALBUMIN 4.0   No results for input(s): "LIPASE", "AMYLASE" in the last 168 hours. No results for input(s): "AMMONIA" in the  last 168 hours. Coagulation Profile: Recent Labs  Lab 07/15/23 0557  INR 1.1   Cardiac Enzymes: No results for input(s): "CKTOTAL", "CKMB", "CKMBINDEX", "TROPONINI" in the last 168 hours. BNP (last 3 results) No results for input(s): "PROBNP" in the last 8760 hours. HbA1C: Recent Labs    07/14/23 1910  HGBA1C 6.4*   CBG: No results for input(s): "GLUCAP" in the last 168 hours. Lipid Profile: No results for input(s): "CHOL", "HDL", "LDLCALC", "TRIG", "CHOLHDL", "LDLDIRECT" in the last 72 hours. Thyroid Function Tests: Recent Labs    07/14/23 1910  TSH 2.241  FREET4 1.03   Anemia Panel: No results for input(s): "VITAMINB12", "FOLATE", "FERRITIN", "TIBC", "IRON", "RETICCTPCT" in the last 72 hours. Urine analysis:    Component Value Date/Time   COLORURINE YELLOW (A) 07/14/2023 2048   APPEARANCEUR HAZY (A) 07/14/2023 2048   LABSPEC 1.016 07/14/2023 2048   PHURINE 5.0 07/14/2023 2048   GLUCOSEU NEGATIVE 07/14/2023 2048   HGBUR MODERATE (A) 07/14/2023 2048   BILIRUBINUR NEGATIVE 07/14/2023 2048   BILIRUBINUR neg 04/19/2011 1221   KETONESUR 5 (A) 07/14/2023 2048   PROTEINUR NEGATIVE 07/14/2023 2048   UROBILINOGEN neg 04/19/2011 1221   NITRITE NEGATIVE 07/14/2023 2048   LEUKOCYTESUR MODERATE (A) 07/14/2023 2048   Sepsis Labs: @LABRCNTIP (procalcitonin:4,lacticidven:4)  ) Recent Results (from the past 240 hour(s))  Urine Culture     Status: Abnormal   Collection Time: 07/10/23  5:01 PM   Specimen: Urine, Catheterized  Result Value Ref Range Status   Specimen Description   Final    URINE, CATHETERIZED Performed at [facility], [address] [place], [place] [address]    Special Requests   Final    NONE Performed at [facility], [address], [place], [place] [address]    Culture >=100,000 COLONIES/mL KLEBSIELLA AEROGENES (A)  Final   Report Status 07/13/2023 FINAL  Final   Organism ID, Bacteria KLEBSIELLA AEROGENES (A)  Final       Susceptibility   Klebsiella aerogenes - MIC*    CEFEPIME <=0.12 SENSITIVE Sensitive     CEFTRIAXONE <=0.25 SENSITIVE Sensitive     CIPROFLOXACIN <=0.25 SENSITIVE Sensitive     GENTAMICIN <=1 SENSITIVE Sensitive     IMIPENEM 2 SENSITIVE Sensitive     NITROFURANTOIN 128 RESISTANT Resistant     TRIMETH/SULFA <=20 SENSITIVE Sensitive     PIP/TAZO <=4 SENSITIVE Sensitive ug/mL    * >=100,000 COLONIES/mL KLEBSIELLA AEROGENES  Blood Culture (routine x 2)     Status: None (Preliminary result)   Collection Time: 07/14/23  7:10 PM   Specimen: BLOOD  Result Value Ref Range Status   Specimen Description BLOOD BLOOD LEFT ARM  Final   Special Requests   Final    BOTTLES DRAWN AEROBIC AND ANAEROBIC Blood Culture results may not be optimal due to an excessive volume of blood received in culture bottles   Culture   Final    NO GROWTH < 12 HOURS Performed at [facility], [address], [place], [place] [address]    Report Status PENDING  Incomplete  Blood Culture (routine x 2)  Status: None (Preliminary result)   Collection Time: 07/14/23  7:10 PM   Specimen: BLOOD  Result Value Ref Range Status   Specimen Description BLOOD BLOOD RIGHT ARM  Final   Special Requests   Final    BOTTLES DRAWN AEROBIC AND ANAEROBIC Blood Culture results may not be optimal due to an inadequate volume of blood received in culture bottles   Culture   Final    NO GROWTH < 12 HOURS Performed at [facility], [address], [place], [place] [address]    Report Status PENDING  Incomplete      Studies: MR BRAIN WO CONTRAST  Result Date: 07/15/2023 CLINICAL DATA:  Syncope/presyncope, concern for NPH, history of dementia EXAM: MRI HEAD WITHOUT CONTRAST TECHNIQUE: Multiplanar, multiecho pulse sequences of the brain and surrounding structures were obtained without intravenous contrast. COMPARISON:  07/10/2023 CT head, 06/26/2020 MRI head FINDINGS: Brain: No restricted diffusion to suggest acute or  subacute infarct. No acute hemorrhage, mass, mass effect, midline shift, or extra-axial collection. Pituitary and craniocervical junction within normal limits. Redemonstrated ventriculomegaly, with acute callosal angle, crowding of the sulci near the vertex, and thinning of the corpus callosum, with T2 hyperintense signal around the ventricles that could represent transependymal flow of CSF and/or chronic small vessel ischemic disease. Vascular: Normal arterial flow voids. Skull and upper cervical spine: Normal marrow signal. Sinuses/Orbits: Mucosal thickening in the right maxillary sinus and ethmoid air cells. Status post bilateral lens replacements. No acute finding in the orbits. Other: Fluid in the right mastoid air cells. IMPRESSION: 1. No acute intracranial process. 2. Redemonstrated ventriculomegaly, with acute callosal angle, crowding of the sulci near the vertex, thinning of the corpus callosum, and possible transependymal flow of CSF, all of which remain concerning for normal pressure hydrocephalus. Electronically Signed   By: Wiliam Ke M.D.   On: 07/15/2023 02:43   DG Abd Portable 1V  Result Date: 07/15/2023 CLINICAL DATA:  Syncope/presyncope EXAM: PORTABLE ABDOMEN - 1 VIEW COMPARISON:  None Available. FINDINGS: Cholecystectomy clips are present. The bowel gas pattern is normal. There is a large amount of stool in the rectum. No radio-opaque calculi. The bones are osteopenic. There are degenerative changes of both shoulders. IMPRESSION: Nonobstructive bowel gas pattern. Large amount of stool in the rectum. Electronically Signed   By: Darliss Cheney M.D.   On: 07/15/2023 00:36   DG Chest Port 1 View  Result Date: 07/14/2023 CLINICAL DATA:  Questionable sepsis. Poor historian. Evaluate for abnormality. EXAM: PORTABLE CHEST 1 VIEW COMPARISON:  05/08/2023 FINDINGS: Stable cardiomediastinal silhouette. Aortic atherosclerotic calcification. Low lung volumes. Left basilar atelectasis or infiltrates.  Otherwise no focal consolidation, pleural effusion, or pneumothorax. Remote right rib fractures. IMPRESSION: Low lung volumes with left basilar atelectasis or infiltrates. Electronically Signed   By: Minerva Fester M.D.   On: 07/14/2023 19:38    Scheduled Meds:  aspirin EC  81 mg Oral Daily   heparin  5,000 Units Subcutaneous Q8H   levothyroxine  50 mcg Oral Q0600   mirabegron ER  50 mg Oral Daily   mirtazapine  7.5 mg Oral QHS   sertraline  50 mg Oral QHS    Continuous Infusions:  cefTRIAXone (ROCEPHIN)  IV     lactated ringers 100 mL/hr at 07/15/23 0546     LOS: 1 day     Darlin Drop, MD [facility] Pager [phone]  If 7PM-7AM, please contact night-coverage [email or web address] Password [facility] 07/15/2023, 11:58 AM

## 2023-07-15 NOTE — ED Notes (Signed)
Pt back from MRI. Place on PureWick after approval by Aundra Millet, RN, charge nurse.

## 2023-07-15 NOTE — ED Notes (Signed)
Call made to assigned unit due to delay in transport >1 hour due to no handoff completion. Unit secretary response of shift change and unknown new assigned RN. Will locate and instruct unit RN to complete once assigned.

## 2023-07-15 NOTE — ED Notes (Signed)
Pt more alert and now oriented to person as well as place stating she is "in the hospital." She denies any pain at this time. Pt in SR on monitor without ectopy noted at thist ime. Lungs CTA bilat and O2  sat is mid to upper 90s on RA as noted. VSS. Continue to monitor.

## 2023-07-15 NOTE — ED Notes (Signed)
Pt remains in MRI

## 2023-07-15 NOTE — ED Notes (Signed)
Patient transported to MRI 

## 2023-07-15 NOTE — ED Notes (Signed)
Called IC about Purple man at this time. Secretary stated she would get someone who could help.

## 2023-07-15 NOTE — Progress Notes (Signed)
Contacted by ED provider for evaluation of 89 yo with possible NPH on MRI imaging.  Reviewed MRI which demonstrated ventriculomegaly versus hydrocephalus ex-vacuo.  Importantly, stable from prior imaging with acute process.  Recommend outpatient referral to neurology/neurosurgery for discussion of NPH.  Due to advanced age, surgical intervention would be high risk and likely not a candidate for intervention

## 2023-07-15 NOTE — ED Notes (Addendum)
Per family member pt stated earlier that she needed to urinated. Pt voided but family member noted that no urine was collected in the suction container from purewick. Upon assessment pt was wet. This RN and RN Melissa provided new linen change, chux pad, and brief placed. Purewick placed. Pt resting comfortably with new warm blankets provided. Pt is A&O. Family at bedside.

## 2023-07-16 DIAGNOSIS — D696 Thrombocytopenia, unspecified: Secondary | ICD-10-CM | POA: Diagnosis not present

## 2023-07-16 DIAGNOSIS — R410 Disorientation, unspecified: Secondary | ICD-10-CM | POA: Diagnosis not present

## 2023-07-16 DIAGNOSIS — I639 Cerebral infarction, unspecified: Secondary | ICD-10-CM | POA: Diagnosis not present

## 2023-07-16 LAB — CBC
HCT: 34.8 % — ABNORMAL LOW (ref 36.0–46.0)
Hemoglobin: 11.9 g/dL — ABNORMAL LOW (ref 12.0–15.0)
MCH: 30.6 pg (ref 26.0–34.0)
MCHC: 34.2 g/dL (ref 30.0–36.0)
MCV: 89.5 fL (ref 80.0–100.0)
Platelets: 189 10*3/uL (ref 150–400)
RBC: 3.89 MIL/uL (ref 3.87–5.11)
RDW: 13.7 % (ref 11.5–15.5)
WBC: 8.8 10*3/uL (ref 4.0–10.5)
nRBC: 0 % (ref 0.0–0.2)

## 2023-07-16 LAB — PHOSPHORUS: Phosphorus: 1.9 mg/dL — ABNORMAL LOW (ref 2.5–4.6)

## 2023-07-16 LAB — URINE CULTURE: Culture: <10000 — AB

## 2023-07-16 LAB — BASIC METABOLIC PANEL
Anion gap: 9 (ref 5–15)
BUN: 18 mg/dL (ref 8–23)
CO2: 23 mmol/L (ref 22–32)
Calcium: 10.2 mg/dL (ref 8.9–10.3)
Chloride: 102 mmol/L (ref 98–111)
Creatinine, Ser: 0.71 mg/dL (ref 0.44–1.00)
GFR, Estimated: >60 mL/min (ref >60)
Glucose, Bld: 103 mg/dL — ABNORMAL HIGH (ref 70–99)
Potassium: 3.3 mmol/L — ABNORMAL LOW (ref 3.5–5.1)
Sodium: 134 mmol/L — ABNORMAL LOW (ref 135–145)

## 2023-07-16 LAB — MAGNESIUM: Magnesium: 1.9 mg/dL (ref 1.7–2.4)

## 2023-07-16 LAB — LACTIC ACID, PLASMA: Lactic Acid, Venous: 1 mmol/L (ref 0.5–1.9)

## 2023-07-16 LAB — GLUCOSE, CAPILLARY: Glucose-Capillary: 111 mg/dL — ABNORMAL HIGH (ref 70–99)

## 2023-07-16 MED ORDER — POTASSIUM CHLORIDE CRYS ER 20 MEQ PO TBCR
20.0000 meq | EXTENDED_RELEASE_TABLET | Freq: Two times a day (BID) | ORAL | Status: AC
  Administered 2023-07-16 – 2023-07-17 (×2): 20 meq via ORAL
  Filled 2023-07-16 (×2): qty 1

## 2023-07-16 NOTE — Evaluation (Signed)
Occupational Therapy Evaluation Patient Details Name: Margaret Francis MRN: [id number] DOB: 10/17/1929 Today's Date: 07/16/2023   History of Present Illness Margaret Francis is a 89 y.o. female with medical history significant for allergies to Demerol morphine and codeine, dementia, ambulatory dysfunction, bilateral foot drop, frequent falls, hypertension, hypothyroid, GERD, depression, comes in today for confusion and altered mental status. Pt found to be + for UTI, admitted for further management.   Clinical Impression   Margaret Francis was seen for OT evaluation this date. Prior to hospital admission, pt was residing at an ALF, with a caregiver checking on her daily. Pt pt/caregiver pt requires assistance for bathing, dressing, and transfers. She is intermittently able to stand and take a few steps to her [facility], but lately has required max/total assist to shift from her recliner to her WC at home. Pt presents to acute OT demonstrating impaired ADL performance and functional mobility 2/2 generalized weakness, decreased balance, & decreased activity tolerance (See OT problem list for additional functional deficits). Pt currently requires MAX A +2 for bed mobility and to squat pivot to a recliner, MOD A For STS attempts x2. Pt fatigues very quickly with minimal activity and would benefit from skilled OT services to address noted impairments and functional limitations (see below for any additional details) in order to maximize safety and independence while minimizing falls risk and caregiver burden. Anticipate the need for follow up OT services upon acute hospital DC.        If plan is discharge home, recommend the following: Two people to help with walking and/or transfers;Two people to help with bathing/dressing/bathroom    Functional Status Assessment  Patient has had a recent decline in their functional status and demonstrates the ability to make significant improvements in function in a reasonable and  predictable amount of time.  Equipment Recommendations  Other (comment) (defer to next venue of care)    Recommendations for Other Services       Precautions / Restrictions Precautions Precautions: Fall Precaution Comments: Hx of LLE foot drop Restrictions Weight Bearing Restrictions: No      Mobility Bed Mobility Overal bed mobility: Needs Assistance Bed Mobility: Supine to Sit     Supine to sit: Max assist, +2 for physical assistance          Transfers Overall transfer level: Needs assistance Equipment used: Rolling walker (2 wheels) Transfers: Sit to/from Stand, Bed to chair/wheelchair/BSC Sit to Stand: Mod assist, +2 physical assistance   Squat pivot transfers: Max assist, +2 physical assistance              Balance Overall balance assessment: Needs assistance Sitting-balance support: No upper extremity supported, Feet supported, Single extremity supported Sitting balance-Leahy Scale: Fair Sitting balance - Comments: Fatigues quickly while seated EOB, tends to lean onto RUE to maintain sitting balance.   Standing balance support: Reliant on assistive device for balance Standing balance-Leahy Scale: Poor Standing balance comment: Requires support to mainatin standing balance at EOB.                           ADL either performed or assessed with clinical judgement   ADL Overall ADL's : Needs assistance/impaired                                     Functional mobility during ADLs: Moderate assistance;Maximal assistance;+2 for physical assistance General  ADL Comments: Pt functionally limited by generalized weakness, decreased activity tolerance. She requirs +2 MAX A to come to sit at EOB, +2 MOD A for STS t/fs x2. Pt is unable to take steps upon standing, requires +2 MAX A to squat pivot to recliner. Anticipate MAX A with +2 for STS for LB ADL management, toilet transfer, and toileting.     Vision Patient Visual Report: No change  from baseline       Perception         Praxis         Pertinent Vitals/Pain Pain Assessment Pain Assessment: No/denies pain     Extremity/Trunk Assessment Upper Extremity Assessment Upper Extremity Assessment: Generalized weakness   Lower Extremity Assessment Lower Extremity Assessment: Generalized weakness;LLE deficits/detail;Defer to PT evaluation LLE Deficits / Details: Hx of LLE foot drop/weakness       Communication Communication Cueing Techniques: Verbal cues;Gestural cues;Tactile cues   Cognition Arousal: Alert Behavior During Therapy: WFL for tasks assessed/performed Overall Cognitive Status: Within Functional Limits for tasks assessed                                 General Comments: Pt oriented to self and limited plae (able to state she is in the hospital but does not know city. Follows VCs farily consistently during session with occasional additional cueing.     General Comments       Exercises Other Exercises Other Exercises: PT/caregiver educated on role of OT in acute setting, safet transfer technique, and DC recs this date.   Shoulder Instructions      Home Living Family/patient expects to be discharged to:: Assisted living Living Arrangements: Alone Available Help at Discharge: Friend(s);Available PRN/intermittently (Caregiver, heather checks on pt daily.)         Home Layout: One level     Bathroom Shower/Tub: Walk-in shower         Home Equipment: Agricultural consultant (2 wheels);Wheelchair - manual   Additional Comments: Pt lives at [facility], per caregiver pt is planning to transition to LTC 2/2 increased level of assistance required over last several months.      Prior Functioning/Environment Prior Level of Function : Needs assist       Physical Assist : ADLs (physical);Mobility (physical) Mobility (physical): Transfers;Bed mobility ADLs (physical): Bathing;Dressing;Toileting;IADLs Mobility Comments: Generally  using MWC for functional mobility. Stand pivots with assist to [facility] on good days. Other days pt requires assist to slide over to [facility] from recliner.  Pt reports mobility has been more limited lately. ADLs Comments: Pt states Caregiver assists with bathing, dressing, and IADL management including meal prep. Pt sleeps in recliner. Has required increased assistance over last several months.        OT Problem List: Decreased strength;Decreased cognition;Decreased activity tolerance;Decreased knowledge of use of DME or AE;Impaired balance (sitting and/or standing);Decreased coordination      OT Treatment/Interventions: Self-care/ADL training;Therapeutic exercise;Cognitive remediation/compensation;Energy conservation;Patient/family education;DME and/or AE instruction;Balance training    OT Goals(Current goals can be found in the care plan section) Acute Rehab OT Goals Patient Stated Goal: To feel better OT Goal Formulation: With patient Time For Goal Achievement: 07/30/23 Potential to Achieve Goals: Good ADL Goals Pt Will Perform Grooming: sitting;with set-up;with supervision Pt Will Perform Lower Body Dressing: sitting/lateral leans;sit to/from stand;with mod assist;with caregiver independent in assisting;with adaptive equipment Pt Will Transfer to Toilet: bedside commode;stand pivot transfer;squat pivot transfer;with mod assist Pt Will Perform Toileting -  Clothing Manipulation and hygiene: with mod assist;with adaptive equipment;with caregiver independent in assisting;sitting/lateral leans  OT Frequency: Min 1X/week    Co-evaluation PT/OT/SLP Co-Evaluation/Treatment: Yes Reason for Co-Treatment: For patient/therapist safety;To address functional/ADL transfers PT goals addressed during session: Mobility/safety with mobility;Balance;Proper use of DME OT goals addressed during session: ADL's and self-care;Proper use of Adaptive equipment and DME      AM-PAC OT "6 Clicks" Daily Activity      Outcome Measure Help from another person eating meals?: A Little Help from another person taking care of personal grooming?: A Little Help from another person toileting, which includes using toliet, bedpan, or urinal?: Total Help from another person bathing (including washing, rinsing, drying)?: A Lot Help from another person to put on and taking off regular upper body clothing?: A Little Help from another person to put on and taking off regular lower body clothing?: Total 6 Click Score: 13   End of Session Equipment Utilized During Treatment: Gait belt;Rolling walker (2 wheels)  Activity Tolerance: Patient tolerated treatment well Patient left: in chair;with call bell/phone within reach;with chair alarm set;with family/visitor present  OT Visit Diagnosis: Other abnormalities of gait and mobility (R26.89);Muscle weakness (generalized) (M62.81)                Time: 1610-9604 OT Time Calculation (min): 31 min Charges:  OT General Charges $OT Visit: 1 Visit OT Evaluation $OT Eval Moderate Complexity: 1 Mod OT Treatments $Self Care/Home Management : 8-22 mins  Rockney Ghee, M.S., OTR/L 07/16/23, 12:48 PM

## 2023-07-16 NOTE — [place] FL2 (Signed)
[facility] MEDICAID FL2 LEVEL OF CARE FORM     IDENTIFICATION  Patient Name: Margaret Francis Birthdate: September 14, 1929 Sex: female Admission Date (Current Location): 07/14/2023  [facility] and [place] Number:  Chiropodist and Address:  [facility], [address] [place], [place] [address]      Provider Number: [id number]  Attending Physician Name and Address:  Darlin Drop, DO  Relative Name and Phone Number:       Current Level of Care: Hospital Recommended Level of Care: Skilled Nursing Facility Prior Approval Number:    Date Approved/Denied:   PASRR Number: [id number]  Discharge Plan: SNF    Current Diagnoses: Patient Active Problem List   Diagnosis Date Noted   Dementia without behavioral disturbance (HCC) 07/14/2023   AMS (altered mental status) 07/14/2023   Severe sepsis (HCC) 05/08/2023   Weakness 05/08/2023   Hypothyroidism 05/08/2023   Vulvovaginal candidiasis 05/08/2023   Pyuria 05/08/2023   Vulvar pruritus 01/19/2023   Overweight (BMI 25.0-29.9) 01/04/2022   Sepsis secondary to UTI (HCC) 01/02/2022   Hypertension    History of stroke    Intestinal infection, enteritis due to rotavirus    Transaminitis    Frequent falls    Peripheral neuropathy 11/29/2020   Cerebral ventriculomegaly 11/29/2020   Bilateral foot-drop 07/28/2020   Abnormality of gait 03/20/2013   Urinary frequency 04/19/2011    Orientation RESPIRATION BLADDER Height & Weight        Normal Incontinent Weight: 184 lb 11.9 oz (83.8 kg) Height:  5\' 7"  (170.2 cm)  BEHAVIORAL SYMPTOMS/MOOD NEUROLOGICAL BOWEL NUTRITION STATUS      Continent    AMBULATORY STATUS COMMUNICATION OF NEEDS Skin   Extensive Assist Verbally Normal                       Personal Care Assistance Level of Assistance  Bathing, Dressing, Feeding Bathing Assistance: Maximum assistance Feeding assistance: Maximum assistance Dressing Assistance: Maximum assistance      Functional Limitations Info             SPECIAL CARE FACTORS FREQUENCY  OT (By licensed OT), PT (By licensed PT)     PT Frequency: 5 times a week OT Frequency: 5 times a week            Contractures Contractures Info: Not present    Additional Factors Info  Code Status, Allergies Code Status Info: DNR-limited Allergies Info: Demerol  Morphine And Codeine           Current Medications (07/16/2023):  This is the current hospital active medication list Current Facility-Administered Medications  Medication Dose Route Frequency Provider Last Rate Last Admin   acetaminophen (TYLENOL) tablet 650 mg  650 mg Oral Q6H PRN Gertha Calkin, MD       Or   acetaminophen (TYLENOL) suppository 650 mg  650 mg Rectal Q6H PRN Gertha Calkin, MD   650 mg at 07/14/23 2335   aspirin EC tablet 81 mg  81 mg Oral Daily Irena Cords V, MD   81 mg at 07/16/23 1150   cefTRIAXone (ROCEPHIN) 2 g in sodium chloride 0.9 % 100 mL IVPB  2 g Intravenous Q24H Irena Cords V, MD 200 mL/hr at 07/15/23 2102 2 g at 07/15/23 2102   heparin injection 5,000 Units  5,000 Units Subcutaneous Q8H Gertha Calkin, MD   5,000 Units at 07/16/23 1410   levothyroxine (SYNTHROID) tablet 50 mcg  50 mcg Oral Q0600 Allena Katz,  Eliezer Mccoy, MD   50 mcg at 07/16/23 0532   mirabegron ER (MYRBETRIQ) tablet 50 mg  50 mg Oral Daily Irena Cords V, MD   50 mg at 07/16/23 1150   mirtazapine (REMERON) tablet 7.5 mg  7.5 mg Oral QHS Irena Cords V, MD   7.5 mg at 07/15/23 2229   ondansetron (ZOFRAN) tablet 4 mg  4 mg Oral Q6H PRN Gertha Calkin, MD       Or   ondansetron [facility]) injection 4 mg  4 mg Intravenous Q6H PRN Gertha Calkin, MD       sertraline (ZOLOFT) tablet 50 mg  50 mg Oral QHS Gertha Calkin, MD   50 mg at 07/15/23 2229     Discharge Medications: Please see discharge summary for a list of discharge medications.  Relevant Imaging Results:  Relevant Lab Results:   Additional Information SSN:[id number]  Allena Katz,  LCSW

## 2023-07-16 NOTE — TOC Initial Note (Signed)
Transition of Care [facility]) - Initial/Assessment Note    Patient Details  Name: Margaret Francis MRN: [id number] Date of Birth: 07-31-1930  Transition of [facility]) CM/SW Contact:    Allena Katz, LCSW Phone Number: 07/16/2023, 11:39 AM  Clinical Narrative:   Pt admitted from [facility] with a fall and AMS. Pt has dementia and is currently only AXO1. CSW consulted due to caregiver wanting patient to be placed at [facility]. CSW is waiting for PT/OT to evaluate and will contact family for further workup.             Expected Discharge Plan: Skilled Nursing Facility Barriers to Discharge: Continued Medical Work up   Patient Goals and CMS Choice            Expected Discharge Plan and Services                                              Prior Living Arrangements/Services   Lives with:: Facility Resident Patient language and need for interpreter reviewed:: Yes          Care giver support system in place?: Yes (comment)      Activities of Daily Living   ADL Screening (condition at time of admission) Independently performs ADLs?: No Does the patient have a NEW difficulty with bathing/dressing/toileting/self-feeding that is expected to last >3 days?: No Does the patient have a NEW difficulty with getting in/out of bed, walking, or climbing stairs that is expected to last >3 days?: No Does the patient have a NEW difficulty with communication that is expected to last >3 days?: No Is the patient deaf or have difficulty hearing?: Yes Does the patient have difficulty seeing, even when wearing glasses/contacts?: No Does the patient have difficulty concentrating, remembering, or making decisions?: No  Permission Sought/Granted                  Emotional Assessment       Orientation: : Oriented to Self      Admission diagnosis:  Lower urinary tract infectious disease [N39.0] Altered mental status, unspecified altered mental status type [R41.82] AMS  (altered mental status) [R41.82] Patient Active Problem List   Diagnosis Date Noted   Dementia without behavioral disturbance (HCC) 07/14/2023   AMS (altered mental status) 07/14/2023   Severe sepsis (HCC) 05/08/2023   Weakness 05/08/2023   Hypothyroidism 05/08/2023   Vulvovaginal candidiasis 05/08/2023   Pyuria 05/08/2023   Vulvar pruritus 01/19/2023   Overweight (BMI 25.0-29.9) 01/04/2022   Sepsis secondary to UTI (HCC) 01/02/2022   Hypertension    History of stroke    Intestinal infection, enteritis due to rotavirus    Transaminitis    Frequent falls    Peripheral neuropathy 11/29/2020   Cerebral ventriculomegaly 11/29/2020   Bilateral foot-drop 07/28/2020   Abnormality of gait 03/20/2013   Urinary frequency 04/19/2011   PCP:  Smiley Houseman, NP Pharmacy:   [facility] [place], [place] - [address] [place] [address] Phone: [phone] Fax: [phone]  [facility] [place], [place] - [address] [place] [address] Phone: [phone] Fax: [phone]  [id number] - [place], [place] - [address] AT [facility] [address] [place] [address] Phone: [phone] Fax: [phone]     Social Determinants of Health (SDOH) Social  History: SDOH Screenings   Food Insecurity: No Food Insecurity (07/16/2023)  Housing: Low Risk  (07/15/2023)  Transportation Needs: No Transportation Needs (07/15/2023)  Utilities: Not At Risk (07/15/2023)  Tobacco Use: Low Risk  (07/14/2023)   SDOH Interventions:     Readmission Risk Interventions     No data to display

## 2023-07-16 NOTE — Progress Notes (Signed)
PROGRESS NOTE  Margaret Francis VHQ:[id number] DOB: 1929-12-09 DOA: 07/14/2023 PCP: Smiley Houseman, NP  HPI/Recap of past 24 hours: Margaret Francis is a 89 y.o. female with medical history significant for dementia, ambulatory dysfunction, bilateral foot drop, frequent falls, hypertension, hypothyroidism, GERD, depression, recent Klebsiella aeruginosa's UTI (07/10/23) who presents with altered mental status.  Workup revealed presumptive UTI with UA positive for pyuria, concern for possible NPH with ventriculomegaly seen on MRI.  Discussed with neurosurgery, Dr. Alta Corning, will arrange for outpatient follow-up.  Seen by PT OT recommended rehab.  TOC assisting with disposition.  07/16/2023: The patient was seen and examined at bedside.  Somnolent but easily arousable to voices.  No new complaints this morning.  Assessment/Plan: Principal Problem:   AMS (altered mental status) Active Problems:   Sepsis secondary to UTI (HCC)   Hypertension   History of stroke   Severe sepsis (HCC)   Hypothyroidism   Dementia without behavioral disturbance (HCC)  Sepsis secondary to presumed UTI, POA Presented with leukocytosis and lactic acidosis.  UA positive for pyuria. Urine culture grew less than 10,000 colonies. recent Klebsiella aeruginosa's UTI (07/10/23)  Currently on Rocephin empirically. Monitor fever curve and WBCs  Acute metabolic encephalopathy, suspect multifactorial, related to presumptive UTI versus possible NPH Continue to treat underlying conditions Reorient as needed Fall precautions.  Generalized weakness Seen by PT OT with recommendation for rehab [facility] assisting with placement. Continue fall precautions  Electrolytes abnormalities Hypokalemia Hypophosphatemia Repleted intravenously.  History of CVA MRI with ventriculomegaly, concern for NPH No acute ischemic or hemorrhagic infarcts Will follow-up with neurosurgery outpatient. Continue home aspirin for secondary CVA  prevention Continue fall precautions  Hypothyroidism Continue home thyroxine  Chronic anxiety/depression Continue home regimen.   Time: 55 minutes.   Code Status: DNR  Family Communication: Updated the caregiver at bedside.  Disposition Plan: Awaiting placement to SNF.  Consultants: Curbsided with neurosurgery.  Procedures: None.  Antimicrobials: Rocephin  DVT prophylaxis: SQ heparin 3 times daily  Status is: Inpatient The patient requires at least 2 midnights of evaluation and treatment of present condition.    Objective: Vitals:   07/15/23 2035 07/16/23 0044 07/16/23 0415 07/16/23 0751  BP: 138/83 (!) 157/76 (!) 142/88 122/67  Pulse: 80 69 89 64  Resp:    14  Temp: 97.8 F (36.6 C) 97.6 F (36.4 C) 98.7 F (37.1 C) 98.8 F (37.1 C)  TempSrc:   Oral   SpO2: 96% 94% 97% 98%  Weight: 83.8 kg     Height: 5\' 7"  (1.702 m)       Intake/Output Summary (Last 24 hours) at 07/16/2023 1553 Last data filed at 07/16/2023 1524 Gross per 24 hour  Intake 125.04 ml  Output 400 ml  Net -274.96 ml   Filed Weights   07/14/23 1904 07/15/23 2010 07/15/23 2035  Weight: 70.3 kg 85.9 kg 83.8 kg    Exam:  General: 89 y.o. year-old female frail-appearing s.  She is somnolent but easily arousable to voices. Cardiovascular: Regular rate and rhythm no rubs or gallops.   Respiratory: Clear to auscultation with no wheezes or rales.   Abdomen: Soft nontender with normal bowel sounds present.. Musculoskeletal: No lower extremity edema. 2/4 pulses in all 4 extremities. Skin: No ulcerative lesions noted or rashes, Psychiatry: Mood is appropriate for condition setting.   Data Reviewed: CBC: Recent Labs  Lab 07/14/23 1910 07/16/23 0614  WBC 17.7* 8.8  NEUTROABS 16.4*  --   HGB 13.6 11.9*  HCT 40.7 34.8*  MCV 92.7 89.5  PLT 223 189   Basic Metabolic Panel: Recent Labs  Lab 07/14/23 1910 07/16/23 0614  NA 134* 134*  K 4.1 3.3*  CL 99 102  CO2 23 23  GLUCOSE  188* 103*  BUN 19 18  CREATININE 0.81 0.71  CALCIUM 10.7* 10.2  MG 1.8 1.9  PHOS  --  1.9*   GFR: Estimated Creatinine Clearance: 51 mL/min (by C-G formula based on SCr of 0.71 mg/dL). Liver Function Tests: Recent Labs  Lab 07/14/23 1910  AST 40  ALT 29  ALKPHOS 111  BILITOT 0.7  PROT 7.5  ALBUMIN 4.0   No results for input(s): "LIPASE", "AMYLASE" in the last 168 hours. No results for input(s): "AMMONIA" in the last 168 hours. Coagulation Profile: Recent Labs  Lab 07/15/23 0557  INR 1.1   Cardiac Enzymes: No results for input(s): "CKTOTAL", "CKMB", "CKMBINDEX", "TROPONINI" in the last 168 hours. BNP (last 3 results) No results for input(s): "PROBNP" in the last 8760 hours. HbA1C: Recent Labs    07/14/23 1910  HGBA1C 6.4*   CBG: Recent Labs  Lab 07/16/23 1019  GLUCAP 111*   Lipid Profile: No results for input(s): "CHOL", "HDL", "LDLCALC", "TRIG", "CHOLHDL", "LDLDIRECT" in the last 72 hours. Thyroid Function Tests: Recent Labs    07/14/23 1910  TSH 2.241  FREET4 1.03   Anemia Panel: No results for input(s): "VITAMINB12", "FOLATE", "FERRITIN", "TIBC", "IRON", "RETICCTPCT" in the last 72 hours. Urine analysis:    Component Value Date/Time   COLORURINE YELLOW (A) 07/14/2023 2048   APPEARANCEUR HAZY (A) 07/14/2023 2048   LABSPEC 1.016 07/14/2023 2048   PHURINE 5.0 07/14/2023 2048   GLUCOSEU NEGATIVE 07/14/2023 2048   HGBUR MODERATE (A) 07/14/2023 2048   BILIRUBINUR NEGATIVE 07/14/2023 2048   BILIRUBINUR neg 04/19/2011 1221   KETONESUR 5 (A) 07/14/2023 2048   PROTEINUR NEGATIVE 07/14/2023 2048   UROBILINOGEN neg 04/19/2011 1221   NITRITE NEGATIVE 07/14/2023 2048   LEUKOCYTESUR MODERATE (A) 07/14/2023 2048   Sepsis Labs: @LABRCNTIP (procalcitonin:4,lacticidven:4)  ) Recent Results (from the past 240 hour(s))  Urine Culture     Status: Abnormal   Collection Time: 07/10/23  5:01 PM   Specimen: Urine, Catheterized  Result Value Ref Range Status    Specimen Description   Final    URINE, CATHETERIZED Performed at [facility], [address] [place], [place] [address]    Special Requests   Final    NONE Performed at [facility], [address] [place], [place] [address]    Culture >=100,000 COLONIES/mL KLEBSIELLA AEROGENES (A)  Final   Report Status 07/13/2023 FINAL  Final   Organism ID, Bacteria KLEBSIELLA AEROGENES (A)  Final      Susceptibility   Klebsiella aerogenes - MIC*    CEFEPIME <=0.12 SENSITIVE Sensitive     CEFTRIAXONE <=0.25 SENSITIVE Sensitive     CIPROFLOXACIN <=0.25 SENSITIVE Sensitive     GENTAMICIN <=1 SENSITIVE Sensitive     IMIPENEM 2 SENSITIVE Sensitive     NITROFURANTOIN 128 RESISTANT Resistant     TRIMETH/SULFA <=20 SENSITIVE Sensitive     PIP/TAZO <=4 SENSITIVE Sensitive ug/mL    * >=100,000 COLONIES/mL KLEBSIELLA AEROGENES  Blood Culture (routine x 2)     Status: None (Preliminary result)   Collection Time: 07/14/23  7:10 PM   Specimen: BLOOD  Result Value Ref Range Status   Specimen Description BLOOD BLOOD LEFT ARM  Final   Special Requests   Final    BOTTLES DRAWN AEROBIC AND  ANAEROBIC Blood Culture results may not be optimal due to an excessive volume of blood received in culture bottles   Culture   Final    NO GROWTH 2 DAYS Performed at [facility], [address] [place], [place] [address]    Report Status PENDING  Incomplete  Blood Culture (routine x 2)     Status: None (Preliminary result)   Collection Time: 07/14/23  7:10 PM   Specimen: BLOOD  Result Value Ref Range Status   Specimen Description BLOOD BLOOD RIGHT ARM  Final   Special Requests   Final    BOTTLES DRAWN AEROBIC AND ANAEROBIC Blood Culture results may not be optimal due to an inadequate volume of blood received in culture bottles   Culture   Final    NO GROWTH 2 DAYS Performed at [facility], [address] [place], [place] [address]    Report Status PENDING  Incomplete   Urine Culture     Status: Abnormal   Collection Time: 07/14/23  8:48 PM   Specimen: Urine, Random  Result Value Ref Range Status   Specimen Description   Final    URINE, RANDOM Performed at [facility], [address], [place], [place] [address]    Special Requests   Final    NONE Reflexed from [id number] Performed at [facility], [address] [place], [place] [address]    Culture (A)  Final    <10,000 COLONIES/mL INSIGNIFICANT GROWTH Performed at [facility], [address] [place], [place] [address]    Report Status 07/16/2023 FINAL  Final      Studies: No results found.  Scheduled Meds:  aspirin EC  81 mg Oral Daily   heparin  5,000 Units Subcutaneous Q8H   levothyroxine  50 mcg Oral Q0600   mirabegron ER  50 mg Oral Daily   mirtazapine  7.5 mg Oral QHS   sertraline  50 mg Oral QHS    Continuous Infusions:  cefTRIAXone (ROCEPHIN)  IV 2 g (07/15/23 2102)     LOS: 2 days     Darlin Drop, MD [facility] Pager [phone]  If 7PM-7AM, please contact night-coverage [email or web address] Password [facility] 07/16/2023, 3:53 PM

## 2023-07-16 NOTE — Evaluation (Signed)
Clinical/Bedside Swallow Evaluation Patient Details  Name: Margaret Francis MRN: [id number] Date of Birth: 1930-05-14  Today's Date: 07/16/2023 Time: SLP Start Time (ACUTE ONLY): 1225 SLP Stop Time (ACUTE ONLY): 1310 SLP Time Calculation (min) (ACUTE ONLY): 45 min  Past Medical History:  Past Medical History:  Diagnosis Date   Abnormality of gait 03/20/2013   Bilateral foot-drop 07/28/2020   Cerebral ventriculomegaly    Gastroesophageal reflux disease    History of stroke    Right inferior cerebellum   Hypertension    Osteopenia    Peripheral neuropathy 11/29/2020   Stroke [facility])    Thyroid disease    Urinary frequency 04/19/2011   Urinary incontinence    Past Surgical History:  Past Surgical History:  Procedure Laterality Date   ABDOMINAL HYSTERECTOMY     APPENDECTOMY     BACK SURGERY     x2   BIOPSY VULVA  01/19/2023   BLADDER SURGERY  2010   CHOLECYSTECTOMY     ESOPHAGUS SURGERY     tumor removal.   TONSILLECTOMY     at age32   URETHRAL SLING  2010   HPI:  Margaret Francis is a 89 y.o. female with medical history significant for dementia, ambulatory dysfunction, bilateral foot drop, frequent falls, hypertension, hypothyroidism, GERD, depression, recent Klebsiella aeruginosa's UTI (07/10/23) who presents with altered mental status.  Workup revealed presumptive UTI with UA positive for pyuria, concern for possible NPH with ventriculomegaly seen on MRI.  Discussed with neurosurgery, Dr. Alta Corning, will arrange for outpatient follow-up.  Margaret Francis admitted w/ Acute metabolic encephalopathy, suspect multifactorial, related to presumptive UTI versus possible NPH and Sepsis per MD note; much improved today post txs given.    MRI: No acute intracranial process.  2. Redemonstrated ventriculomegaly.   CXR: Low lung volumes with left basilar atelectasis/infiltrates possibly.    Assessment / Plan / Recommendation  Clinical Impression   Margaret Francis seen for BSE today post Margaret Francis session. Margaret Francis awake, sitting in chair.  Caregiver present. Margaret Francis alert, verbal and followed instructions w/ cues. Baseline Dementia per chart dx.  On RA, afebrile. WBC WNL.  Margaret Francis appears to present w/ functional oropharyngeal phase swallowing w/ No oropharyngeal phase dysphagia noted, No sensorimotor deficits appreciated. Margaret Francis consumed po trials w/ No overt, clinical s/s of aspiration during po trials.  Margaret Francis appears at reduced risk for aspiration following general aspiration precautions and given tray setup and support at meals d/t Baseline Cognitive decline/Dementia; advanced age. Margaret Francis does have challenging factors that could impact her oropharyngeal swallowing to include deconditioning/weakness, Baseline Dementia, and advanced age as well as need for tray setup support. These factors can increase risk for aspiration, dysphagia as well as decreased oral intake overall.   During po trials, Margaret Francis consumed all consistencies w/ No overt coughing, decline in vocal quality, or change in respiratory presentation during/post trials. O2 sats remained 98%. Oral phase appeared [facility] w/ timely bolus management, mastication, and control of bolus propulsion for A-P transfer for swallowing. Oral clearing achieved w/ all trial consistencies -- moistened, soft foods given.  OM Exam appeared [facility] w/ no unilateral weakness noted. Speech Clear. Margaret Francis fed self w/ setup support.   Recommend a fairly Regular consistency diet w/ well-Cut meats, moistened foods; Thin liquids -- carefully monitor straw use, and Margaret Francis should Hold Cup when drinking. Recommend general aspiration precautions, less talking/distractions at meals. Tray setup at meals. Pills WHOLE in Puree for safer, easier swallowing -- for now and at Discharge.  Education given on Pills in Puree; food consistencies  and easy to eat options; general aspiration precautions to Margaret Francis and Caregiver; Handout given on precautions and Dysphagia Drink Cup for support. MD/NSG to reconsult if any new needs arise. No further skilled ST services  indicated currently. NSG updated, agreed. Caregiver/Margaret Francis agreed. Recommend Dietician f/u for support. SLP Visit Diagnosis: Dysphagia, unspecified (R13.10) (appears at her baseline; Caregiver agreed)    Aspiration Risk   (reduced when following general aspiration precautions)    Diet Recommendation   Thin;Age appropriate regular (w/ cut meats, moistened foods -- tray prep) = a fairly Regular consistency diet w/ well-Cut meats, moistened foods; Thin liquids -- carefully monitor straw use, and Margaret Francis should Hold Cup when drinking. Recommend general aspiration precautions, less talking/distractions at meals. Tray setup at meals.   Medication Administration: Whole meds with puree (for now and at D/C)    Other  Recommendations Recommended Consults:  (Dietician f/u) Oral Care Recommendations: Oral care BID;Oral care before and after PO;Staff/trained caregiver to provide oral care (support)    Recommendations for follow up therapy are one component of a multi-disciplinary discharge planning process, led by the attending physician.  Recommendations may be updated based on patient status, additional functional criteria and insurance authorization.  Follow up Recommendations No SLP follow up      Assistance Recommended at Discharge  Intermittent at meals  Functional Status Assessment Patient has had a recent decline in their functional status and demonstrates the ability to make significant improvements in function in a reasonable and predictable amount of time.  Frequency and Duration  (n/a)   (n/a)       Prognosis Prognosis for improved oropharyngeal function: Good Barriers to Reach Goals: Cognitive deficits;Time post onset;Severity of deficits Barriers/Prognosis Comment: advanced age; Cognitive decline      Swallow Study   General Date of Onset: 07/14/23 HPI: Margaret Francis is a 89 y.o. female with medical history significant for dementia, ambulatory dysfunction, bilateral foot drop, frequent falls,  hypertension, hypothyroidism, GERD, depression, recent Klebsiella aeruginosa's UTI (07/10/23) who presents with altered mental status.  Workup revealed presumptive UTI with UA positive for pyuria, concern for possible NPH with ventriculomegaly seen on MRI.  Discussed with neurosurgery, Dr. Alta Corning, will arrange for outpatient follow-up.  Margaret Francis admitted w/ Acute metabolic encephalopathy, suspect multifactorial, related to presumptive UTI versus possible NPH and Sepsis per MD note; much improved today post txs given.    MRI: No acute intracranial process.  2. Redemonstrated ventriculomegaly.   CXR: Low lung volumes with left basilar atelectasis/infiltrates possibly. Type of Study: Bedside Swallow Evaluation Previous Swallow Assessment: none Diet Prior to this Study: NPO Temperature Spikes Noted: No (wbc 8.8) Respiratory Status: Room air History of Recent Intubation: No Behavior/Cognition: Alert;Cooperative;Pleasant mood;Confused;Distractible;Requires cueing (baseline Dementia) Oral Cavity Assessment: Within Functional Limits Oral Care Completed by SLP: Yes Oral Cavity - Dentition: Adequate natural dentition Vision: Functional for self-feeding Self-Feeding Abilities: Able to feed self;Needs assist;Needs set up Patient Positioning: Upright in chair Baseline Vocal Quality: Normal Volitional Cough: Strong Volitional Swallow: Able to elicit    Oral/Motor/Sensory Function Overall Oral Motor/Sensory Function: Within functional limits   Ice Chips Ice chips: Within functional limits Presentation: Spoon (fed; 1 trial)   Thin Liquid Thin Liquid: Within functional limits Presentation: Cup;Self Fed (~3-4 ozs) Other Comments: had also drank from straw w/ NSG to swallow pills earlier w/out difficulty per NSG report    Nectar Thick Nectar Thick Liquid: Not tested   Honey Thick Honey Thick Liquid: Not tested   Puree Puree: Within functional limits Presentation: Spoon;Self Fed (~  3-4 ozs)   Solid     Solid:  Within functional limits Presentation: Self Fed (supported; 8 trials)         Jerilynn Som, MS, CCC-SLP Speech Language Pathologist Rehab Services; [facility] [place] [phone] (ascom) Kaveon Blatz 07/16/2023,5:26 PM

## 2023-07-16 NOTE — Evaluation (Signed)
Physical Therapy Evaluation Patient Details Name: Margaret Francis MRN: [id number] DOB: 05/23/1930 Today's Date: 07/16/2023  History of Present Illness  Pt is a 89 y.o. female initially presenting to hospital 07/10/23 with c/o fall; noted abrasion over her nose and eyebrow; c/o HA and neck pain; diagnosed with UTI; discharged back to facility.  Pt returned to ED 07/14/23 with c/o AMS.  Pt admitted with AMS, sepsis secondary to UTI, MRI with concern for NPH (follow-up with neurosurgery OP).   PMH includes htn, CVA, dementia, ambulatory dusfunction, foot drop, frequent falls, back sx x2.  Clinical Impression  Prior to recent medical concerns, pt required 1 assist with w/c level transfers (although pt has been requiring increased assist levels recently); lives at [facility]; caregiver checks in on pt daily.  PT/OT co-evaluation performed.  No c/o pain during session.  L foot drop noted (chronic per caregiver).  Currently pt is max assist x2 semi-supine to sitting EOB; mod assist x2 to stand from bed up to RW; and max assist x2 stand step/pivot turn bed to recliner with RW use.  Pt would currently benefit from skilled PT to address noted impairments and functional limitations (see below for any additional details).  Upon hospital discharge, pt would benefit from ongoing therapy.     If plan is discharge home, recommend the following: Two people to help with walking and/or transfers;Two people to help with bathing/dressing/bathroom;Assistance with cooking/housework;Direct supervision/assist for medications management;Direct supervision/assist for financial management;Assist for transportation;Help with stairs or ramp for entrance;Supervision due to cognitive status   Can travel by private vehicle   No    Equipment Recommendations Wheelchair (measurements PT);Wheelchair cushion (measurements PT);Hospital bed;Hoyer lift  Recommendations for Other Services  OT consult    Functional Status Assessment  Patient has had a recent decline in their functional status and demonstrates the ability to make significant improvements in function in a reasonable and predictable amount of time.     Precautions / Restrictions Precautions Precautions: Fall Precaution Comments: H/o L LE foot drop Restrictions Weight Bearing Restrictions: No      Mobility  Bed Mobility Overal bed mobility: Needs Assistance Bed Mobility: Supine to Sit     Supine to sit: Max assist, +2 for physical assistance, HOB elevated, Used rails     General bed mobility comments: assist for trunk and B LE's; vc's for technique    Transfers Overall transfer level: Needs assistance Equipment used: Rolling walker (2 wheels) Transfers: Sit to/from Stand, Bed to chair/wheelchair/BSC Sit to Stand: Mod assist, +2 physical assistance     Squat pivot transfers: Max assist, +2 physical assistance     General transfer comment: x2 trials standing with RW use; vc's for UE/LE positioning; assist to initiate and come to full stand; assist to control descent sitting; stand step/pivot turn bed to recliner    Ambulation/Gait               General Gait Details: Deferred (pt non-ambulatory)  Stairs            Wheelchair Mobility     Tilt Bed    Modified Rankin (Stroke Patients Only)       Balance Overall balance assessment: Needs assistance Sitting-balance support: Single extremity supported, Feet supported Sitting balance-Leahy Scale: Fair Sitting balance - Comments: steady static sitting but tends to lean onto R UE to maintain sitting balance   Standing balance support: Reliant on assistive device for balance Standing balance-Leahy Scale: Poor Standing balance comment: assist for standing balance (posterior  lean and mild L lean noted) with RW use                             Pertinent Vitals/Pain Pain Assessment Pain Assessment: No/denies pain Vitals (HR and SpO2 on room air) stable and WFL  throughout treatment session.    Home Living Family/patient expects to be discharged to:: Assisted living Living Arrangements: Alone Available Help at Discharge: Friend(s);Available PRN/intermittently (Caregiver ([name]) checks on pt daily)           Home Layout: One level Home Equipment: Agricultural consultant (2 wheels);Wheelchair - manual Additional Comments: Pt lives at [facility]; per caregiver pt is planning to transition to LTC 2/2 increased level of assistance required over last several months.    Prior Function Prior Level of Function : Needs assist       Physical Assist : ADLs (physical);Mobility (physical) Mobility (physical): Transfers;Bed mobility ADLs (physical): Bathing;Dressing;Toileting;IADLs Mobility Comments: Has been using manual w/c for functional mobility; stand pivots with assist to w/c on good days (other days pt requires assist to slide over to w/c from recliner); mobility more limited lately. ADLs Comments: Pt states Caregiver assists with bathing, dressing, and IADL management including meal prep. Pt sleeps in recliner. Has required increased assistance over last several months.     Extremity/Trunk Assessment   Upper Extremity Assessment Upper Extremity Assessment: Generalized weakness    Lower Extremity Assessment Lower Extremity Assessment: Generalized weakness LLE Deficits / Details: L DF 2/5    Cervical / Trunk Assessment Cervical / Trunk Assessment: Other exceptions Cervical / Trunk Exceptions: forward head/shoulders  Communication   Communication Communication: Difficulty following commands/understanding Following commands: Follows one step commands consistently;Follows one step commands with increased time Cueing Techniques: Verbal cues;Visual cues  Cognition Arousal: Alert Behavior During Therapy: WFL for tasks assessed/performed Overall Cognitive Status: Within Functional Limits for tasks assessed                                  General Comments: Oriented to self and hospital (not city).  Pt reported it was October 2025.        General Comments  Nursing cleared pt for participation in physical therapy.  Pt agreeable to PT session.  Pt's caregiver arrived during session.    Exercises     Assessment/Plan    PT Assessment Patient needs continued PT services  PT Problem List Decreased strength;Decreased activity tolerance;Decreased balance;Decreased mobility;Decreased knowledge of precautions       PT Treatment Interventions DME instruction;Functional mobility training;Therapeutic activities;Therapeutic exercise;Balance training;Patient/family education    PT Goals (Current goals can be found in the Care Plan section)  Acute Rehab PT Goals Patient Stated Goal: to improve strength and mobility PT Goal Formulation: With patient Time For Goal Achievement: 07/30/23 Potential to Achieve Goals: Fair    Frequency Min 1X/week     Co-evaluation PT/OT/SLP Co-Evaluation/Treatment: Yes Reason for Co-Treatment: For patient/therapist safety;To address functional/ADL transfers PT goals addressed during session: Mobility/safety with mobility;Balance;Proper use of DME OT goals addressed during session: ADL's and self-care;Proper use of Adaptive equipment and DME       AM-PAC PT "6 Clicks" Mobility  Outcome Measure Help needed turning from your back to your side while in a flat bed without using bedrails?: A Lot Help needed moving from lying on your back to sitting on the side of a flat bed without using bedrails?:  Total Help needed moving to and from a bed to a chair (including a wheelchair)?: Total Help needed standing up from a chair using your arms (e.g., wheelchair or bedside chair)?: Total Help needed to walk in hospital room?: Total Help needed climbing 3-5 steps with a railing? : Total 6 Click Score: 7    End of Session Equipment Utilized During Treatment: Gait belt Activity Tolerance: Patient  limited by fatigue Patient left: in chair;with call bell/phone within reach;with chair alarm set;with family/visitor present Nurse Communication: Mobility status;Precautions (via white board) PT Visit Diagnosis: Other abnormalities of gait and mobility (R26.89);Muscle weakness (generalized) (M62.81)    Time: 0981-1914 PT Time Calculation (min) (ACUTE ONLY): 18 min   Charges:   PT Evaluation $PT Eval Low Complexity: 1 Low   PT General Charges $$ ACUTE PT VISIT: 1 Visit        Hendricks Limes, PT 07/16/23, 1:47 PM

## 2023-07-17 DIAGNOSIS — R410 Disorientation, unspecified: Secondary | ICD-10-CM | POA: Diagnosis not present

## 2023-07-17 LAB — BASIC METABOLIC PANEL
Anion gap: 8 (ref 5–15)
BUN: 16 mg/dL (ref 8–23)
CO2: 23 mmol/L (ref 22–32)
Calcium: 10.3 mg/dL (ref 8.9–10.3)
Chloride: 106 mmol/L (ref 98–111)
Creatinine, Ser: 0.68 mg/dL (ref 0.44–1.00)
GFR, Estimated: >60 mL/min (ref >60)
Glucose, Bld: 129 mg/dL — ABNORMAL HIGH (ref 70–99)
Potassium: 3.5 mmol/L (ref 3.5–5.1)
Sodium: 137 mmol/L (ref 135–145)

## 2023-07-17 MED ORDER — SODIUM CHLORIDE 0.9 % IV SOLN: 2.0000 g | INTRAVENOUS | Status: DC

## 2023-07-17 MED ORDER — POTASSIUM PHOSPHATES 15 MMOLE/5ML IV SOLN
30.0000 mmol | Freq: Once | INTRAVENOUS | Status: AC
  Administered 2023-07-17: 30 mmol via INTRAVENOUS
  Filled 2023-07-17: qty 10

## 2023-07-17 MED ORDER — SODIUM CHLORIDE 0.9 % IV SOLN
2.0000 g | INTRAVENOUS | Status: AC
  Administered 2023-07-17: 2 g via INTRAVENOUS
  Filled 2023-07-17: qty 20

## 2023-07-17 NOTE — Care Management Important Message (Signed)
Important Message  Patient Details  Name: Margaret Francis MRN: [id number] Date of Birth: Jun 14, 1930   Important Message Given:  Yes - Medicare IM  I reviewed the Important Message from Medicare with the patients caregiver, Healther Cockman by phone [phone] and she is in agreement with the discharge and I thanked her for time.    Olegario Messier A Lamiyah Schlotter 07/17/2023, 10:37 AM

## 2023-07-17 NOTE — TOC Progression Note (Signed)
Transition of Care [facility]) - Progression Note    Patient Details  Name: Margaret Francis MRN: [id number] Date of Birth: December 06, 1929  Transition of Care [facility]) CM/SW Contact  Allena Katz, LCSW Phone Number: 07/17/2023, 9:46 AM  Clinical Narrative:    CSW spoke with patients caregiver who has accepted the bed offer at [facility]. CSW has sent a message to MD to see when able to start auth.    Expected Discharge Plan: Skilled Nursing Facility Barriers to Discharge: Continued Medical Work up  Expected Discharge Plan and Services                                               Social Determinants of Health (SDOH) Interventions SDOH Screenings   Food Insecurity: No Food Insecurity (07/16/2023)  Housing: Low Risk  (07/15/2023)  Transportation Needs: No Transportation Needs (07/15/2023)  Utilities: Not At Risk (07/15/2023)  Tobacco Use: Low Risk  (07/14/2023)    Readmission Risk Interventions     No data to display

## 2023-07-17 NOTE — Progress Notes (Signed)
PROGRESS NOTE  Margaret Francis QMV:[id number] DOB: 05-16-1930 DOA: 07/14/2023 PCP: Smiley Houseman, NP  HPI/Recap of past 24 hours: Margaret Francis is a 89 y.o. female with medical history significant for dementia, ambulatory dysfunction, bilateral foot drop, frequent falls, hypertension, hypothyroidism, GERD, depression, recent Klebsiella aeruginosa's UTI (07/10/23) who presents with altered mental status.  Workup revealed presumptive UTI with UA positive for pyuria and concern for possible NPH with ventriculomegaly seen on MRI.  Received empiric IV antibiotics.  Discussed possible NPH with neurosurgery.  Neurosurgery, Dr. Alta Corning, will arrange for outpatient follow-up.  Seen by PT OT recommended rehab.  TOC assisting with disposition.  07/17/2023: The patient was seen and examined at bedside.  No new complaints.  No acute events overnight.  Awaiting placement.  Assessment/Plan: Principal Problem:   AMS (altered mental status) Active Problems:   Sepsis secondary to UTI (HCC)   Hypertension   History of stroke   Severe sepsis (HCC)   Hypothyroidism   Dementia without behavioral disturbance (HCC)  Resolved sepsis secondary to presumed UTI, POA Presented with leukocytosis 17K and lactic acidosis 3.4.  UA positive for pyuria. Urine culture grew less than 10,000 colonies. Recent Klebsiella aeruginosa's UTI (07/10/23)  Received Rocephin empirically, completed on 07/17/2023.  Acute metabolic encephalopathy, suspect multifactorial, related to presumptive UTI versus possible NPH Continue to treat underlying conditions Reorient as needed Fall precautions.  Generalized weakness Seen by PT OT with recommendation for rehab [facility] assisting with placement. Continue fall precautions  Electrolytes abnormalities Hypokalemia Hypophosphatemia Repleted intravenously.  History of CVA MRI with ventriculomegaly, concern for NPH No acute ischemic or hemorrhagic infarcts Will follow-up with  neurosurgery outpatient. Continue home aspirin for secondary CVA prevention Continue fall precautions  Hypothyroidism Continue home thyroxine  Chronic anxiety/depression Continue home regimen.   Time: 30 minutes.   Code Status: DNR  Family Communication: Updated the caregiver at bedside.  Disposition Plan: Awaiting placement to SNF.  Consultants: Curbsided with neurosurgery.  Procedures: None.  Antimicrobials: Rocephin  DVT prophylaxis: SQ heparin 3 times daily  Status is: Inpatient The patient requires at least 2 midnights of evaluation and treatment of present condition.    Objective: Vitals:   07/16/23 2020 07/16/23 2350 07/17/23 0426 07/17/23 0843  BP: (!) 144/75 (!) 152/74 (!) 163/78 (!) 140/72  Pulse: 73 74 63 62  Resp: 16  18 14   Temp: 97.9 F (36.6 C) 98.1 F (36.7 C) 98.1 F (36.7 C) 97.6 F (36.4 C)  TempSrc:    Oral  SpO2: 100% 96% 94% 92%  Weight:      Height:        Intake/Output Summary (Last 24 hours) at 07/17/2023 1032 Last data filed at 07/17/2023 0606 Gross per 24 hour  Intake 221.87 ml  Output 1200 ml  Net -978.13 ml   Filed Weights   07/14/23 1904 07/15/23 2010 07/15/23 2035  Weight: 70.3 kg 85.9 kg 83.8 kg    Exam: No significant changes from prior exam.  General: 89 y.o. year-old female frail-appearing.  She is somnolent but easily arousable to voices. Cardiovascular: Regular rate and rhythm no rubs or gallops.   Respiratory: Clear to auscultation with no wheezes or rales.   Abdomen: Soft nontender with normal bowel sounds present.. Musculoskeletal: No lower extremity edema. 2/4 pulses in all 4 extremities. Skin: No ulcerative lesions noted or rashes, Psychiatry: Mood is appropriate for condition setting.   Data Reviewed: CBC: Recent Labs  Lab 07/14/23 1910 07/16/23 0614  WBC 17.7* 8.8  NEUTROABS 16.4*  --  HGB 13.6 11.9*  HCT 40.7 34.8*  MCV 92.7 89.5  PLT 223 189   Basic Metabolic Panel: Recent Labs   Lab 07/14/23 1910 07/16/23 0614 07/17/23 0544  NA 134* 134* 137  K 4.1 3.3* 3.5  CL 99 102 106  CO2 23 23 23   GLUCOSE 188* 103* 129*  BUN 19 18 16   CREATININE 0.81 0.71 0.68  CALCIUM 10.7* 10.2 10.3  MG 1.8 1.9  --   PHOS  --  1.9*  --    GFR: Estimated Creatinine Clearance: 51 mL/min (by C-G formula based on SCr of 0.68 mg/dL). Liver Function Tests: Recent Labs  Lab 07/14/23 1910  AST 40  ALT 29  ALKPHOS 111  BILITOT 0.7  PROT 7.5  ALBUMIN 4.0   No results for input(s): "LIPASE", "AMYLASE" in the last 168 hours. No results for input(s): "AMMONIA" in the last 168 hours. Coagulation Profile: Recent Labs  Lab 07/15/23 0557  INR 1.1   Cardiac Enzymes: No results for input(s): "CKTOTAL", "CKMB", "CKMBINDEX", "TROPONINI" in the last 168 hours. BNP (last 3 results) No results for input(s): "PROBNP" in the last 8760 hours. HbA1C: Recent Labs    07/14/23 1910  HGBA1C 6.4*   CBG: Recent Labs  Lab 07/16/23 1019  GLUCAP 111*   Lipid Profile: No results for input(s): "CHOL", "HDL", "LDLCALC", "TRIG", "CHOLHDL", "LDLDIRECT" in the last 72 hours. Thyroid Function Tests: Recent Labs    07/14/23 1910  TSH 2.241  FREET4 1.03   Anemia Panel: No results for input(s): "VITAMINB12", "FOLATE", "FERRITIN", "TIBC", "IRON", "RETICCTPCT" in the last 72 hours. Urine analysis:    Component Value Date/Time   COLORURINE YELLOW (A) 07/14/2023 2048   APPEARANCEUR HAZY (A) 07/14/2023 2048   LABSPEC 1.016 07/14/2023 2048   PHURINE 5.0 07/14/2023 2048   GLUCOSEU NEGATIVE 07/14/2023 2048   HGBUR MODERATE (A) 07/14/2023 2048   BILIRUBINUR NEGATIVE 07/14/2023 2048   BILIRUBINUR neg 04/19/2011 1221   KETONESUR 5 (A) 07/14/2023 2048   PROTEINUR NEGATIVE 07/14/2023 2048   UROBILINOGEN neg 04/19/2011 1221   NITRITE NEGATIVE 07/14/2023 2048   LEUKOCYTESUR MODERATE (A) 07/14/2023 2048   Sepsis Labs: @LABRCNTIP (procalcitonin:4,lacticidven:4)  ) Recent Results (from the past  240 hour(s))  Urine Culture     Status: Abnormal   Collection Time: 07/10/23  5:01 PM   Specimen: Urine, Catheterized  Result Value Ref Range Status   Specimen Description   Final    URINE, CATHETERIZED Performed at [facility], [address] [place], [place] [address]    Special Requests   Final    NONE Performed at [facility], [address] [place], [place] [address]    Culture >=100,000 COLONIES/mL KLEBSIELLA AEROGENES (A)  Final   Report Status 07/13/2023 FINAL  Final   Organism ID, Bacteria KLEBSIELLA AEROGENES (A)  Final      Susceptibility   Klebsiella aerogenes - MIC*    CEFEPIME <=0.12 SENSITIVE Sensitive     CEFTRIAXONE <=0.25 SENSITIVE Sensitive     CIPROFLOXACIN <=0.25 SENSITIVE Sensitive     GENTAMICIN <=1 SENSITIVE Sensitive     IMIPENEM 2 SENSITIVE Sensitive     NITROFURANTOIN 128 RESISTANT Resistant     TRIMETH/SULFA <=20 SENSITIVE Sensitive     PIP/TAZO <=4 SENSITIVE Sensitive ug/mL    * >=100,000 COLONIES/mL KLEBSIELLA AEROGENES  Blood Culture (routine x 2)     Status: None (Preliminary result)   Collection Time: 07/14/23  7:10 PM   Specimen: BLOOD  Result Value Ref Range Status  Specimen Description BLOOD BLOOD LEFT ARM  Final   Special Requests   Final    BOTTLES DRAWN AEROBIC AND ANAEROBIC Blood Culture results may not be optimal due to an excessive volume of blood received in culture bottles   Culture   Final    NO GROWTH 3 DAYS Performed at [facility], [name], [place], [place] [address]    Report Status PENDING  Incomplete  Blood Culture (routine x 2)     Status: None (Preliminary result)   Collection Time: 07/14/23  7:10 PM   Specimen: BLOOD  Result Value Ref Range Status   Specimen Description BLOOD BLOOD RIGHT ARM  Final   Special Requests   Final    BOTTLES DRAWN AEROBIC AND ANAEROBIC Blood Culture results may not be optimal due to an inadequate volume of blood received in culture bottles    Culture   Final    NO GROWTH 3 DAYS Performed at [facility], [address] [place], [place] [address]    Report Status PENDING  Incomplete  Urine Culture     Status: Abnormal   Collection Time: 07/14/23  8:48 PM   Specimen: Urine, Random  Result Value Ref Range Status   Specimen Description   Final    URINE, RANDOM Performed at [facility], [address] [place], [place] [address]    Special Requests   Final    NONE Reflexed from [phone] Performed at [facility], [address] [place], [place] [address]    Culture (A)  Final    <10,000 COLONIES/mL INSIGNIFICANT GROWTH Performed at [facility], [address], [place], [place] [address]    Report Status 07/16/2023 FINAL  Final      Studies: No results found.  Scheduled Meds:  aspirin EC  81 mg Oral Daily   heparin  5,000 Units Subcutaneous Q8H   levothyroxine  50 mcg Oral Q0600   mirabegron ER  50 mg Oral Daily   mirtazapine  7.5 mg Oral QHS   sertraline  50 mg Oral QHS    Continuous Infusions:  cefTRIAXone (ROCEPHIN)  IV Stopped (07/16/23 2135)   potassium PHOSPHATE IVPB (in mmol) 85 mL/hr at 07/17/23 0606     LOS: 3 days     Darlin Drop, MD [facility] Pager [phone]  If 7PM-7AM, please contact night-coverage [email or web address] Password [facility] 07/17/2023, 10:32 AM

## 2023-07-18 DIAGNOSIS — A419 Sepsis, unspecified organism: Secondary | ICD-10-CM | POA: Diagnosis not present

## 2023-07-18 DIAGNOSIS — G9389 Other specified disorders of brain: Secondary | ICD-10-CM | POA: Diagnosis not present

## 2023-07-18 DIAGNOSIS — M21372 Foot drop, left foot: Secondary | ICD-10-CM | POA: Diagnosis not present

## 2023-07-18 DIAGNOSIS — R4182 Altered mental status, unspecified: Secondary | ICD-10-CM

## 2023-07-18 DIAGNOSIS — M21371 Foot drop, right foot: Secondary | ICD-10-CM | POA: Diagnosis not present

## 2023-07-18 DIAGNOSIS — Z8673 Personal history of transient ischemic attack (TIA), and cerebral infarction without residual deficits: Secondary | ICD-10-CM | POA: Diagnosis not present

## 2023-07-18 DIAGNOSIS — R69 Illness, unspecified: Secondary | ICD-10-CM | POA: Diagnosis not present

## 2023-07-18 DIAGNOSIS — N3281 Overactive bladder: Secondary | ICD-10-CM | POA: Diagnosis not present

## 2023-07-18 DIAGNOSIS — I639 Cerebral infarction, unspecified: Secondary | ICD-10-CM | POA: Diagnosis not present

## 2023-07-18 DIAGNOSIS — R269 Unspecified abnormalities of gait and mobility: Secondary | ICD-10-CM | POA: Diagnosis not present

## 2023-07-18 DIAGNOSIS — D696 Thrombocytopenia, unspecified: Secondary | ICD-10-CM | POA: Diagnosis not present

## 2023-07-18 DIAGNOSIS — Z743 Need for continuous supervision: Secondary | ICD-10-CM | POA: Diagnosis not present

## 2023-07-18 DIAGNOSIS — K5900 Constipation, unspecified: Secondary | ICD-10-CM | POA: Diagnosis not present

## 2023-07-18 DIAGNOSIS — K219 Gastro-esophageal reflux disease without esophagitis: Secondary | ICD-10-CM | POA: Diagnosis not present

## 2023-07-18 DIAGNOSIS — R2689 Other abnormalities of gait and mobility: Secondary | ICD-10-CM | POA: Diagnosis not present

## 2023-07-18 DIAGNOSIS — E039 Hypothyroidism, unspecified: Secondary | ICD-10-CM | POA: Diagnosis not present

## 2023-07-18 DIAGNOSIS — N390 Urinary tract infection, site not specified: Secondary | ICD-10-CM | POA: Diagnosis not present

## 2023-07-18 DIAGNOSIS — G629 Polyneuropathy, unspecified: Secondary | ICD-10-CM | POA: Diagnosis not present

## 2023-07-18 DIAGNOSIS — I10 Essential (primary) hypertension: Secondary | ICD-10-CM | POA: Diagnosis not present

## 2023-07-18 DIAGNOSIS — G9341 Metabolic encephalopathy: Secondary | ICD-10-CM | POA: Diagnosis not present

## 2023-07-18 DIAGNOSIS — R296 Repeated falls: Secondary | ICD-10-CM | POA: Diagnosis not present

## 2023-07-18 DIAGNOSIS — R531 Weakness: Secondary | ICD-10-CM | POA: Diagnosis not present

## 2023-07-18 MED ORDER — ACETAMINOPHEN 325 MG PO TABS: 650.0000 mg | ORAL_TABLET | Freq: Four times a day (QID) | ORAL | Status: DC | PRN

## 2023-07-18 NOTE — TOC Progression Note (Addendum)
Transition of Care [facility]) - Progression Note    Patient Details  Name: Margaret Francis MRN: [id number] Date of Birth: 01-26-1930  Transition of Care [facility]) CM/SW Contact  Allena Katz, LCSW Phone Number: 07/18/2023, 8:56 AM  Clinical Narrative:   Auth approved for [facility] [id number]. Berkley Harvey is good until 12/2. French Ana notified.    Expected Discharge Plan: Skilled Nursing Facility Barriers to Discharge: Continued Medical Work up  Expected Discharge Plan and Services                                               Social Determinants of Health (SDOH) Interventions SDOH Screenings   Food Insecurity: No Food Insecurity (07/16/2023)  Housing: Low Risk  (07/15/2023)  Transportation Needs: No Transportation Needs (07/15/2023)  Utilities: Not At Risk (07/15/2023)  Tobacco Use: Low Risk  (07/14/2023)    Readmission Risk Interventions     No data to display

## 2023-07-18 NOTE — Progress Notes (Signed)
Physical Therapy Treatment Patient Details Name: Margaret Francis MRN: [id number] DOB: 08-21-1930 Today's Date: 07/18/2023   History of Present Illness Pt is a 89 y.o. female initially presenting to hospital 07/10/23 with c/o fall; noted abrasion over her nose and eyebrow; c/o HA and neck pain; diagnosed with UTI; discharged back to facility.  Pt returned to ED 07/14/23 with c/o AMS.  Pt admitted with AMS, sepsis secondary to UTI, MRI with concern for NPH (follow-up with neurosurgery OP).   PMH includes htn, CVA, dementia, ambulatory dusfunction, foot drop, frequent falls, back sx x2.    PT Comments  Pt received in bed lethargic with daughter at bedside and agreed to PT session with encouragement. Pt performed bed mobility MaxA with the use of HOB elevated/bed rails, STS with the use of RW (2wheels) ModA+2, and step pivot transfer to recliner MaxA+2. VC with standing balance and transfer to recliner required for RW management, upright posture and BLE foot placement when taking steps. L foot drop noted towards the end of transfer. Due to fatigue and generalized weakness, pt was unable to hold themselves upright with standing balance or during transfer. Pt tolerated Tx fair and will continue to benefit from skilled PT sessions to improve functional mobility, strength, and activity tolerance to maximize safety/IND following D/C.     If plan is discharge home, recommend the following: Two people to help with walking and/or transfers;Two people to help with bathing/dressing/bathroom;Assistance with cooking/housework;Direct supervision/assist for medications management;Direct supervision/assist for financial management;Assist for transportation;Help with stairs or ramp for entrance;Supervision due to cognitive status   Can travel by private vehicle     No  Equipment Recommendations  Wheelchair (measurements PT);Wheelchair cushion (measurements PT);Hospital bed;Hoyer lift    Recommendations for Other  Services OT consult     Precautions / Restrictions Precautions Precautions: Fall Precaution Comments: H/o L LE foot drop Restrictions Weight Bearing Restrictions: No     Mobility  Bed Mobility Overal bed mobility: Needs Assistance Bed Mobility: Supine to Sit     Supine to sit: HOB elevated, Used rails, Max assist     General bed mobility comments: assist for trunk and B LE's; vc's for technique    Transfers Overall transfer level: Needs assistance Equipment used: Rolling walker (2 wheels) Transfers: Sit to/from Stand, Bed to chair/wheelchair/BSC Sit to Stand: Mod assist, +2 physical assistance   Step pivot transfers: Max assist, +2 physical assistance, +2 safety/equipment       General transfer comment: Pt performed Step pivot transfer MaxA+2 with VC necessary for RW management, upright posture, and foot placement when taking steps. L foot drop noted.    Ambulation/Gait               General Gait Details: Deferred (pt non-ambulatory)   Stairs             Wheelchair Mobility     Tilt Bed    Modified Rankin (Stroke Patients Only)       Balance Overall balance assessment: Needs assistance Sitting-balance support: Single extremity supported, Feet supported Sitting balance-Leahy Scale: Fair     Standing balance support: Reliant on assistive device for balance Standing balance-Leahy Scale: Poor                              Cognition Arousal: Lethargic Behavior During Therapy: Flat affect Overall Cognitive Status: Within Functional Limits for tasks assessed (Pt lethargic throughout session)  General Comments: Pt pleasant and willing to participate in PT session with encouragement        Exercises      General Comments        Pertinent Vitals/Pain Pain Assessment Pain Assessment: No/denies pain    Home Living                          Prior Function             PT Goals (current goals can now be found in the care plan section) Acute Rehab PT Goals Patient Stated Goal: to improve strength and mobility PT Goal Formulation: With patient Time For Goal Achievement: 07/30/23 Potential to Achieve Goals: Fair Progress towards PT goals: Progressing toward goals    Frequency    Min 1X/week      PT Plan      Co-evaluation              AM-PAC PT "6 Clicks" Mobility   Outcome Measure  Help needed turning from your back to your side while in a flat bed without using bedrails?: A Lot Help needed moving from lying on your back to sitting on the side of a flat bed without using bedrails?: Total Help needed moving to and from a bed to a chair (including a wheelchair)?: Total Help needed standing up from a chair using your arms (e.g., wheelchair or bedside chair)?: Total Help needed to walk in hospital room?: Total Help needed climbing 3-5 steps with a railing? : Total 6 Click Score: 7    End of Session Equipment Utilized During Treatment: Gait belt Activity Tolerance: Patient limited by fatigue Patient left: in chair;with call bell/phone within reach;with chair alarm set;with family/visitor present Nurse Communication: Mobility status PT Visit Diagnosis: Other abnormalities of gait and mobility (R26.89);Muscle weakness (generalized) (M62.81)     Time: 4166-0630 PT Time Calculation (min) (ACUTE ONLY): 20 min  Charges:    $Therapeutic Activity: 8-22 mins PT General Charges $$ ACUTE PT VISIT: 1 Visit                     Margaret Francis SPT, LAT, ATC  Margaret Francis 07/18/2023, 12:53 PM

## 2023-07-18 NOTE — Progress Notes (Signed)
Called [facility] at [phone] and gave report to Candace Cruise LPN

## 2023-07-18 NOTE — TOC Transition Note (Signed)
Transition of Care [facility]) - CM/SW Discharge Note   Patient Details  Name: Margaret Francis MRN: [id number] Date of Birth: April 29, 1930  [facility]) CM/SW Contact:  Allena Katz, LCSW Phone Number: 07/18/2023, 9:51 AM   Clinical Narrative:   Pt to discharge to [facility]. RN given number for report. DC summary to be sent once in. Medical necessity printed to unit. DNR signed and on chart. CSW will send DC summary once in.    Final next level of care: Skilled Nursing Facility Barriers to Discharge: Barriers Resolved   Patient Goals and CMS Choice CMS Medicare.gov Compare Post Acute Care list provided to:: Patient Represenative (must comment) (heather)    Discharge Placement                Patient chooses bed at: [facility] Patient to be transferred to facility by: [facility] Name of family member notified: heather Patient and family notified of of transfer: 07/18/23  Discharge Plan and Services Additional resources added to the After Visit Summary for                                       Social Determinants of Health (SDOH) Interventions SDOH Screenings   Food Insecurity: No Food Insecurity (07/16/2023)  Housing: Low Risk  (07/15/2023)  Transportation Needs: No Transportation Needs (07/15/2023)  Utilities: Not At Risk (07/15/2023)  Tobacco Use: Low Risk  (07/14/2023)     Readmission Risk Interventions     No data to display

## 2023-07-18 NOTE — Discharge Summary (Signed)
Physician Discharge Summary   Patient: Margaret Francis MRN: [id number] DOB: January 31, 1930  Admit date:     07/14/2023  Discharge date: 07/18/23  Discharge Physician: Loyce Dys   PCP: Smiley Houseman, NP   Recommendations at discharge:  Follow-up with primary care  Discharge Diagnoses: Resolved sepsis secondary to presumed UTI, POA Acute metabolic encephalopathy, suspect multifactorial, related to presumptive UTI versus possible NPH Generalized weakness Electrolytes abnormalities History of CVA MRI with ventriculomegaly, concern for NPH Hypothyroidism Chronic anxiety/depression   Hospital Course: Margaret Francis is a 89 y.o. female with medical history significant for dementia, ambulatory dysfunction, bilateral foot drop, frequent falls, hypertension, hypothyroidism, GERD, depression, recent Klebsiella aeruginosa's UTI (07/10/23) who presents with altered mental status.  Workup revealed presumptive UTI with UA positive for pyuria and concern for possible NPH with ventriculomegaly seen on MRI.  Received empiric IV antibiotics.  Discussed possible NPH with neurosurgery.  Neurosurgery, Dr. Alta Corning, will arrange for outpatient follow-up.  Seen by PT OT recommended rehab.  Patient is now stable for discharge to skilled nursing facility and to follow-up with neurosurgery.   Consultants: None Procedures performed: None Disposition: Home Diet recommendation:  Cardiac diet DISCHARGE MEDICATION: Allergies as of 07/18/2023       Reactions   Demerol Nausea And Vomiting   Morphine And Codeine Nausea And Vomiting        Medication List     STOP taking these medications    cephALEXin 500 MG capsule Commonly known as: KEFLEX   losartan 100 MG tablet Commonly known as: COZAAR   nitrofurantoin (macrocrystal-monohydrate) 100 MG capsule Commonly known as: MACROBID       TAKE these medications    acetaminophen 325 MG tablet Commonly known as: TYLENOL Take 2 tablets (650 mg  total) by mouth every 6 (six) hours as needed for mild pain (pain score 1-3) (or Fever >/= 101).   aspirin 81 MG tablet Take 81 mg by mouth daily.   Calcium Carbonate Antacid 400 MG Chew Chew 400 mg by mouth every 8 (eight) hours as needed.   clobetasol ointment 0.05 % Commonly known as: TEMOVATE Spread the labia/lips as wide as possible and apply half applicator full tube of medicine to the opening of the vagina qhs x 2 month, then three times  weekly until seen in the office.   hydrochlorothiazide 12.5 MG tablet Commonly known as: HYDRODIURIL Take 12.5 mg by mouth daily.   levothyroxine 50 MCG tablet Commonly known as: SYNTHROID Take 50 mcg by mouth daily.   mirabegron ER 50 MG Tb24 tablet Commonly known as: Myrbetriq Take 1 tablet (50 mg total) by mouth daily.   mirtazapine 7.5 MG tablet Commonly known as: REMERON Take 7.5 mg by mouth at bedtime.   pantoprazole 20 MG tablet Commonly known as: PROTONIX Take 20 mg by mouth daily.   polyethylene glycol 17 g packet Commonly known as: MIRALAX / GLYCOLAX Take 17 g by mouth at bedtime. Given at bedtime on Mon, Wed and Fri   psyllium 0.52 g capsule Commonly known as: REGULOID Take 0.52 g by mouth 2 (two) times daily.   sertraline 50 MG tablet Commonly known as: ZOLOFT 1 tablet   Vitamin D3 50 MCG (2000 UT) capsule Take 2,000 Units by mouth daily.   Zinc Oxide 15 % Crea Apply 1 application  topically 3 (three) times daily.        Contact information for follow-up providers     Smiley Houseman, NP. Call today.   Specialty: Nurse  Practitioner Why: Facility will do a follow up once discharged from hospital Contact information: [address] [place] [address] [phone]         Hipolito Bayley, MD Follow up.   Specialty: Neurosurgery Contact information: [address] [place] [address] [phone]              Contact information for after-discharge care      Destination     [facility] [place] [facility] .   Service: Skilled Nursing Contact information: [address] [place] [address] [phone]                    Discharge Exam: Ceasar Mons Weights   07/14/23 1904 07/15/23 2010 07/15/23 2035  Weight: 70.3 kg 85.9 kg 83.8 kg   General: 89 y.o. year-old female frail-appearing.  She is somnolent but easily arousable to voices. Cardiovascular: Regular rate and rhythm no rubs or gallops.   Respiratory: Clear to auscultation with no wheezes or rales.   Abdomen: Soft nontender with normal bowel sounds present.. Musculoskeletal: No lower extremity edema. 2/4 pulses in all 4 extremities. Skin: No ulcerative lesions noted or rashes, Psychiatry: Mood is appropriate for condition setting.  Condition at discharge: good  The results of significant diagnostics from this hospitalization (including imaging, microbiology, ancillary and laboratory) are listed below for reference.   Imaging Studies: MR BRAIN WO CONTRAST  Result Date: 07/15/2023 CLINICAL DATA:  Syncope/presyncope, concern for NPH, history of dementia EXAM: MRI HEAD WITHOUT CONTRAST TECHNIQUE: Multiplanar, multiecho pulse sequences of the brain and surrounding structures were obtained without intravenous contrast. COMPARISON:  07/10/2023 CT head, 06/26/2020 MRI head FINDINGS: Brain: No restricted diffusion to suggest acute or subacute infarct. No acute hemorrhage, mass, mass effect, midline shift, or extra-axial collection. Pituitary and craniocervical junction within normal limits. Redemonstrated ventriculomegaly, with acute callosal angle, crowding of the sulci near the vertex, and thinning of the corpus callosum, with T2 hyperintense signal around the ventricles that could represent transependymal flow of CSF and/or chronic small vessel ischemic disease. Vascular: Normal arterial flow voids. Skull and upper cervical spine: Normal marrow  signal. Sinuses/Orbits: Mucosal thickening in the right maxillary sinus and ethmoid air cells. Status post bilateral lens replacements. No acute finding in the orbits. Other: Fluid in the right mastoid air cells. IMPRESSION: 1. No acute intracranial process. 2. Redemonstrated ventriculomegaly, with acute callosal angle, crowding of the sulci near the vertex, thinning of the corpus callosum, and possible transependymal flow of CSF, all of which remain concerning for normal pressure hydrocephalus. Electronically Signed   By: Wiliam Ke M.D.   On: 07/15/2023 02:43   DG Abd Portable 1V  Result Date: 07/15/2023 CLINICAL DATA:  Syncope/presyncope EXAM: PORTABLE ABDOMEN - 1 VIEW COMPARISON:  None Available. FINDINGS: Cholecystectomy clips are present. The bowel gas pattern is normal. There is a large amount of stool in the rectum. No radio-opaque calculi. The bones are osteopenic. There are degenerative changes of both shoulders. IMPRESSION: Nonobstructive bowel gas pattern. Large amount of stool in the rectum. Electronically Signed   By: Darliss Cheney M.D.   On: 07/15/2023 00:36   DG Chest Port 1 View  Result Date: 07/14/2023 CLINICAL DATA:  Questionable sepsis. Poor historian. Evaluate for abnormality. EXAM: PORTABLE CHEST 1 VIEW COMPARISON:  05/08/2023 FINDINGS: Stable cardiomediastinal silhouette. Aortic atherosclerotic calcification. Low lung volumes. Left basilar atelectasis or infiltrates. Otherwise no focal consolidation, pleural effusion, or pneumothorax. Remote right rib fractures.  IMPRESSION: Low lung volumes with left basilar atelectasis or infiltrates. Electronically Signed   By: Minerva Fester M.D.   On: 07/14/2023 19:38   CT Cervical Spine Wo Contrast  Result Date: 07/10/2023 CLINICAL DATA:  Neck trauma (Age >= 65y).  Fall. EXAM: CT CERVICAL SPINE WITHOUT CONTRAST TECHNIQUE: Multidetector CT imaging of the cervical spine was performed without intravenous contrast. Multiplanar CT image  reconstructions were also generated. RADIATION DOSE REDUCTION: This exam was performed according to the departmental dose-optimization program which includes automated exposure control, adjustment of the mA and/or kV according to patient size and/or use of iterative reconstruction technique. COMPARISON:  Cervical spine CT 06/29/2022 FINDINGS: Alignment: Chronic cervical spine straightening.  No listhesis. Skull base and vertebrae: No acute fracture or suspicious osseous lesion. Diffuse osteopenia. Soft tissues and spinal canal: No prevertebral fluid or swelling. No visible canal hematoma. Disc levels: Overall mild spondylosis for age without evidence of high-grade spinal canal stenosis. Partial interbody and right facet ankylosis at C3-4 with severe right-sided osseous neural foraminal stenosis. Severe right foraminal stenosis at C6-7 due to uncovertebral spurring. Upper chest: Clear lung apices. Other: None. IMPRESSION: No acute cervical spine fracture or traumatic malalignment. Electronically Signed   By: Sebastian Ache M.D.   On: 07/10/2023 19:00   CT Head Wo Contrast  Result Date: 07/10/2023 CLINICAL DATA:  Head trauma, minor (Age >= 65y) EXAM: CT HEAD WITHOUT CONTRAST TECHNIQUE: Contiguous axial images were obtained from the base of the skull through the vertex without intravenous contrast. RADIATION DOSE REDUCTION: This exam was performed according to the departmental dose-optimization program which includes automated exposure control, adjustment of the mA and/or kV according to patient size and/or use of iterative reconstruction technique. COMPARISON:  Head CT 07/08/2023, 06/29/2022, 01/03/2019 FINDINGS: Brain: No hemorrhage. No extra-axial fluid collection. No mass effect. No mass lesion. No CT evidence of an acute cortical infarct. Redemonstrated ventriculomegaly with an acute callosal angle and crowding of the sulci at the vertex, which can be seen in the setting of normal pressure hydrocephalus.  Vascular: No hyperdense vessel or unexpected calcification. Skull: Normal. Negative for fracture or focal lesion. Sinuses/Orbits: No middle ear or mastoid effusion. Paranasal sinuses are clear. Orbits are unremarkable. Other: None. IMPRESSION: 1. No CT evidence of intracranial injury. 2. Redemonstrated ventriculomegaly with an acute callosal angle and crowding of the sulci at the vertex, which can be seen in the setting of normal pressure hydrocephalus. Electronically Signed   By: Lorenza Cambridge M.D.   On: 07/10/2023 16:22    Microbiology: Results for orders placed or performed during the hospital encounter of 07/14/23  Blood Culture (routine x 2)     Status: None (Preliminary result)   Collection Time: 07/14/23  7:10 PM   Specimen: BLOOD  Result Value Ref Range Status   Specimen Description BLOOD BLOOD LEFT ARM  Final   Special Requests   Final    BOTTLES DRAWN AEROBIC AND ANAEROBIC Blood Culture results may not be optimal due to an excessive volume of blood received in culture bottles   Culture   Final    NO GROWTH 4 DAYS Performed at [facility], [address] [place], [place] [address]    Report Status PENDING  Incomplete  Blood Culture (routine x 2)     Status: None (Preliminary result)   Collection Time: 07/14/23  7:10 PM   Specimen: BLOOD  Result Value Ref Range Status   Specimen Description BLOOD BLOOD RIGHT ARM  Final   Special Requests  Final    BOTTLES DRAWN AEROBIC AND ANAEROBIC Blood Culture results may not be optimal due to an inadequate volume of blood received in culture bottles   Culture   Final    NO GROWTH 4 DAYS Performed at [facility], [address], [place], [place] [address]    Report Status PENDING  Incomplete  Urine Culture     Status: Abnormal   Collection Time: 07/14/23  8:48 PM   Specimen: Urine, Random  Result Value Ref Range Status   Specimen Description   Final    URINE, RANDOM Performed at [facility], [address], [place], [place] [address]    Special Requests   Final    NONE Reflexed from [id number] Performed at [facility], [address] [place], [place] [address]    Culture (A)  Final    <10,000 COLONIES/mL INSIGNIFICANT GROWTH Performed at [facility], [address], [place], [place] [address]    Report Status 07/16/2023 FINAL  Final    Labs: CBC: Recent Labs  Lab 07/14/23 1910 07/16/23 0614  WBC 17.7* 8.8  NEUTROABS 16.4*  --   HGB 13.6 11.9*  HCT 40.7 34.8*  MCV 92.7 89.5  PLT 223 189   Basic Metabolic Panel: Recent Labs  Lab 07/14/23 1910 07/16/23 0614 07/17/23 0544  NA 134* 134* 137  K 4.1 3.3* 3.5  CL 99 102 106  CO2 23 23 23   GLUCOSE 188* 103* 129*  BUN 19 18 16   CREATININE 0.81 0.71 0.68  CALCIUM 10.7* 10.2 10.3  MG 1.8 1.9  --   PHOS  --  1.9*  --    Liver Function Tests: Recent Labs  Lab 07/14/23 1910  AST 40  ALT 29  ALKPHOS 111  BILITOT 0.7  PROT 7.5  ALBUMIN 4.0   CBG: Recent Labs  Lab 07/16/23 1019  GLUCAP 111*    Discharge time spent:  38 minutes.  Signed: Loyce Dys, MD [facility] 07/18/2023

## 2023-07-19 LAB — CULTURE, BLOOD (ROUTINE X 2)
Culture: NO GROWTH
Culture: NO GROWTH

## 2023-07-20 DIAGNOSIS — I639 Cerebral infarction, unspecified: Secondary | ICD-10-CM | POA: Diagnosis not present

## 2023-07-20 DIAGNOSIS — A419 Sepsis, unspecified organism: Secondary | ICD-10-CM | POA: Diagnosis not present

## 2023-07-20 DIAGNOSIS — I10 Essential (primary) hypertension: Secondary | ICD-10-CM | POA: Diagnosis not present

## 2023-07-20 DIAGNOSIS — R2689 Other abnormalities of gait and mobility: Secondary | ICD-10-CM | POA: Diagnosis not present

## 2023-07-20 DIAGNOSIS — K219 Gastro-esophageal reflux disease without esophagitis: Secondary | ICD-10-CM | POA: Diagnosis not present

## 2023-07-20 DIAGNOSIS — N390 Urinary tract infection, site not specified: Secondary | ICD-10-CM | POA: Diagnosis not present

## 2023-07-26 DIAGNOSIS — I639 Cerebral infarction, unspecified: Secondary | ICD-10-CM | POA: Diagnosis not present

## 2023-07-26 DIAGNOSIS — D696 Thrombocytopenia, unspecified: Secondary | ICD-10-CM | POA: Diagnosis not present

## 2023-07-30 ENCOUNTER — Other Ambulatory Visit: Payer: Self-pay | Admitting: *Deleted

## 2023-07-30 NOTE — Patient Outreach (Signed)
Mrs. Faber resides in [facility]. Screening for complex care management services as benefit of health plan and PCP.  Previous update from [place], Programmer, systems. Ms. Commons anticipated transition plan is for LTC.  No identifiable complex care management needs.   Raiford Noble, MSN, RN, BSN [place]  [facility], [facility] RN Post- Acute Care Manager Direct Dial: [phone]

## 2023-09-01 IMAGING — CR DG CHEST 1V PORT
1 series · 1 of 1 positions shown · non-contrast
Comparison: 07/21/2009

CLINICAL DATA: Trauma, fall

EXAM:
PORTABLE CHEST 1 VIEW

[dg chest port 1 view]
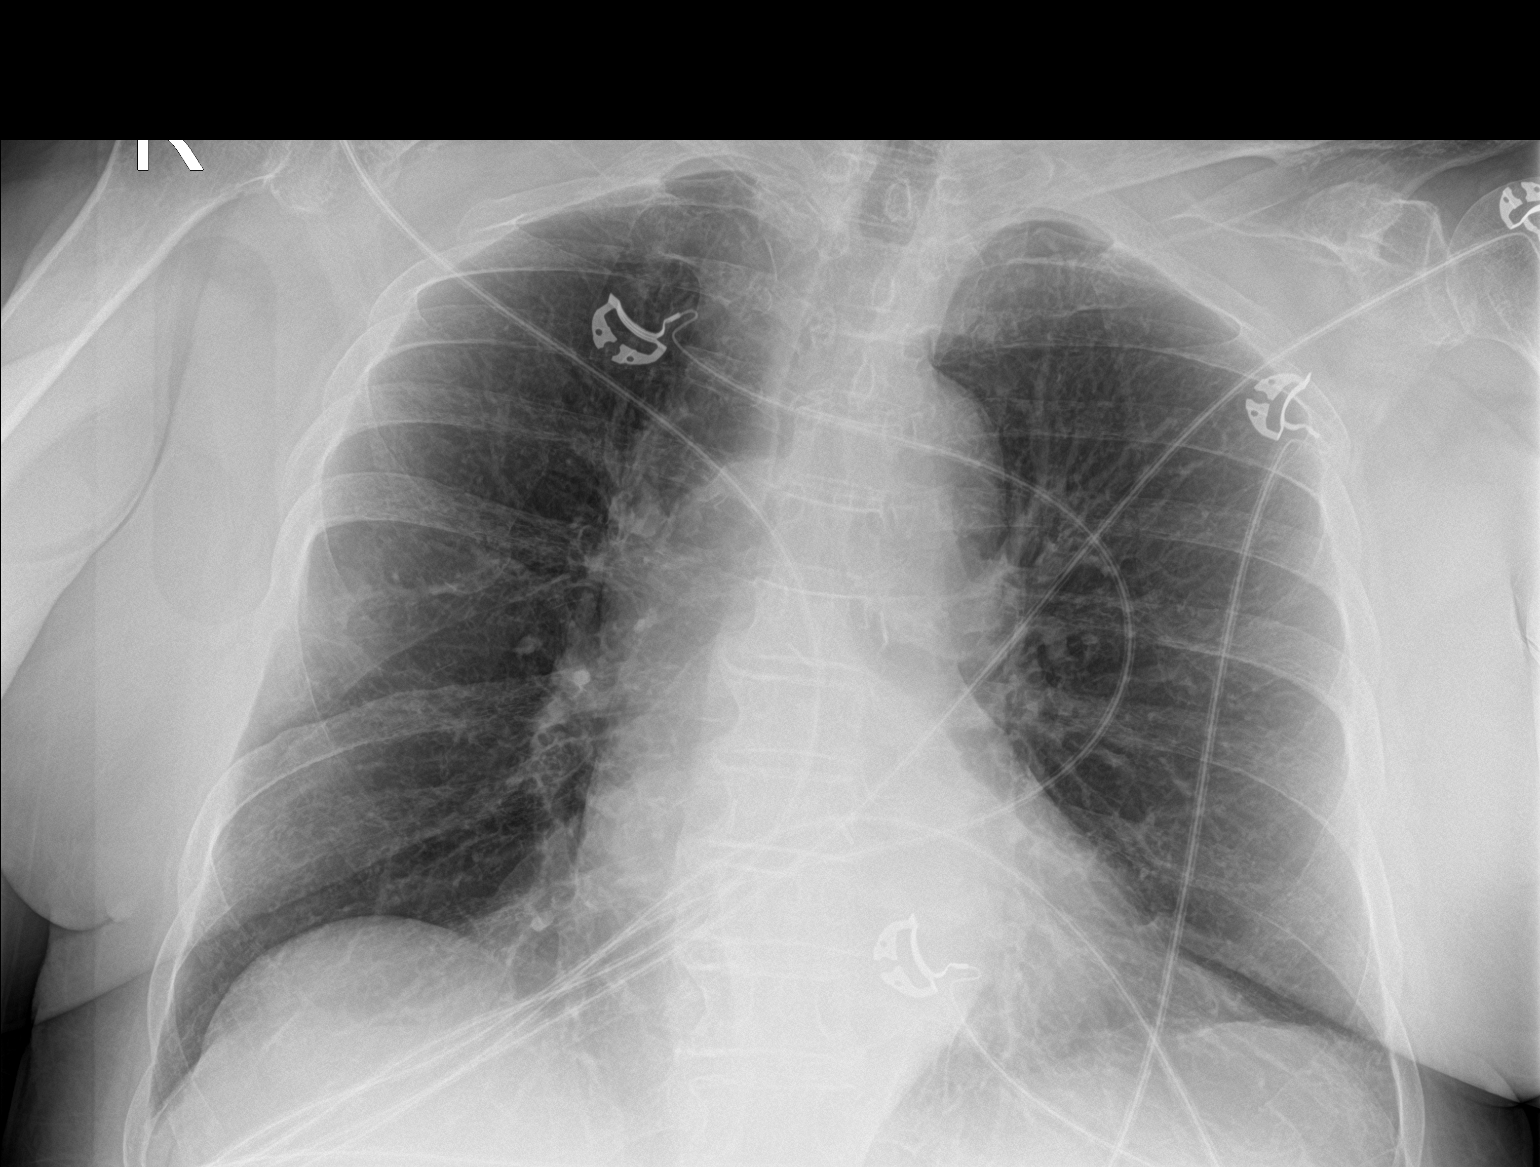

[1 of 1 positions shown; findings below may reference images not displayed]

FINDINGS: Cardiac size is within normal limits. Thoracic aorta is tortuous and
ectatic. There are no signs of pulmonary edema. There is subtle
increased density in the lateral aspect of right mid lung fields
between the posterior aspect of right sixth and seventh ribs. There
is increased distance between the posterior aspect of right sixth
and seventh ribs which may be a congenital variation or residual
change from previous intervention. Rest of the lung fields are
unremarkable. There is no definite focal pulmonary consolidation.
There is no pleural effusion or pneumothorax.
IMPRESSION: There is subtle increased density in the lateral aspect of right mid
lung fields which may suggest pleural thickening. Less likely
possibility would be early pneumonia. Follow-up PA and lateral views
[DATE] be considered.

There are no signs of pulmonary edema or definite focal pulmonary
consolidation. There is no pleural effusion or pneumothorax.

## 2023-09-01 IMAGING — CT CT ABD-PELV W/O CM
2 of 4 series · 15 of 46 positions shown, 17 images · non-contrast
Comparison: None Available.

CLINICAL DATA: Altered mental status, nausea, vomiting, diarrhea x
6 days



[Series 3: ap without · axial · non-contrast · 0.84mm/px · z∈[-915,-490]mm · 12 of 97 slices shown, 14 images]
[im 6/97  soft-tissue]
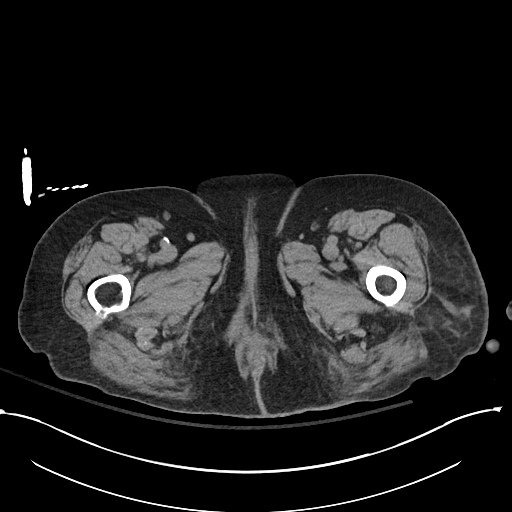
[im 6/97  bone]
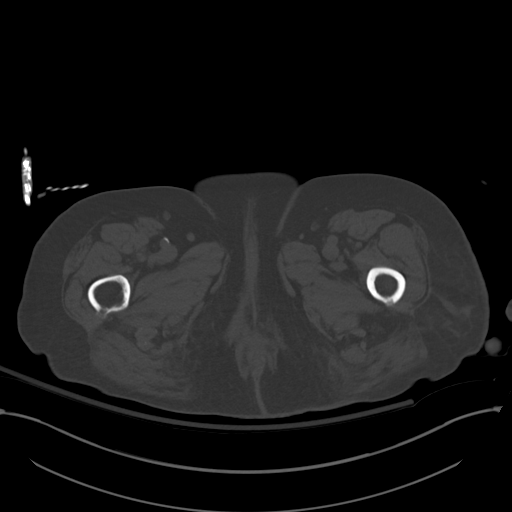
[im 17/97  soft-tissue]
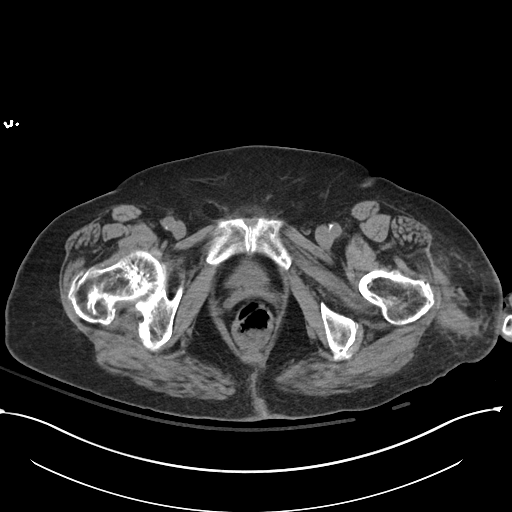
[im 22/97  soft-tissue]
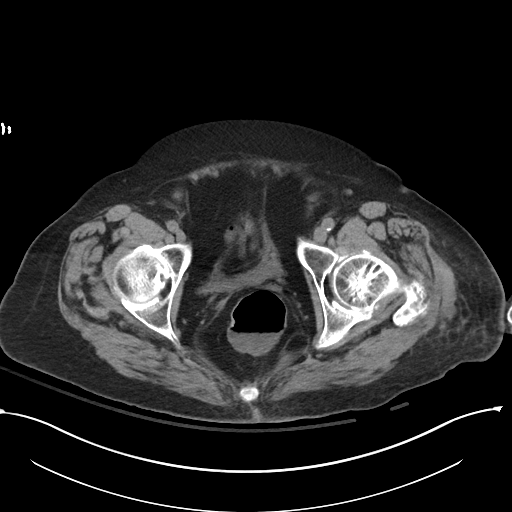
[im 27/97  soft-tissue]
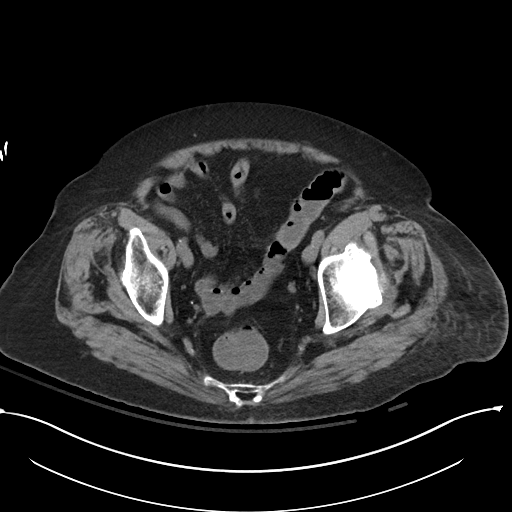
[im 38/97  soft-tissue]
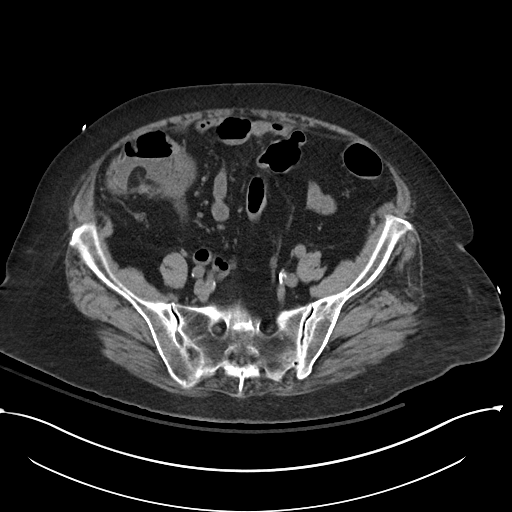
[im 43/97  soft-tissue]
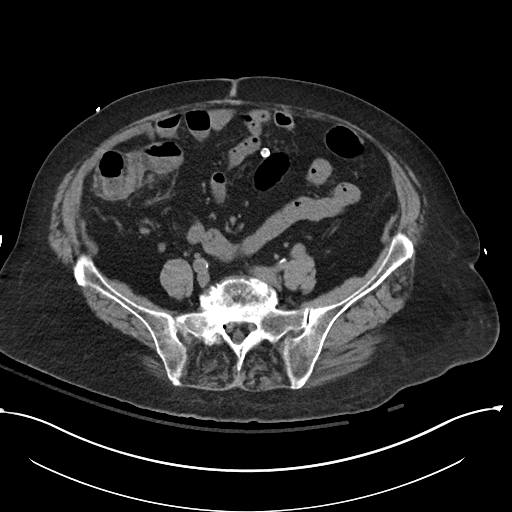
[im 54/97  soft-tissue]
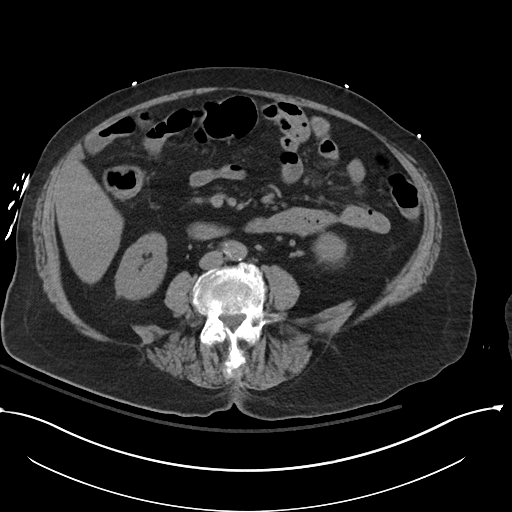
[im 59/97  soft-tissue]
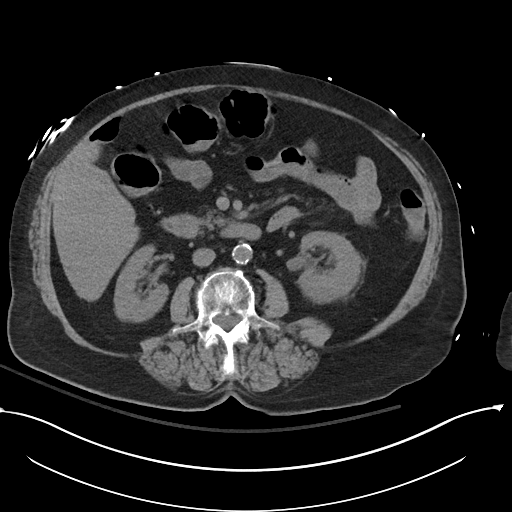
[im 70/97  soft-tissue]
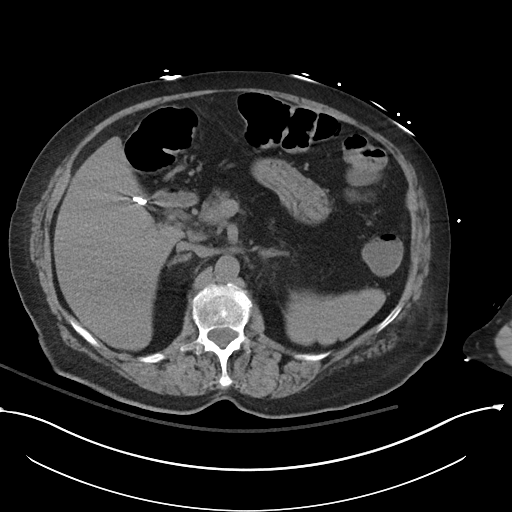
[im 70/97  bone]
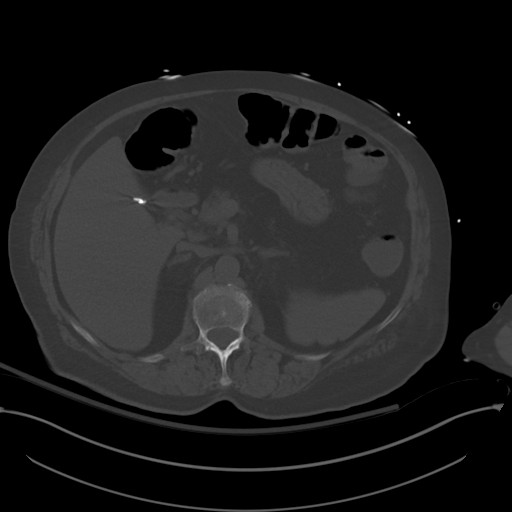
[im 75/97  soft-tissue]
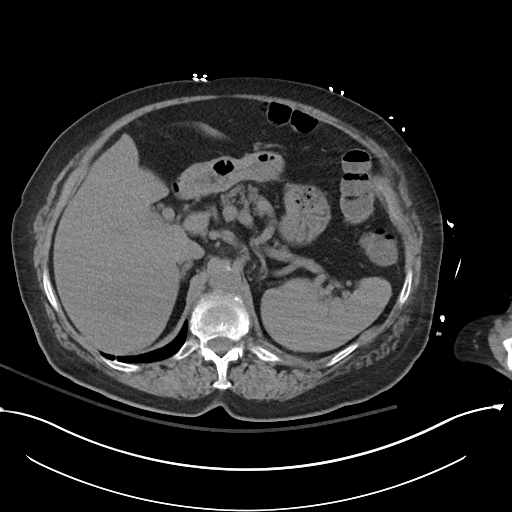
[im 81/97  soft-tissue]
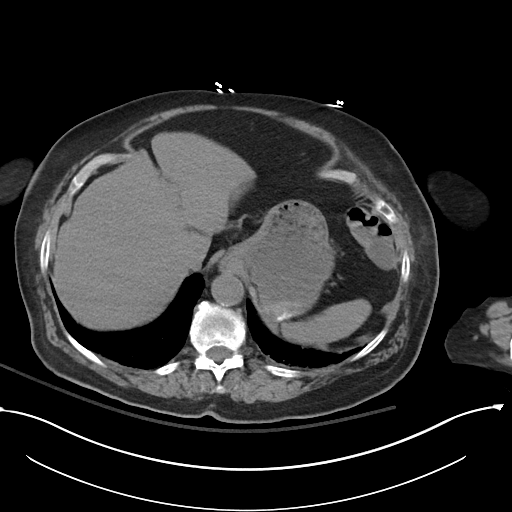
[im 91/97  soft-tissue]
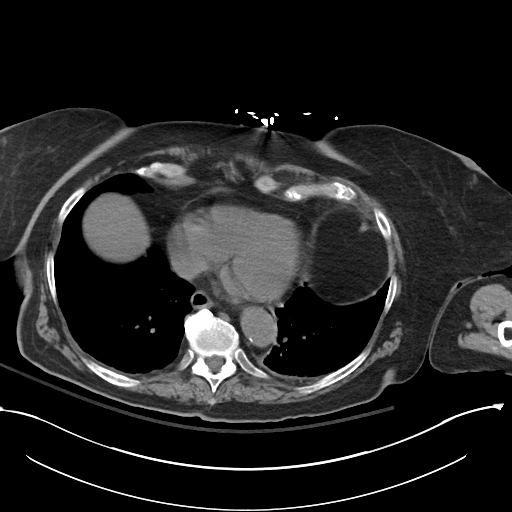

[Series 6: cor · coronal · 1.00mm/px · 3 of 105 slices shown]
[im 35/105  soft-tissue]
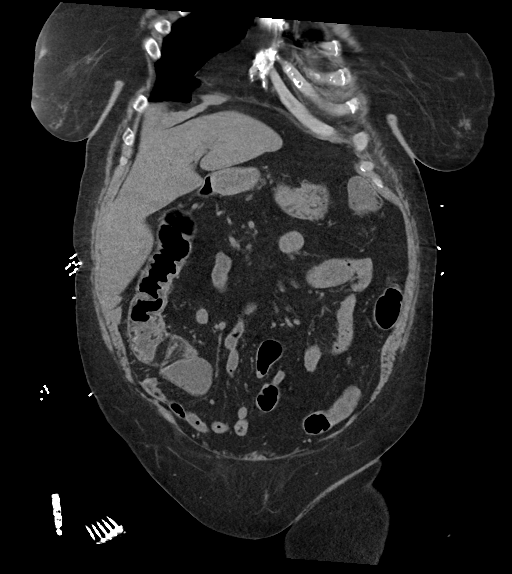
[im 47/105  soft-tissue]
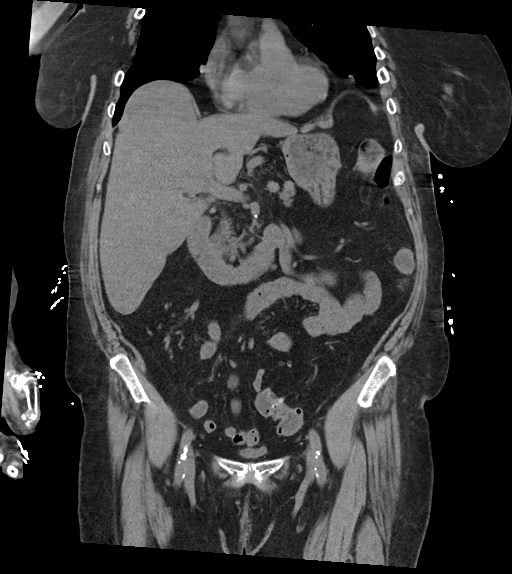
[im 58/105  soft-tissue]
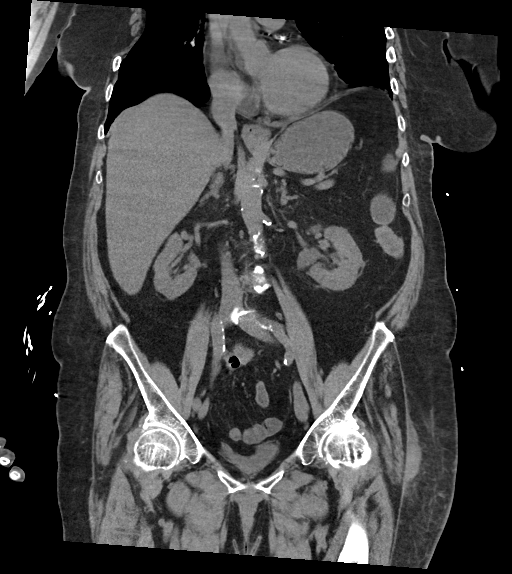

[15 of 46 positions shown; findings below may reference images not displayed]

FINDINGS: Lower chest: There are linear densities in the lower lung fields
suggesting scarring or subsegmental atelectasis.

Hepatobiliary: Liver measures 23.7 cm in length. No focal
abnormality is seen. There is no dilation of bile ducts. Surgical
clips are seen in gallbladder fossa.

Pancreas: No focal abnormality is seen.

Spleen: Unremarkable.

Adrenals/Urinary Tract: Adrenals are unremarkable. There is no
hydronephrosis. There are no renal or ureteral stones. Urinary
bladder is almost completely empty limiting evaluation.

Stomach/Bowel: Small hiatal hernia is seen. There is no significant
dilation of small-bowel loops. Appendix is not seen. There is fluid
in the lumen of colon and rectum. There is no significant wall
thickening in colon. Scattered diverticula are seen in the colon
without signs of focal acute diverticulitis.

Vascular/Lymphatic: Scattered arterial calcifications are seen.

Reproductive: Uterus is not seen.

Other: There is no ascites or pneumoperitoneum. There is 6.3 by 6 x
3.1 cm soft tissue density in the subcutaneous plane lateral to the
left hip. There is mild skin thickening in the same region. Small
bilateral inguinal hernias containing fat are noted.

Musculoskeletal: There is decrease in height upper endplates of
bodies of L1 and L3 vertebrae. Schmorl's node is seen in the upper
endplate of body of L3 vertebra. Degenerative changes are noted in
both hips, more so on the left side.
IMPRESSION: There is no evidence of intestinal obstruction or pneumoperitoneum.
There is no hydronephrosis.

There is liquid stool in the lumen of colon and rectum suggesting
possible nonspecific colitis. There is no significant wall
thickening in colon.

There is 6.3 x 6 x 3.1 cm soft tissue density structure with
irregular margins in the subcutaneous plane lateral to the left hip
along with skin thickening. This may suggest contusion and
subcutaneous hematoma. Less likely possibility would be infectious
or neoplastic process. There is no loculated fluid collection.

There is decrease in height of upper endplates of bodies of L1 and
L3 vertebrae suggesting recent or old compression fractures.

Small hiatal hernia.  Enlarged liver.  Diverticulosis of colon.

Other findings as described in the body of the report.

## 2023-09-01 IMAGING — CR DG HIP (WITH OR WITHOUT PELVIS) 2-3V*L*
1 series · 3 of 3 positions shown · non-contrast
Comparison: None

CLINICAL DATA: Fall, left hip pain

EXAM:
DG HIP (WITH OR WITHOUT PELVIS) 2-3V LEFT

[Series 1: dg hip unilat w or w/o pelvis 2-3 views  · non-contrast · 0.14mm/px · 3 of 3 slices shown]
[im 1/3]
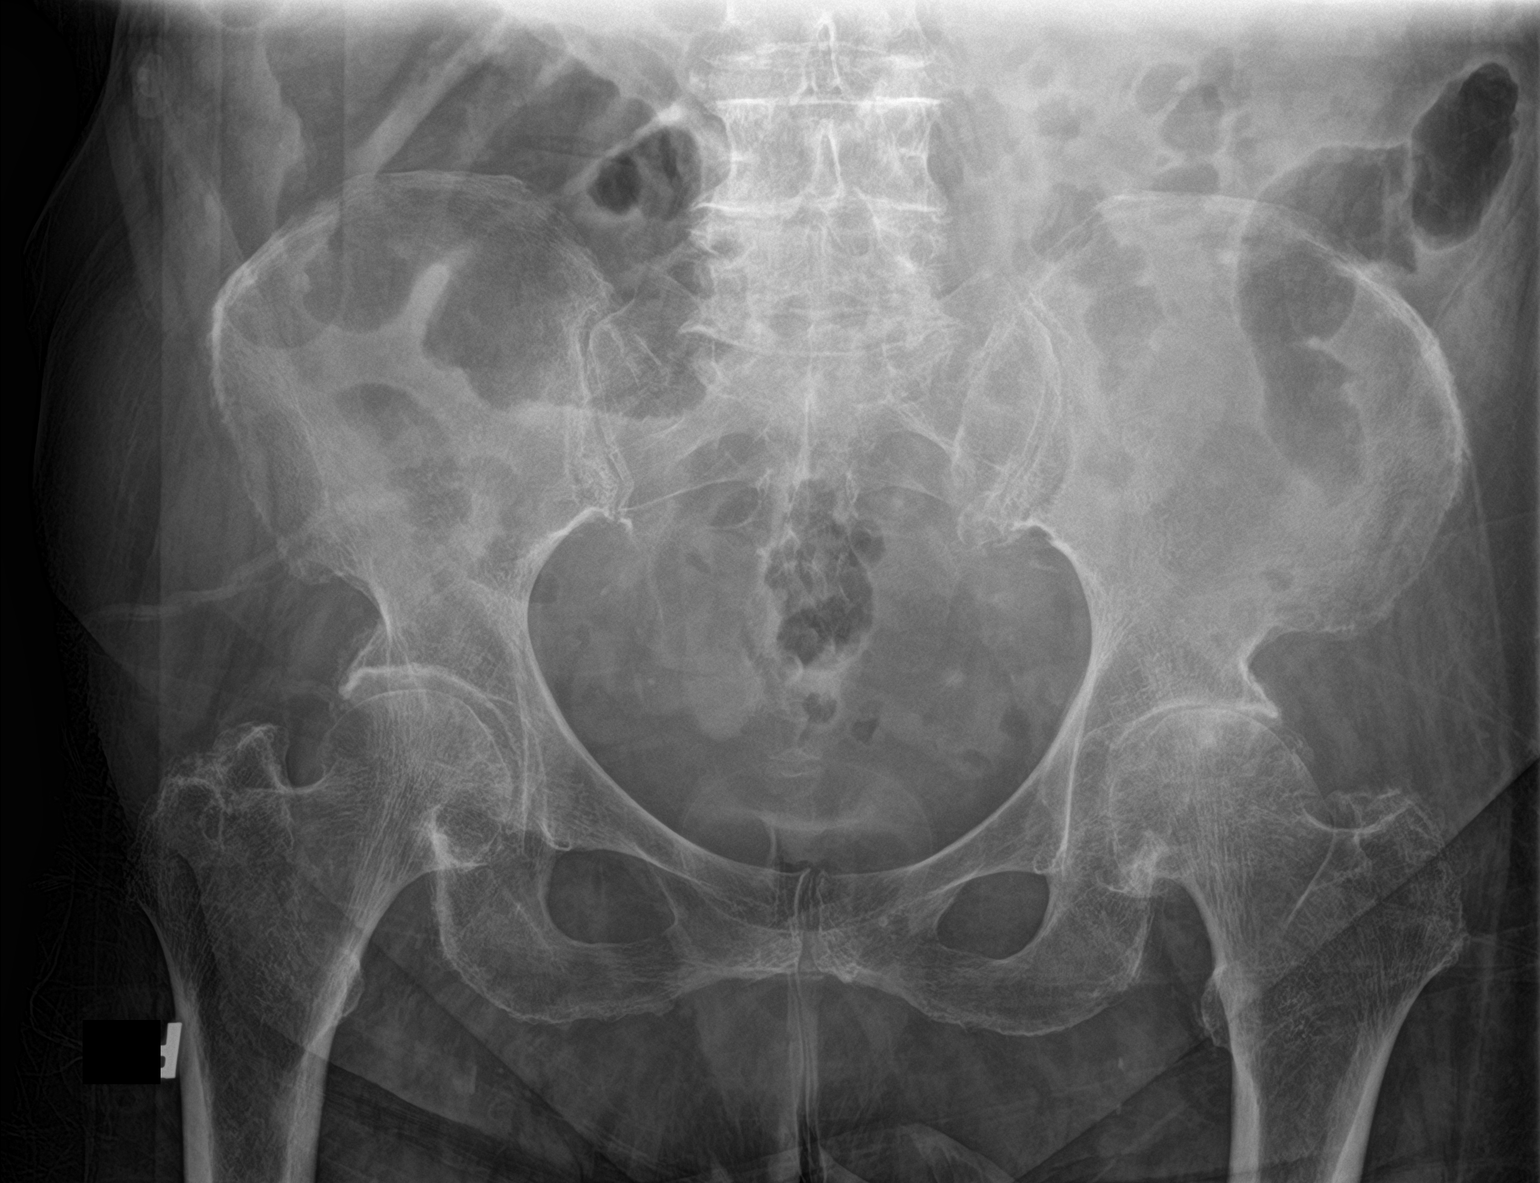
[im 2/3]
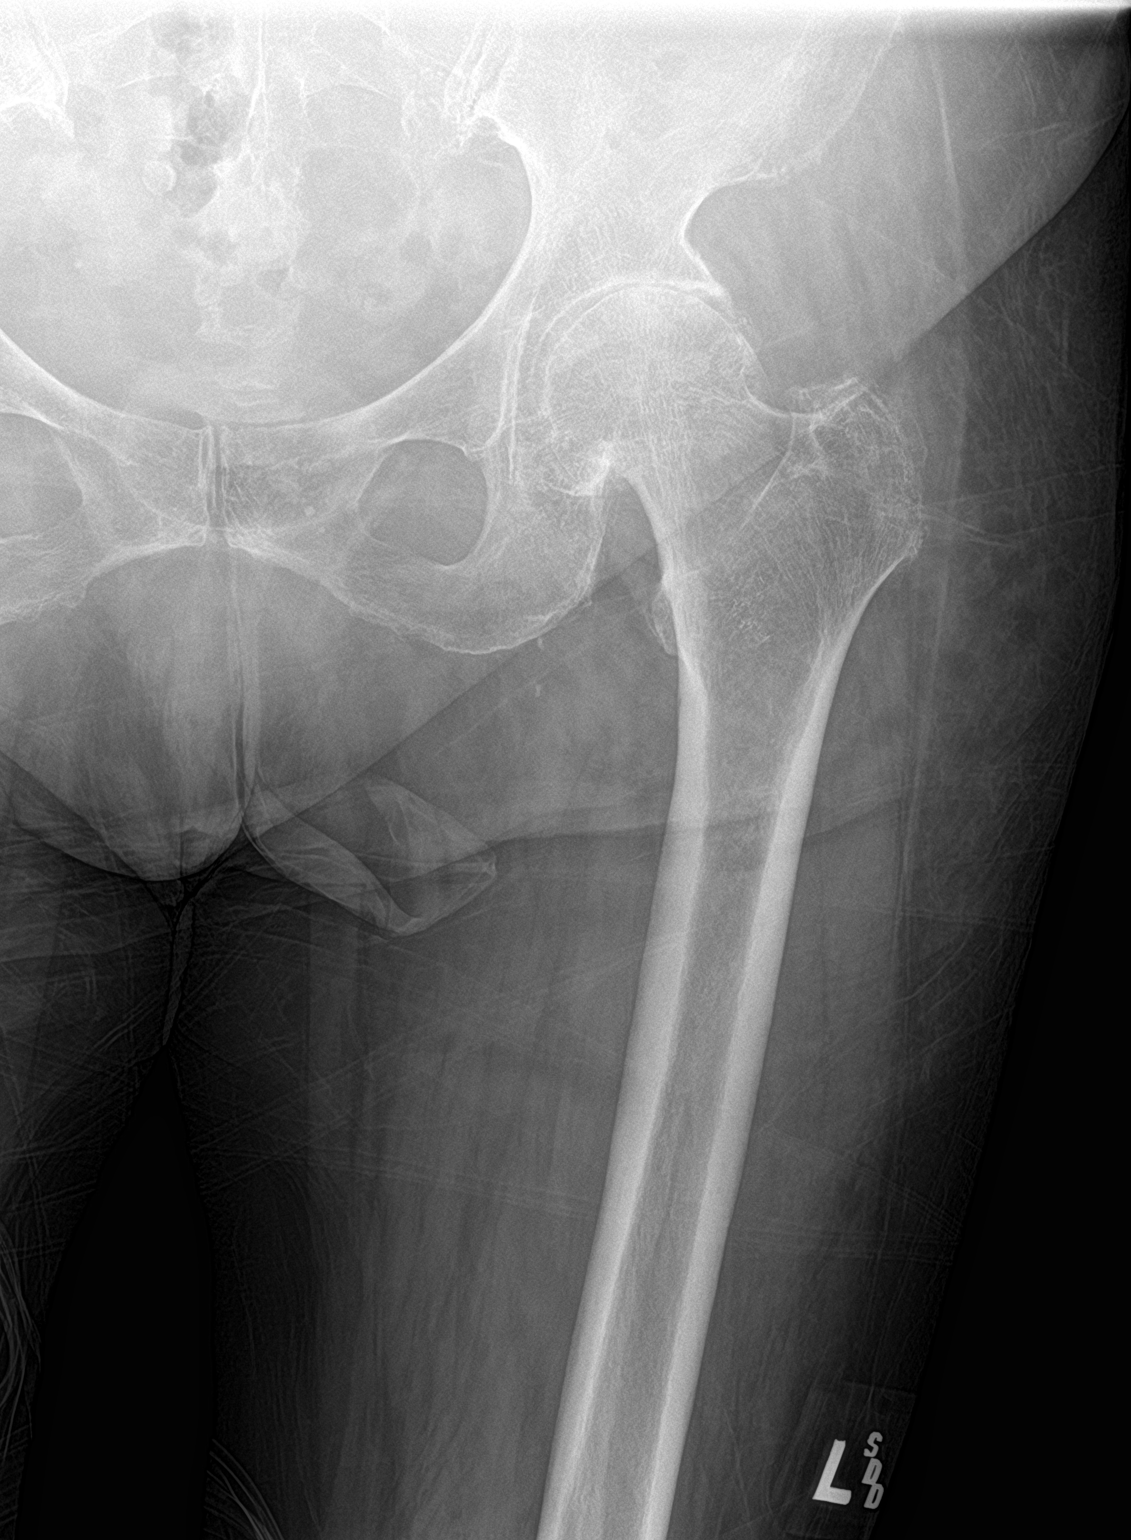
[im 3/3]
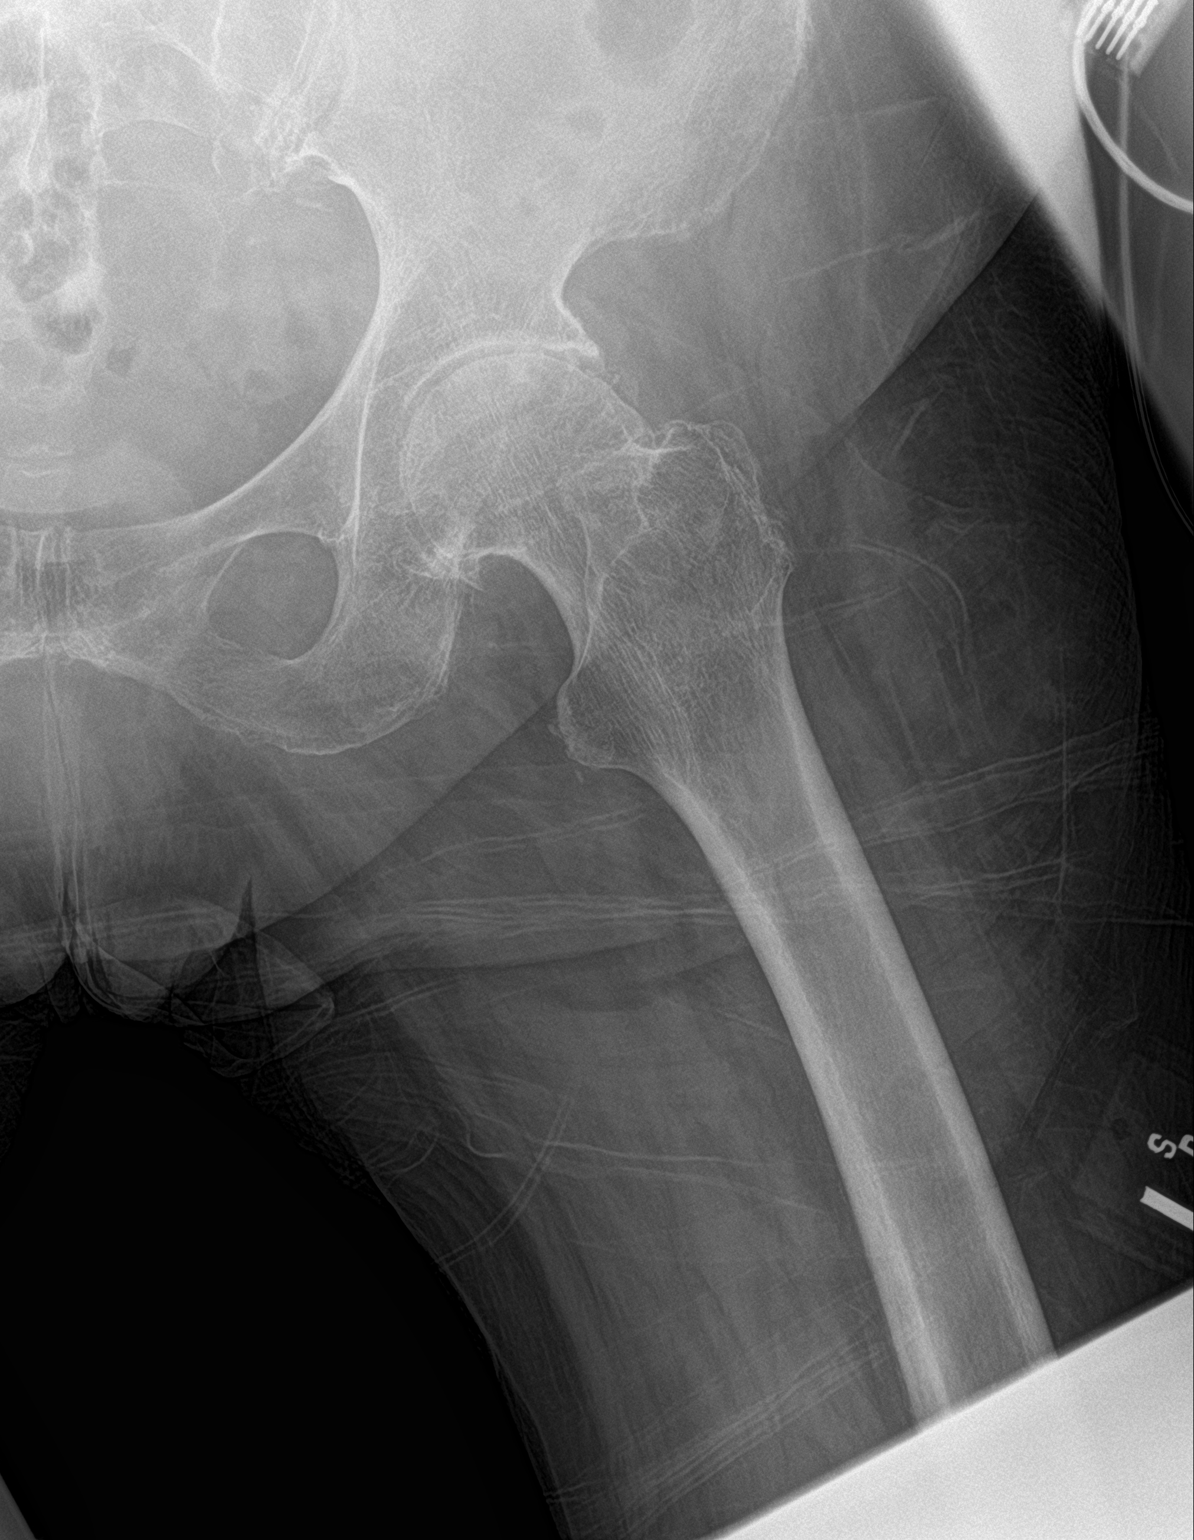

[3 of 3 positions shown; findings below may reference images not displayed]

FINDINGS: Degenerative changes in the hips with joint space narrowing and
spurring, left greater than right. No acute bony abnormality.
Specifically, no fracture, subluxation, or dislocation.
IMPRESSION: No acute bony abnormality.

## 2023-09-01 IMAGING — CT CT HEAD W/O CM
4 series · 16 of 47 positions shown, 18 images · non-contrast
Comparison: 03/10/2010

CLINICAL DATA: Altered mental status, nausea, vomiting, diarrhea x
6 days



[Series 2: head bone · axial · 0.39mm/px · z∈[-175,-143]mm · 3 of 83 slices shown]
[im 9/83  bone]
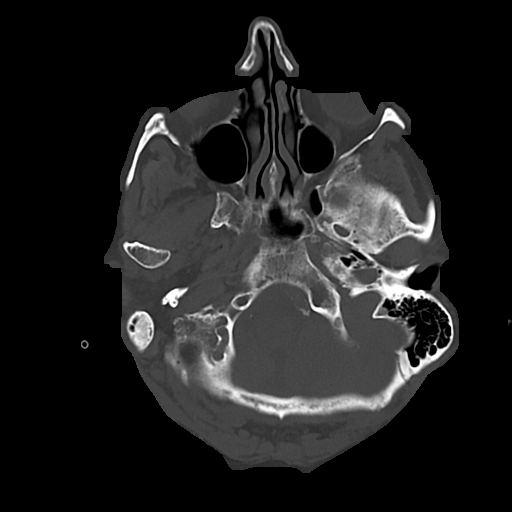
[im 17/83  bone]
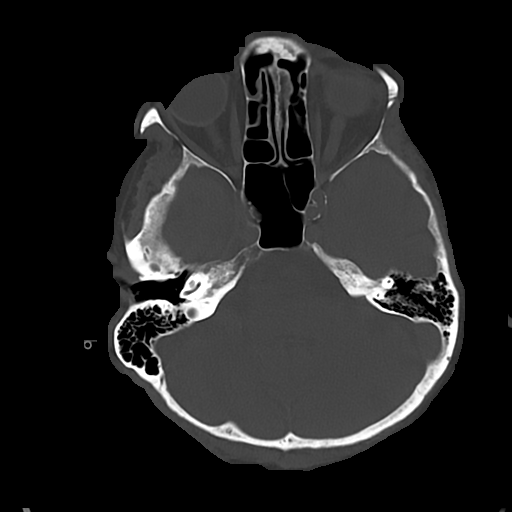
[im 25/83  bone]
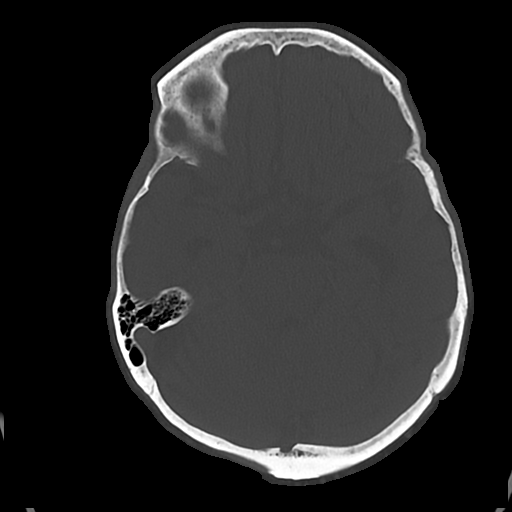

[Series 3: head wo · axial · 0.39mm/px · z∈[-171,-51]mm · 7 of 33 slices shown, 9 images]
[im 5/33  brain]
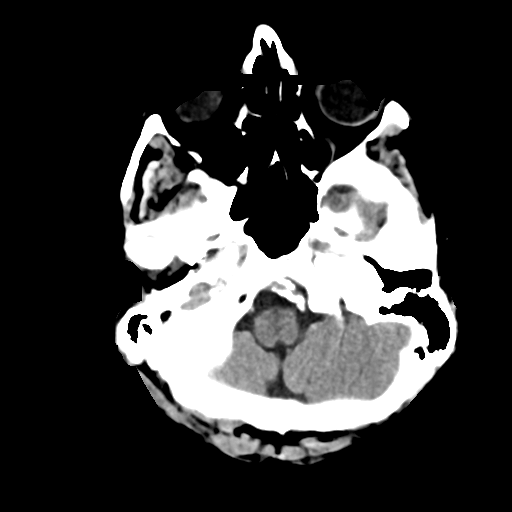
[im 5/33  bone]
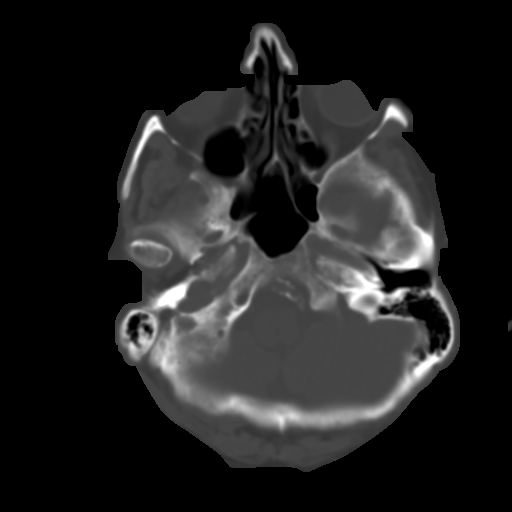
[im 9/33  brain]
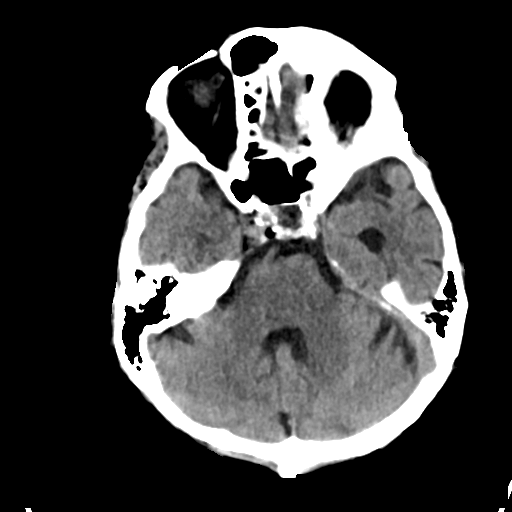
[im 13/33  brain]
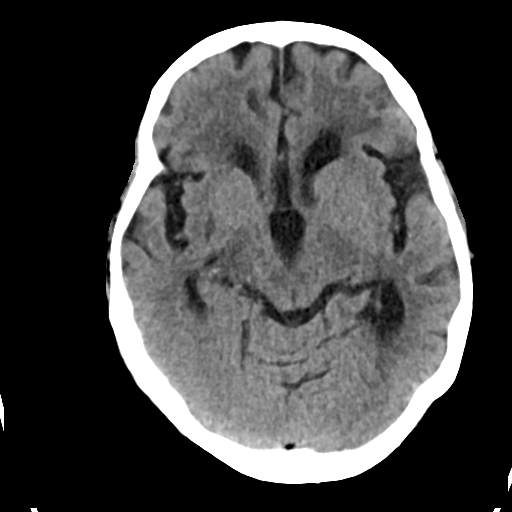
[im 17/33  brain]
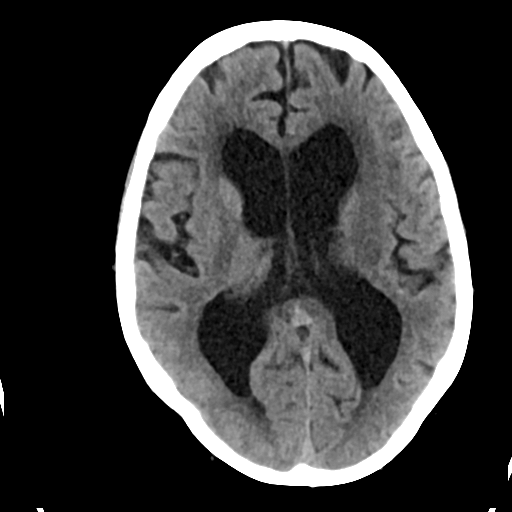
[im 21/33  brain]
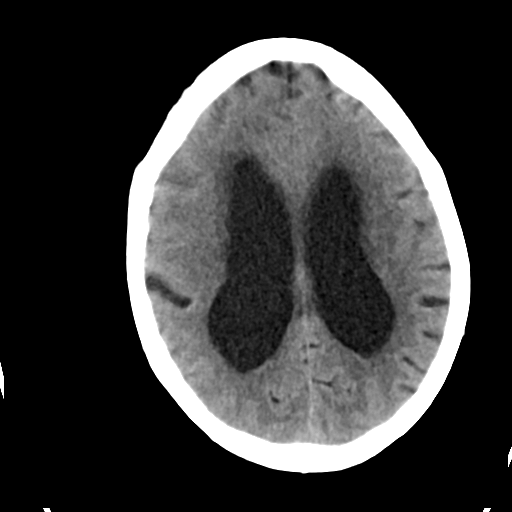
[im 21/33  bone]
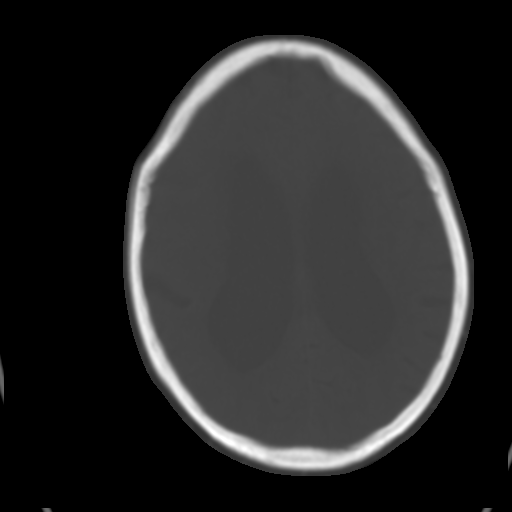
[im 25/33  brain]
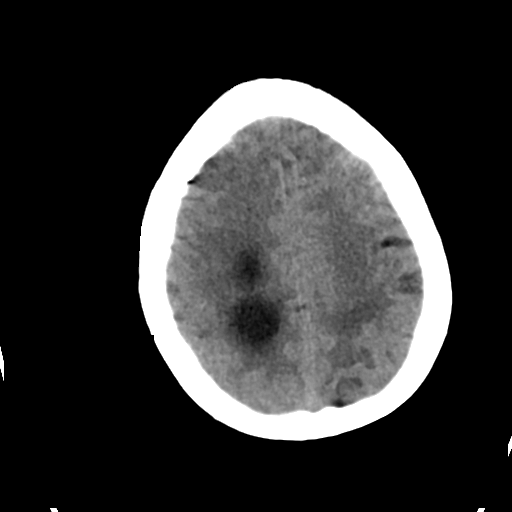
[im 29/33  brain]
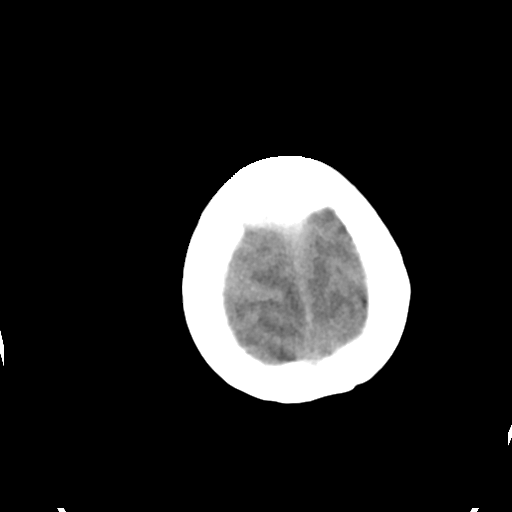

[Series 4: cor soft · coronal · 0.31mm/px · 3 of 67 slices shown]
[im 23/67  brain]
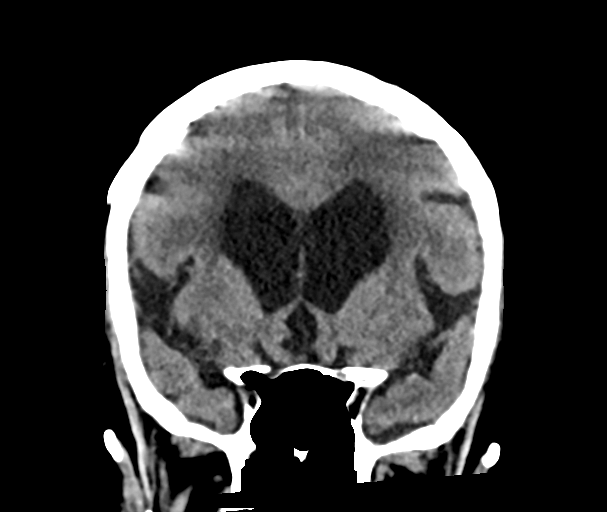
[im 30/67  brain]
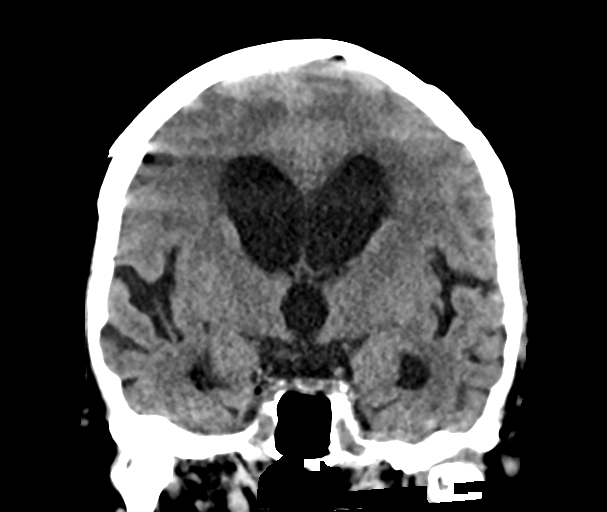
[im 37/67  brain]
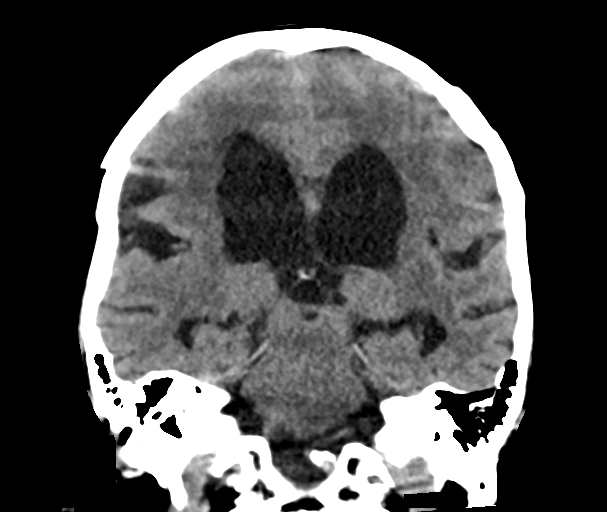

[Series 5: sag soft · sagittal · 0.31mm/px · 3 of 60 slices shown]
[im 20/60  brain]
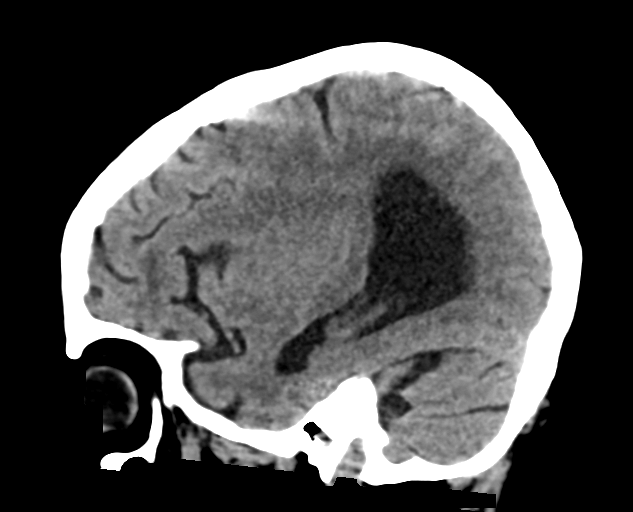
[im 30/60  brain]
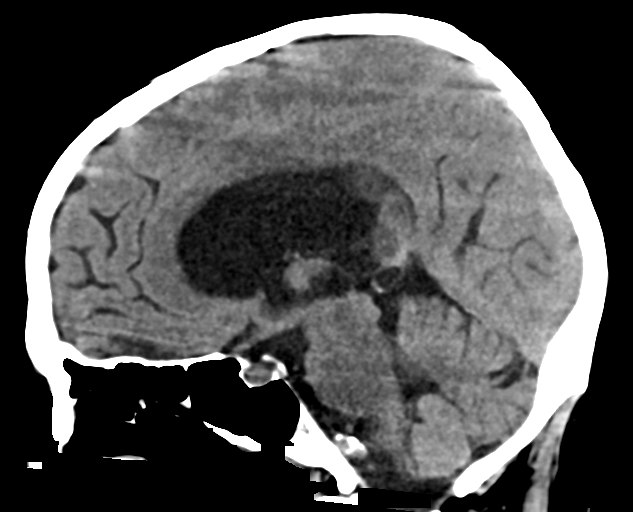
[im 40/60  brain]
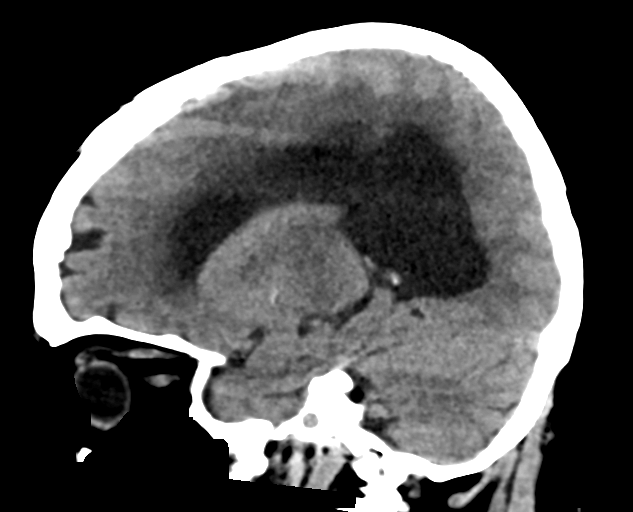

[16 of 47 positions shown; findings below may reference images not displayed]

FINDINGS: Brain: There is patient motion in the some of the images limiting
the study. There are no signs of bleeding within the cranium. There
is dilation of third and both lateral ventricles out of proportion
to cortical atrophy. There is decreased density in the
periventricular white matter. There is no shift of midline
structures. Basal ganglia calcifications are seen.

Vascular: Extensive arterial calcifications are seen.

Skull: Unremarkable.

Sinuses/Orbits: Unremarkable.

Other: None
IMPRESSION: No acute intracranial findings are seen in noncontrast CT brain.
There is dilation of third and both lateral ventricles out of
proportion to cortical atrophy suggesting possible normal pressure
hydrocephalus.

## 2023-09-02 DIAGNOSIS — I639 Cerebral infarction, unspecified: Secondary | ICD-10-CM | POA: Diagnosis not present

## 2023-09-22 DEATH — deceased
